# Patient Record
Sex: Male | Born: 1970 | Race: Black or African American | Hispanic: No | Marital: Single | Smoking: Current some day smoker
Health system: Southern US, Community
[De-identification: ages and names within clinical notes are randomized; demographics above are authoritative.]

## PROBLEM LIST (undated history)

## (undated) DIAGNOSIS — G4730 Sleep apnea, unspecified: Secondary | ICD-10-CM

## (undated) DIAGNOSIS — I219 Acute myocardial infarction, unspecified: Secondary | ICD-10-CM

## (undated) DIAGNOSIS — I10 Essential (primary) hypertension: Secondary | ICD-10-CM

## (undated) DIAGNOSIS — I2510 Atherosclerotic heart disease of native coronary artery without angina pectoris: Secondary | ICD-10-CM

## (undated) DIAGNOSIS — Z95810 Presence of automatic (implantable) cardiac defibrillator: Secondary | ICD-10-CM

## (undated) DIAGNOSIS — I639 Cerebral infarction, unspecified: Secondary | ICD-10-CM

## (undated) DIAGNOSIS — M1990 Unspecified osteoarthritis, unspecified site: Secondary | ICD-10-CM

## (undated) DIAGNOSIS — I509 Heart failure, unspecified: Secondary | ICD-10-CM

## (undated) HISTORY — PX: CORONARY ARTERY BYPASS GRAFT: SHX141

---

## 2011-07-11 ENCOUNTER — Emergency Department (HOSPITAL_COMMUNITY): Payer: Medicaid - Out of State

## 2011-07-11 ENCOUNTER — Encounter (HOSPITAL_COMMUNITY): Payer: Self-pay | Admitting: *Deleted

## 2011-07-11 ENCOUNTER — Emergency Department (HOSPITAL_COMMUNITY)
Admission: EM | Admit: 2011-07-11 | Discharge: 2011-07-12 | Disposition: A | Discharge status: Home / Self Care | Payer: Medicaid - Out of State | Attending: Emergency Medicine | Admitting: Emergency Medicine

## 2011-07-11 ENCOUNTER — Other Ambulatory Visit: Payer: Self-pay

## 2011-07-11 DIAGNOSIS — Z95810 Presence of automatic (implantable) cardiac defibrillator: Secondary | ICD-10-CM | POA: Insufficient documentation

## 2011-07-11 DIAGNOSIS — Z794 Long term (current) use of insulin: Secondary | ICD-10-CM | POA: Insufficient documentation

## 2011-07-11 DIAGNOSIS — E7800 Pure hypercholesterolemia, unspecified: Secondary | ICD-10-CM | POA: Insufficient documentation

## 2011-07-11 DIAGNOSIS — I10 Essential (primary) hypertension: Secondary | ICD-10-CM | POA: Insufficient documentation

## 2011-07-11 DIAGNOSIS — R739 Hyperglycemia, unspecified: Secondary | ICD-10-CM

## 2011-07-11 DIAGNOSIS — E1169 Type 2 diabetes mellitus with other specified complication: Secondary | ICD-10-CM | POA: Insufficient documentation

## 2011-07-11 DIAGNOSIS — F17200 Nicotine dependence, unspecified, uncomplicated: Secondary | ICD-10-CM | POA: Insufficient documentation

## 2011-07-11 DIAGNOSIS — R0789 Other chest pain: Secondary | ICD-10-CM | POA: Insufficient documentation

## 2011-07-11 DIAGNOSIS — I2510 Atherosclerotic heart disease of native coronary artery without angina pectoris: Secondary | ICD-10-CM | POA: Insufficient documentation

## 2011-07-11 HISTORY — DX: Atherosclerotic heart disease of native coronary artery without angina pectoris: I25.10

## 2011-07-11 LAB — CBC
Platelets: 173 10*3/uL (ref 150–400)
RDW: 13.3 % (ref 11.5–15.5)
WBC: 9.9 10*3/uL (ref 4.0–10.5)

## 2011-07-11 LAB — COMPREHENSIVE METABOLIC PANEL
ALT: 19 U/L (ref 0–53)
AST: 15 U/L (ref 0–37)
Albumin: 3.8 g/dL (ref 3.5–5.2)
CO2: 27 mEq/L (ref 19–32)
Calcium: 9.4 mg/dL (ref 8.4–10.5)
Chloride: 96 mEq/L (ref 96–112)
GFR calc non Af Amer: >90 mL/min (ref >90)
Sodium: 133 mEq/L — ABNORMAL LOW (ref 135–145)
Total Bilirubin: 0.4 mg/dL (ref 0.3–1.2)

## 2011-07-11 LAB — DIFFERENTIAL
Basophils Absolute: 0 10*3/uL (ref 0.0–0.1)
Basophils Relative: 0 % (ref 0–1)
Lymphocytes Relative: 33 % (ref 12–46)
Neutro Abs: 5.5 10*3/uL (ref 1.7–7.7)

## 2011-07-11 LAB — CK TOTAL AND CKMB (NOT AT ARMC): Relative Index: INVALID (ref 0.0–2.5)

## 2011-07-11 LAB — POCT I-STAT TROPONIN I

## 2011-07-11 MED ORDER — SODIUM CHLORIDE 0.9 % IV BOLUS (SEPSIS)
1000.0000 mL | Freq: Once | INTRAVENOUS | Status: AC
  Administered 2011-07-12: 1000 mL via INTRAVENOUS

## 2011-07-11 MED ORDER — KETOROLAC TROMETHAMINE 30 MG/ML IJ SOLN
30.0000 mg | Freq: Once | INTRAMUSCULAR | Status: AC
  Administered 2011-07-12: 30 mg via INTRAVENOUS
  Filled 2011-07-11: qty 1

## 2011-07-11 MED ORDER — INSULIN ASPART 100 UNIT/ML ~~LOC~~ SOLN
10.0000 [IU] | Freq: Once | SUBCUTANEOUS | Status: AC
  Administered 2011-07-12: 100 [IU] via SUBCUTANEOUS
  Filled 2011-07-11: qty 1

## 2011-07-11 NOTE — ED Provider Notes (Addendum)
History     CSN: [id number]  Arrival date & time 07/11/11  2215   First MD Initiated Contact with Patient 07/11/11 2327      Chief Complaint  Patient presents with  . Chest Pain    (Consider location/radiation/quality/duration/timing/severity/associated sxs/prior treatment) HPI Comments: Pt had hx of severe arrhythmia and was placed in hospital at [facility] in end of 11/12, had defib placed due to arrhythmia and has since done well, no shocks but developed burning senstation in upper L chest 3 weeks ago which he descxribes as constant, 8/10 in intensity, burning in nature, constnat and not associated with exertional sx, SOB, dizzyness, swelling or n/v/f/c/cough.  He has tried OTC meds without reliev.  He has recently moved here.  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  This pain is made worse with movement of the L arm.  Patient is a 41 y.o. male presenting with chest pain. The history is provided by the patient and a relative.  Chest Pain     Past Medical History  Diagnosis Date  . Coronary artery disease   . Defibrillator activation   . Diabetes mellitus     History reviewed. No pertinent past surgical history.  History reviewed. No pertinent family history.  History  Substance Use Topics  . Smoking status: Current Everyday Smoker  . Smokeless tobacco: Not on file  . Alcohol Use: No      Review of Systems  Cardiovascular: Positive for chest pain.  All other systems reviewed and are negative.    Allergies  Review of patient's allergies indicates no known allergies.  Home Medications   Current Outpatient Rx  Name Route Sig Dispense Refill  . ATORVASTATIN CALCIUM 10 MG PO TABS Oral Take 10 mg by mouth at bedtime.    . FUROSEMIDE 80 MG PO TABS Oral Take 80 mg by mouth daily.     . INSULIN ASPART 100 UNIT/ML [place] SOLN Subcutaneous Inject 2.5 Units into the skin 2 (two) times daily. Bases on sliding scale depending on how high glucose level is.    Marland Kitchen LOSARTAN POTASSIUM 50 MG PO TABS Oral Take 50 mg by mouth daily.    Marland Kitchen MAGNESIUM OXIDE 400 MG PO TABS Oral Take 400 mg by mouth 2 (two) times daily.    Marland Kitchen METOPROLOL TARTRATE 25 MG PO TABS Oral Take 25 mg by mouth 2 (two) times daily.    . SERTRALINE HCL 50 MG PO TABS Oral Take 50 mg by mouth at bedtime.      BP 122/79  Pulse 95  Temp(Src) 98.6 F (37 C) (Oral)  Resp 18  Ht 5\' 9"  (1.753 m)  Wt 230 lb (104.327 kg)  BMI 33.96 kg/m2  SpO2 100%  Physical Exam  Nursing note and vitals reviewed. Constitutional: He appears well-developed and well-nourished. No distress.  HENT:  Head: Normocephalic and atraumatic.  Mouth/Throat: Oropharynx is clear and moist. No oropharyngeal exudate.  Eyes: Conjunctivae and EOM are normal. Pupils are equal, round, and reactive to light. Right eye exhibits no discharge. Left eye exhibits no discharge. No scleral icterus.  Neck: Normal range of motion. Neck supple. No JVD present. No thyromegaly present.  Cardiovascular: Normal rate, regular rhythm, normal heart sounds and intact distal pulses.  Exam reveals no gallop and  no friction rub.   No murmur heard. Pulmonary/Chest: Effort normal and breath sounds normal. No respiratory distress. He has no wheezes. He has no rales. He exhibits tenderness ( ttp ove the upper L chest wall, simulates the pain he has had at rest.).  Abdominal: Soft. Bowel sounds are normal. He exhibits no distension and no mass. There is no tenderness.  Musculoskeletal: Normal range of motion. He exhibits no edema and no tenderness.  Lymphadenopathy:    He has no cervical adenopathy.  Neurological: He is alert. Coordination normal.  Skin: Skin is warm and dry. No rash noted. No erythema.  Psychiatric: He has a normal mood and affect. His behavior is normal.    ED  Course  Procedures (including critical care time)  Labs Reviewed  COMPREHENSIVE METABOLIC PANEL - Abnormal; Notable for the following:    Sodium 133 (*)    Glucose, Bld 416 (*)    Alkaline Phosphatase 134 (*)    All other components within normal limits  GLUCOSE, CAPILLARY - Abnormal; Notable for the following:    Glucose-Capillary 127 (*)    All other components within normal limits  CBC  DIFFERENTIAL  CK TOTAL AND CKMB  POCT I-STAT TROPONIN I  I-STAT TROPONIN I   Dg Chest Port 1 View  07/11/2011  *RADIOLOGY REPORT*  Clinical Data: Left-sided chest pain, about the AICD, with burning and tenderness.  PORTABLE CHEST - 1 VIEW  Comparison: None.  Findings: The lungs are well-aerated and clear.  There is no evidence of focal opacification, pleural effusion or pneumothorax.  The cardiomediastinal silhouette is within normal limits.  An AICD is noted overlying the left chest wall, with a single lead ending overlying the right ventricle.  No acute osseous abnormalities are seen.  IMPRESSION:  1.  No acute cardiopulmonary process seen. 2.  AICD grossly unremarkable in appearance on radiograph.  Original Report Authenticated By: Tonia Ghent, M.D.     1. Hyperglycemia   2. Chest wall pain       MDM  Pt reports severe CAD but his sx are more applicable to neuropathic pain at the incision site from the def placement.  There are no other findings on phyical exam, the ECG show some TWA in inferior and lateral precordial leads but no sig ST elevation or depression.  Trop is negative, CBC is normal, CMP with hyperglycemia at 416, no sig AG.  CXR without complications of the AICD.  Consult Cardiology to arrange f/u.    D/w Cardiology fellow who will pass on contact information to help Mr. Jiminez establish local EP care.  He agrees that pt's pain is unlikely to be cardiac in nature, especially with CP X 3 weeks that is burning and worse with palpation of chest wall and movement of the L arm  but no exertional.  Fluids and insulin, pain meds and home.  After intravenous fluids and intravenous insulin, patient has a blood sugar in the 100 range, feels good, discharge appropriate at this time.  Vida Roller, MD 07/12/11 0202  ED ECG REPORT   Date: 07/12/2011   Rate: 102  Rhythm: sinus tachycardia  QRS Axis: normal  Intervals: IVCD at 102  ST/T Wave abnormalities: nonspecific T wave changes  Conduction Disutrbances:nonspecific intraventricular conduction delay  Narrative Interpretation: no old ECG  Old EKG Reviewed: none available   Vida Roller, MD 07/12/11 [phone]

## 2011-07-11 NOTE — ED Notes (Signed)
The pt has lt upper chest pain where he had a defib  Placed nov 27th.  He has had pain around the incision for 2 weeks

## 2011-07-12 LAB — GLUCOSE, CAPILLARY: Glucose-Capillary: 127 mg/dL — ABNORMAL HIGH (ref 70–99)

## 2011-07-12 MED ORDER — ALPRAZOLAM 0.5 MG PO TABS: 0.5000 mg | ORAL_TABLET | Freq: Three times a day (TID) | ORAL | Status: AC | PRN

## 2011-07-12 MED ORDER — HYDROCODONE-ACETAMINOPHEN 5-500 MG PO TABS: 1.0000 | ORAL_TABLET | Freq: Four times a day (QID) | ORAL | Status: AC | PRN

## 2011-07-12 NOTE — ED Notes (Signed)
Pt ambulated to bathroom 

## 2021-09-16 ENCOUNTER — Emergency Department (HOSPITAL_COMMUNITY): Payer: Medicaid Other

## 2021-09-16 ENCOUNTER — Other Ambulatory Visit: Payer: Self-pay

## 2021-09-16 ENCOUNTER — Emergency Department (HOSPITAL_COMMUNITY)
Admission: EM | Admit: 2021-09-16 | Discharge: 2021-09-16 | Disposition: A | Discharge status: Home / Self Care | Payer: Medicaid Other | Attending: Emergency Medicine | Admitting: Emergency Medicine

## 2021-09-16 DIAGNOSIS — E119 Type 2 diabetes mellitus without complications: Secondary | ICD-10-CM | POA: Insufficient documentation

## 2021-09-16 DIAGNOSIS — Z7984 Long term (current) use of oral hypoglycemic drugs: Secondary | ICD-10-CM | POA: Insufficient documentation

## 2021-09-16 DIAGNOSIS — M169 Osteoarthritis of hip, unspecified: Secondary | ICD-10-CM | POA: Insufficient documentation

## 2021-09-16 DIAGNOSIS — Z794 Long term (current) use of insulin: Secondary | ICD-10-CM | POA: Diagnosis not present

## 2021-09-16 DIAGNOSIS — M25551 Pain in right hip: Secondary | ICD-10-CM | POA: Diagnosis present

## 2021-09-16 DIAGNOSIS — Z7982 Long term (current) use of aspirin: Secondary | ICD-10-CM | POA: Diagnosis not present

## 2021-09-16 DIAGNOSIS — Z95810 Presence of automatic (implantable) cardiac defibrillator: Secondary | ICD-10-CM | POA: Diagnosis not present

## 2021-09-16 DIAGNOSIS — M1611 Unilateral primary osteoarthritis, right hip: Secondary | ICD-10-CM

## 2021-09-16 MED ORDER — MORPHINE SULFATE (PF) 4 MG/ML IV SOLN
6.0000 mg | Freq: Once | INTRAVENOUS | Status: AC
  Administered 2021-09-16: 6 mg via INTRAMUSCULAR
  Filled 2021-09-16: qty 2

## 2021-09-16 MED ORDER — ACETAMINOPHEN 500 MG PO TABS
1000.0000 mg | ORAL_TABLET | Freq: Once | ORAL | Status: AC
Start: 2021-09-16 — End: 2021-09-16
  Administered 2021-09-16: 1000 mg via ORAL
  Filled 2021-09-16: qty 2

## 2021-09-16 NOTE — ED Notes (Signed)
Pt walked well from waiting room to exam room. 

## 2021-09-16 NOTE — ED Triage Notes (Signed)
Pt. Stated, when I walk my chest hurts too. ?

## 2021-09-16 NOTE — Discharge Instructions (Signed)
Follow-up with your pain management physician and orthopedics for worsening hip arthritis. ?Return for fevers, leg weakness, unable to ambulate or new concerns. ?Your x-ray showed significant arthritis, no broken bones or dislocation seen. ? ?

## 2021-09-16 NOTE — ED Provider Notes (Signed)
?[facility] ?Provider Note ? ? ?CSN: [id number] ?Arrival date & time: 09/16/21  [id number] ? ?  ? ?History ? ?Chief Complaint  ?Patient presents with  ? Hip Pain  ? ? ?Michael Barnett is a 51 y.o. male. ? ?Patient presents with gradually worsening right hip pain over the past 2 weeks.  Patient is followed closely by cardiology and has defibrillator and is hoping for heart transplant in the near future.  Patient recalls walking longer distance and upstairs when watching a sporting event recently and his pain is gotten significantly worse since then.  No direct trauma or falls.  Patient felt some slipping of his hip however no dislocation.  No fevers or chills.  Mild radiation of pain with movement.  Similar to previous but more significant.  Patient was told he needs a hip replacement.  ? ? ?  ? ?Home Medications ?Prior to Admission medications   ?Medication Sig Start Date End Date Taking? Authorizing Provider  ?aspirin EC 81 MG tablet Take 81 mg by mouth daily. Swallow whole.   Yes [provider]  ?atorvastatin (LIPITOR) 80 MG tablet Take 80 mg by mouth daily.   Yes [provider]  ?DULoxetine (CYMBALTA) 20 MG capsule Take 20 mg by mouth daily.   Yes [provider]  ?empagliflozin (JARDIANCE) 10 MG TABS tablet Take 10 mg by mouth daily.   Yes [provider]  ?ezetimibe (ZETIA) 10 MG tablet Take 10 mg by mouth daily.   Yes [provider]  ?insulin aspart (NOVOLOG) 100 UNIT/ML injection Inject 7 Units into the skin 2 (two) times daily.   Yes [provider]  ?insulin glargine (LANTUS) 100 unit/mL SOPN Inject 30 Units into the skin 2 (two) times daily.   Yes [provider]  ?isosorbide mononitrate (IMDUR) 30 MG 24 hr tablet Take 60 mg by mouth daily.   Yes [provider]  ?methocarbamol (ROBAXIN) 500 MG tablet Take 500 mg by mouth daily as needed for muscle spasms.   Yes [provider]   ?nitroGLYCERIN (NITROSTAT) 0.4 MG SL tablet Place 0.4 mg under the tongue every 5 (five) minutes as needed for chest pain.   Yes [provider]  ?oxyCODONE-acetaminophen (PERCOCET) 10-325 MG tablet Take 1 tablet by mouth daily as needed for pain.   Yes [provider]  ?pantoprazole (PROTONIX) 40 MG tablet Take 40 mg by mouth daily.   Yes [provider]  ?sacubitril-valsartan (ENTRESTO) 24-26 MG Take 1 tablet by mouth 2 (two) times daily.   Yes [provider]  ?sotalol (BETAPACE) 120 MG tablet Take 120 mg by mouth 2 (two) times daily.   Yes [provider]  ?spironolactone (ALDACTONE) 25 MG tablet Take 12.5 mg by mouth daily.   Yes [provider]  ?tamsulosin (FLOMAX) 0.4 MG CAPS capsule Take 0.4 mg by mouth daily.   Yes [provider]  ?torsemide (DEMADEX) 20 MG tablet Take 20 mg by mouth daily as needed (swelling).   Yes [provider]  ?   ? ?Allergies    ?Victoza [liraglutide]   ? ?Review of Systems   ?Review of Systems  ?Constitutional:  Negative for chills and fever.  ?HENT:  Negative for congestion.   ?Eyes:  Negative for visual disturbance.  ?Respiratory:  Negative for shortness of breath.   ?Cardiovascular:  Negative for chest pain.  ?Gastrointestinal:  Negative for abdominal pain and vomiting.  ?Genitourinary:  Negative for dysuria and flank pain.  ?  Musculoskeletal:  Positive for gait problem. Negative for back pain, joint swelling, neck pain and neck stiffness.  ?Skin:  Negative for rash.  ?Neurological:  Negative for light-headedness and headaches.  ? ?Physical Exam ?Updated Vital Signs ?BP 131/77   Pulse 63   Temp 98.1 ?F (36.7 ?C) (Oral)   Resp 16   SpO2 99%  ?Physical Exam ?Vitals and nursing note reviewed.  ?Constitutional:   ?   General: He is not in acute distress. ?   Appearance: He is well-developed.  ?HENT:  ?   Head: Normocephalic.  ?   Mouth/Throat:  ?   Mouth: Mucous membranes are moist.  ?Eyes:  ?   General:      ?   Right eye: No discharge.     ?   Left eye: No discharge.  ?   Conjunctiva/sclera: Conjunctivae normal.  ?Neck:  ?   Trachea: No tracheal deviation.  ?Cardiovascular:  ?   Rate and Rhythm: Normal rate.  ?Pulmonary:  ?   Effort: Pulmonary effort is normal.  ?Abdominal:  ?   General: There is no distension.  ?   Palpations: Abdomen is soft.  ?   Tenderness: There is no abdominal tenderness. There is no guarding.  ?Musculoskeletal:     ?   General: Tenderness present. No swelling.  ?   Cervical back: Normal range of motion and neck supple. No rigidity.  ?   Comments: Patient has tenderness with external rotation and hip flexion on the right.  No other bony tenderness to lower extremity.  Normal strength of flexion extension of hips knees and ankle on the right.  2+ pulses distal right leg.  ?Skin: ?   General: Skin is warm.  ?   Capillary Refill: Capillary refill takes less than 2 seconds.  ?Neurological:  ?   General: No focal deficit present.  ?   Mental Status: He is alert and oriented to person, place, and time.  ?Psychiatric:     ?   Mood and Affect: Mood normal.  ? ? ?ED Results / Procedures / Treatments   ?Labs ?(all labs ordered are listed, but only abnormal results are displayed) ?Labs Reviewed - No data to display ? ?EKG ?None ? ?Radiology ?DG HIP UNILAT WITH PELVIS 2-3 VIEWS RIGHT ? ?Result Date: 09/16/2021 ?CLINICAL DATA:  Right hip pain for 2 weeks. EXAM: DG HIP (WITH OR WITHOUT PELVIS) 2-3V RIGHT COMPARISON:  None. FINDINGS: Moderate right femoroacetabular joint space narrowing. Mild-to-moderate right femoral head-neck junction and peripheral acetabular degenerative osteophytes. On frontal view, similar moderate left femoroacetabular osteoarthritis. The bilateral sacroiliac and pubic symphysis joint spaces are maintained. There are vascular calcifications. Seminal vesicle calcifications are also seen, as can be seen with chronic diabetes. IMPRESSION: Moderate bilateral femoroacetabular osteoarthritis.  Electronically Signed   By: Yvonne Kendall M.D.   On: 09/16/2021 14:05   ? ?Procedures ?Procedures  ? ? ?Medications Ordered in ED ?Medications  ?morphine (PF) 4 MG/ML injection 6 mg (6 mg Intramuscular Given 09/16/21 1411)  ?acetaminophen (TYLENOL) tablet 1,000 mg (1,000 mg Oral Given 09/16/21 1410)  ? ? ?ED Course/ Medical Decision Making/ A&P ?  ?                        ?Medical Decision Making ?Amount and/or Complexity of Data Reviewed ?Radiology: ordered. ? ?Risk ?OTC drugs. ?Prescription drug management. ? ? ?Patient with known significant arthritis and follow-up for future hip replacement presents with worsening acute on  chronic right hip pain.  Patient has a pain medicine physician however sent in for evaluation.  Patient has no clinical signs of infection at this time not concern for osteomyelitis or septic hip.  Patient's story is consistent with acute on chronic worsening/inflammation.  X-ray performed to look for any occult fracture, reviewed independently and no acute dislocation or fracture but does show arthritis.  Pain meds given intramuscular morphine.  Patient has outpatient pain meds.  Patient stable for discharge. ? ? ? ? ? ? ? ?Final Clinical Impression(s) / ED Diagnoses ?Final diagnoses:  ?Osteoarthritis of right hip, unspecified osteoarthritis type  ? ? ?Rx / DC Orders ?ED Discharge Orders   ? ? None  ? ?  ? ? ?  ?Elnora Morrison, MD ?09/16/21 1436 ? ?

## 2021-09-16 NOTE — ED Triage Notes (Signed)
Pt. Stated, Having hip pain 2 weeks ago it flared up. I went to Dr. And they said I needed a hip replacement and the pain is so bad. Last night my hip went completely out.  ?

## 2021-10-16 ENCOUNTER — Emergency Department (HOSPITAL_COMMUNITY)
Admission: EM | Admit: 2021-10-16 | Discharge: 2021-10-16 | Disposition: A | Discharge status: Home / Self Care | Payer: Medicaid Other | Attending: Emergency Medicine | Admitting: Emergency Medicine

## 2021-10-16 ENCOUNTER — Emergency Department (HOSPITAL_COMMUNITY): Payer: Medicaid Other

## 2021-10-16 ENCOUNTER — Encounter (HOSPITAL_COMMUNITY): Payer: Self-pay | Admitting: Emergency Medicine

## 2021-10-16 DIAGNOSIS — Z794 Long term (current) use of insulin: Secondary | ICD-10-CM | POA: Insufficient documentation

## 2021-10-16 DIAGNOSIS — Z951 Presence of aortocoronary bypass graft: Secondary | ICD-10-CM | POA: Insufficient documentation

## 2021-10-16 DIAGNOSIS — Z7982 Long term (current) use of aspirin: Secondary | ICD-10-CM | POA: Diagnosis not present

## 2021-10-16 DIAGNOSIS — I509 Heart failure, unspecified: Secondary | ICD-10-CM | POA: Insufficient documentation

## 2021-10-16 DIAGNOSIS — R079 Chest pain, unspecified: Secondary | ICD-10-CM | POA: Diagnosis present

## 2021-10-16 DIAGNOSIS — R0602 Shortness of breath: Secondary | ICD-10-CM | POA: Insufficient documentation

## 2021-10-16 LAB — URINALYSIS, ROUTINE W REFLEX MICROSCOPIC
Bacteria, UA: NONE SEEN
Bilirubin Urine: NEGATIVE
Glucose, UA: >=500 mg/dL — AB
Ketones, ur: NEGATIVE mg/dL
Leukocytes,Ua: NEGATIVE
Nitrite: NEGATIVE
Protein, ur: NEGATIVE mg/dL
Specific Gravity, Urine: 1.025 (ref 1.005–1.030)
pH: 5 (ref 5.0–8.0)

## 2021-10-16 LAB — BASIC METABOLIC PANEL
Anion gap: 7 (ref 5–15)
BUN: 23 mg/dL — ABNORMAL HIGH (ref 6–20)
CO2: 21 mmol/L — ABNORMAL LOW (ref 22–32)
Calcium: 8.5 mg/dL — ABNORMAL LOW (ref 8.9–10.3)
Chloride: 107 mmol/L (ref 98–111)
Creatinine, Ser: 1.27 mg/dL — ABNORMAL HIGH (ref 0.61–1.24)
GFR, Estimated: >60 mL/min (ref >60)
Glucose, Bld: 257 mg/dL — ABNORMAL HIGH (ref 70–99)
Potassium: 4.2 mmol/L (ref 3.5–5.1)
Sodium: 135 mmol/L (ref 135–145)

## 2021-10-16 LAB — CBC
HCT: 52.1 % — ABNORMAL HIGH (ref 39.0–52.0)
Hemoglobin: 17.2 g/dL — ABNORMAL HIGH (ref 13.0–17.0)
MCH: 30.8 pg (ref 26.0–34.0)
MCHC: 33 g/dL (ref 30.0–36.0)
MCV: 93.2 fL (ref 80.0–100.0)
Platelets: 149 10*3/uL — ABNORMAL LOW (ref 150–400)
RBC: 5.59 MIL/uL (ref 4.22–5.81)
RDW: 13.8 % (ref 11.5–15.5)
WBC: 8.7 10*3/uL (ref 4.0–10.5)
nRBC: 0 % (ref 0.0–0.2)

## 2021-10-16 LAB — TROPONIN I (HIGH SENSITIVITY)
Troponin I (High Sensitivity): 15 ng/L (ref <18)
Troponin I (High Sensitivity): 15 ng/L (ref <18)

## 2021-10-16 LAB — BRAIN NATRIURETIC PEPTIDE: B Natriuretic Peptide: 227.4 pg/mL — ABNORMAL HIGH (ref 0.0–100.0)

## 2021-10-16 NOTE — ED Provider Notes (Signed)
?[facility] ?Provider Note ? ? ?CSN: [id number] ?Arrival date & time: 10/16/21  1849 ? ?  ? ?History ? ?Chief Complaint  ?Patient presents with  ? Chest Pain  ? ? ?Michael Barnett is a 51 y.o. male. ? ? ?Chest Pain ? ?Patient is a 51 year old male with multiple medical history including CABG  2013 and 2016 and currently being evaluated [facility] heart [facility] who presents to the emergency department for progressively worsening shortness of breath for the past week associated with bilateral lower extremity swelling.  Patient report for the past week he has been having palpitation, exertional dyspnea and pillow orthopnea.  He reports he [facility] who informed him to come to the emergency department for evaluation.  He report has been taking his torsemide however he is not having good urine output.  Patient denies associated cough, congestion or fever.  Denies emesis or diarrhea.  He is currently in the work-up of getting a heart transplant and has been trying to lose weight.  His baseline weight is around 240.  He has noticed mild increase in his weight by 5 pounds.  His abdomen is mildly distended and his lower extremities are getting swollen.  Denies any urinary symptoms.  Otherwise no other complaints. ? ? ?Home Medications ?Prior to Admission medications   ?Medication Sig Start Date End Date Taking? Authorizing Provider  ?aspirin EC 81 MG tablet Take 81 mg by mouth daily. Swallow whole.    [provider]  ?atorvastatin (LIPITOR) 80 MG tablet Take 80 mg by mouth daily.    [provider]  ?DULoxetine (CYMBALTA) 20 MG capsule Take 20 mg by mouth daily.    [provider]  ?empagliflozin (JARDIANCE) 10 MG TABS tablet Take 10 mg by mouth daily.    [provider]  ?ezetimibe (ZETIA) 10 MG tablet Take 10 mg by mouth daily.    [provider]  ?insulin aspart (NOVOLOG) 100 UNIT/ML injection  Inject 7 Units into the skin 2 (two) times daily.    [provider]  ?insulin glargine (LANTUS) 100 unit/mL SOPN Inject 30 Units into the skin 2 (two) times daily.    [provider]  ?isosorbide mononitrate (IMDUR) 30 MG 24 hr tablet Take 60 mg by mouth daily.    [provider]  ?methocarbamol (ROBAXIN) 500 MG tablet Take 500 mg by mouth daily as needed for muscle spasms.    [provider]  ?nitroGLYCERIN (NITROSTAT) 0.4 MG SL tablet Place 0.4 mg under the tongue every 5 (five) minutes as needed for chest pain.    [provider]  ?oxyCODONE-acetaminophen (PERCOCET) 10-325 MG tablet Take 1 tablet by mouth daily as needed for pain.    [provider]  ?pantoprazole (PROTONIX) 40 MG tablet Take 40 mg by mouth daily.    [provider]  ?sacubitril-valsartan (ENTRESTO) 24-26 MG Take 1 tablet by mouth 2 (two) times daily.    [provider]  ?sotalol (BETAPACE) 120 MG tablet Take 120 mg by mouth 2 (two) times daily.    [provider]  ?spironolactone (ALDACTONE) 25 MG tablet Take 12.5 mg by mouth daily.    [provider]  ?tamsulosin (FLOMAX) 0.4 MG CAPS capsule Take 0.4 mg by mouth daily.    [provider]  ?torsemide (DEMADEX) 20 MG tablet Take 20 mg by mouth daily as needed (swelling).    [provider]  ?   ? ?  Allergies    ?Victoza [liraglutide]   ? ?Review of Systems   ?Review of Systems  ?Cardiovascular:  Positive for chest pain.  ? ?Physical Exam ?Updated Vital Signs ?BP 127/83   Pulse 68   Temp 98.2 ?F (36.8 ?C) (Oral)   Resp 15   SpO2 100%  ?Physical Exam ?Constitutional:   ?   General: He is not in acute distress. ?   Appearance: He is not ill-appearing or toxic-appearing.  ?HENT:  ?   Head: Normocephalic.  ?Eyes:  ?   Pupils: Pupils are equal, round, and reactive to light.  ?Cardiovascular:  ?   Rate and Rhythm: Normal rate.  ?   Heart sounds:  ?  No friction rub. No gallop.  ?Pulmonary:  ?    Effort: Pulmonary effort is normal. No tachypnea or respiratory distress.  ?   Breath sounds: No wheezing.  ?Chest:  ?   Chest wall: No mass or tenderness.  ?Musculoskeletal:  ?   Cervical back: Normal range of motion.  ?   Right lower leg: Edema present.  ?   Left lower leg: Edema present.  ?Skin: ?   General: Skin is warm.  ?   Capillary Refill: Capillary refill takes less than 2 seconds.  ?Neurological:  ?   General: No focal deficit present.  ?   Mental Status: He is alert.  ?   Cranial Nerves: No cranial nerve deficit.  ?Psychiatric:     ?   Mood and Affect: Mood normal.  ? ? ?ED Results / Procedures / Treatments   ?Labs ?(all labs ordered are listed, but only abnormal results are displayed) ?Labs Reviewed  ?BASIC METABOLIC PANEL - Abnormal; Notable for the following components:  ?    Result Value  ? CO2 21 (*)   ? Glucose, Bld 257 (*)   ? BUN 23 (*)   ? Creatinine, Ser 1.27 (*)   ? Calcium 8.5 (*)   ? All other components within normal limits  ?CBC - Abnormal; Notable for the following components:  ? Hemoglobin 17.2 (*)   ? HCT 52.1 (*)   ? Platelets 149 (*)   ? All other components within normal limits  ?BRAIN NATRIURETIC PEPTIDE - Abnormal; Notable for the following components:  ? B Natriuretic Peptide 227.4 (*)   ? All other components within normal limits  ?URINALYSIS, ROUTINE W REFLEX MICROSCOPIC - Abnormal; Notable for the following components:  ? Glucose, UA >=500 (*)   ? Hgb urine dipstick SMALL (*)   ? All other components within normal limits  ?TROPONIN I (HIGH SENSITIVITY)  ?TROPONIN I (HIGH SENSITIVITY)  ? ? ?EKG ?EKG Interpretation ? ?Date/Time:  Thursday October 16 2021 20:06:05 EDT ?Ventricular Rate:  74 ?PR Interval:  154 ?QRS Duration: 115 ?QT Interval:  411 ?QTC Calculation: 456 ?R Axis:   89 ?Text Interpretation: Sinus rhythm Probable left atrial enlargement Nonspecific intraventricular conduction delay Anterior infarct, old Borderline T abnormalities, inferior leads Confirmed by Coralee Pesa [phone]) on 10/16/2021 8:59:00 PM ? ?Radiology ?DG Chest 1 View ? ?Result Date: 10/16/2021 ?CLINICAL DATA:  Intermittent dizziness, chest pain, left arm numbness for several weeks EXAM: CHEST  1 VIEW COMPARISON:  07/11/2011 FINDINGS: Single frontal view of the chest demonstrates stable single lead AICD. Cardiac silhouette is unremarkable. Postsurgical changes from median sternotomy. No airspace disease, effusion, or pneumothorax. No acute bony abnormality. IMPRESSION: 1. No acute intrathoracic process. Electronically Signed   By: Sharlet Salina M.D.   On: 10/16/2021  20:17   ? ?Procedures ?Procedures  ? ?Medications Ordered in ED ?Medications - No data to display ? ?ED Course/ Medical Decision Making/ A&P ?  ?                        ?Medical Decision Making ? ? ?Patient is a 51 year old male with multiple medical history including CABG  2013 and 2016 and currently being evaluated by [facility] for heart transplant who presents to the emergency department for progressively worsening shortness of breath for the past week associated with bilateral lower extremity swelling.  Vital signs are within the reference range.  No sign of respiratory distress.  Patient oxygenating well on room air.  Physical exam as above. ? ?Patient presentation is concerning for progressively worsening CHF.  Other differential include worsening renal failure versus volume overload from liver damage.  However these are less likely based on his history.  Less concern for acute ACS.  Less concern for viral or other serious bacterial infection including pneumonia at this time. ? ?Patient EKG does show normal sinus rhythm with T wave abnormalities.  He does have right axis deviation.  No ischemic etiology at this time.  His CBC without leukocytosis.  Hemoglobin hematocrit are stable.  His BMP with mild elevation of creatinine at 1.27.  Patient baseline at 0.8.  BNP elevated at 227.  His last BMP on 2/8 per chart review was 376.  Patient  delta troponin is negative.  Patient chest x-ray without acute cardiopulmonary finding.  Reviewed his chest x-ray and did not see a large amount of pleural effusion. ? ?I have discussed patient case with Dr. Zollie Scale

## 2021-10-16 NOTE — ED Triage Notes (Signed)
Patient here with complaint of intermittent dizziness, chest pain with left arm numbness that started a few weeks ago. History of heart failure, states he is on transplant list. Patient is alert, oriented, ambulatory, and in no apparent distress at this time. ?

## 2021-10-16 NOTE — Discharge Instructions (Signed)
You have been evaluated for chest pain.  Your work-up was grossly unremarkable aside from mild exacerbation of your heart failure. ? ?I have spoke with Dr. Debroah Baller at [facility] who recommended increasing your torsemide to 60 mg for 2 days.  You will get a call within 2 days for follow-up and repeat lab work-up. ? ?Please follow-up with your [facility] doctor for further recommendation. ? ?If you start having worsening shortness of breath or chest pain please come back to the emergency department. ?

## 2021-11-01 ENCOUNTER — Encounter (HOSPITAL_COMMUNITY): Payer: Self-pay

## 2021-11-01 ENCOUNTER — Other Ambulatory Visit: Payer: Self-pay

## 2021-11-01 ENCOUNTER — Emergency Department (HOSPITAL_COMMUNITY): Payer: Medicaid Other

## 2021-11-01 ENCOUNTER — Emergency Department (HOSPITAL_COMMUNITY)
Admission: EM | Admit: 2021-11-01 | Discharge: 2021-11-01 | Disposition: A | Discharge status: Home / Self Care | Payer: Medicaid Other | Attending: Emergency Medicine | Admitting: Emergency Medicine

## 2021-11-01 DIAGNOSIS — Z79899 Other long term (current) drug therapy: Secondary | ICD-10-CM | POA: Insufficient documentation

## 2021-11-01 DIAGNOSIS — E1140 Type 2 diabetes mellitus with diabetic neuropathy, unspecified: Secondary | ICD-10-CM

## 2021-11-01 DIAGNOSIS — R197 Diarrhea, unspecified: Secondary | ICD-10-CM

## 2021-11-01 DIAGNOSIS — I10 Essential (primary) hypertension: Secondary | ICD-10-CM | POA: Diagnosis not present

## 2021-11-01 DIAGNOSIS — Z951 Presence of aortocoronary bypass graft: Secondary | ICD-10-CM

## 2021-11-01 DIAGNOSIS — I2510 Atherosclerotic heart disease of native coronary artery without angina pectoris: Secondary | ICD-10-CM | POA: Insufficient documentation

## 2021-11-01 DIAGNOSIS — M5116 Intervertebral disc disorders with radiculopathy, lumbar region: Secondary | ICD-10-CM

## 2021-11-01 DIAGNOSIS — Z794 Long term (current) use of insulin: Secondary | ICD-10-CM | POA: Insufficient documentation

## 2021-11-01 DIAGNOSIS — R109 Unspecified abdominal pain: Secondary | ICD-10-CM | POA: Diagnosis present

## 2021-11-01 DIAGNOSIS — K36 Other appendicitis: Secondary | ICD-10-CM | POA: Diagnosis not present

## 2021-11-01 DIAGNOSIS — F17200 Nicotine dependence, unspecified, uncomplicated: Secondary | ICD-10-CM | POA: Insufficient documentation

## 2021-11-01 DIAGNOSIS — Z95810 Presence of automatic (implantable) cardiac defibrillator: Secondary | ICD-10-CM | POA: Insufficient documentation

## 2021-11-01 DIAGNOSIS — R1084 Generalized abdominal pain: Secondary | ICD-10-CM

## 2021-11-01 DIAGNOSIS — Z7984 Long term (current) use of oral hypoglycemic drugs: Secondary | ICD-10-CM | POA: Diagnosis not present

## 2021-11-01 DIAGNOSIS — Z7982 Long term (current) use of aspirin: Secondary | ICD-10-CM | POA: Diagnosis not present

## 2021-11-01 LAB — URINALYSIS, ROUTINE W REFLEX MICROSCOPIC
Bacteria, UA: NONE SEEN
Bilirubin Urine: NEGATIVE
Glucose, UA: >=500 mg/dL — AB
Ketones, ur: NEGATIVE mg/dL
Leukocytes,Ua: NEGATIVE
Nitrite: NEGATIVE
Protein, ur: NEGATIVE mg/dL
Specific Gravity, Urine: 1.025 (ref 1.005–1.030)
pH: 5 (ref 5.0–8.0)

## 2021-11-01 LAB — COMPREHENSIVE METABOLIC PANEL
ALT: 51 U/L — ABNORMAL HIGH (ref 0–44)
AST: 29 U/L (ref 15–41)
Albumin: 3.5 g/dL (ref 3.5–5.0)
Alkaline Phosphatase: 120 U/L (ref 38–126)
Anion gap: 7 (ref 5–15)
BUN: 25 mg/dL — ABNORMAL HIGH (ref 6–20)
CO2: 20 mmol/L — ABNORMAL LOW (ref 22–32)
Calcium: 8.5 mg/dL — ABNORMAL LOW (ref 8.9–10.3)
Chloride: 106 mmol/L (ref 98–111)
Creatinine, Ser: 1.49 mg/dL — ABNORMAL HIGH (ref 0.61–1.24)
GFR, Estimated: 56 mL/min — ABNORMAL LOW (ref >60)
Glucose, Bld: 313 mg/dL — ABNORMAL HIGH (ref 70–99)
Potassium: 4.4 mmol/L (ref 3.5–5.1)
Sodium: 133 mmol/L — ABNORMAL LOW (ref 135–145)
Total Bilirubin: 0.7 mg/dL (ref 0.3–1.2)
Total Protein: 6 g/dL — ABNORMAL LOW (ref 6.5–8.1)

## 2021-11-01 LAB — CBC WITH DIFFERENTIAL/PLATELET
Abs Immature Granulocytes: 0.03 10*3/uL (ref 0.00–0.07)
Basophils Absolute: 0.1 10*3/uL (ref 0.0–0.1)
Basophils Relative: 1 %
Eosinophils Absolute: 0.1 10*3/uL (ref 0.0–0.5)
Eosinophils Relative: 1 %
HCT: 52.1 % — ABNORMAL HIGH (ref 39.0–52.0)
Hemoglobin: 17.3 g/dL — ABNORMAL HIGH (ref 13.0–17.0)
Immature Granulocytes: 0 %
Lymphocytes Relative: 28 %
Lymphs Abs: 2.2 10*3/uL (ref 0.7–4.0)
MCH: 30.9 pg (ref 26.0–34.0)
MCHC: 33.2 g/dL (ref 30.0–36.0)
MCV: 93 fL (ref 80.0–100.0)
Monocytes Absolute: 0.7 10*3/uL (ref 0.1–1.0)
Monocytes Relative: 9 %
Neutro Abs: 4.5 10*3/uL (ref 1.7–7.7)
Neutrophils Relative %: 61 %
Platelets: 128 10*3/uL — ABNORMAL LOW (ref 150–400)
RBC: 5.6 MIL/uL (ref 4.22–5.81)
RDW: 13.3 % (ref 11.5–15.5)
WBC: 7.6 10*3/uL (ref 4.0–10.5)
nRBC: 0 % (ref 0.0–0.2)

## 2021-11-01 LAB — LIPASE, BLOOD
Lipase: 30 U/L (ref 11–51)
Lipase: 27 U/L (ref 11–51)

## 2021-11-01 MED ORDER — DIPHENHYDRAMINE HCL 50 MG/ML IJ SOLN
25.0000 mg | Freq: Once | INTRAMUSCULAR | Status: AC
  Administered 2021-11-01: 25 mg via INTRAVENOUS
  Filled 2021-11-01: qty 1

## 2021-11-01 MED ORDER — LIDOCAINE HCL (PF) 1 % IJ SOLN
5.0000 mL | Freq: Once | INTRAMUSCULAR | Status: AC
  Administered 2021-11-01: 5 mL via INTRADERMAL
  Filled 2021-11-01: qty 5

## 2021-11-01 MED ORDER — ONDANSETRON 4 MG PO TBDP: 4.0000 mg | ORAL_TABLET | Freq: Three times a day (TID) | ORAL | 0 refills | Status: DC | PRN

## 2021-11-01 MED ORDER — IOHEXOL 300 MG/ML  SOLN
100.0000 mL | Freq: Once | INTRAMUSCULAR | Status: AC | PRN
  Administered 2021-11-01: 100 mL via INTRAVENOUS

## 2021-11-01 MED ORDER — SODIUM CHLORIDE 0.9 % IV BOLUS
1000.0000 mL | Freq: Once | INTRAVENOUS | Status: AC
  Administered 2021-11-01: 1000 mL via INTRAVENOUS

## 2021-11-01 MED ORDER — ONDANSETRON HCL 4 MG/2ML IJ SOLN
4.0000 mg | Freq: Once | INTRAMUSCULAR | Status: AC
  Administered 2021-11-01: 4 mg via INTRAVENOUS
  Filled 2021-11-01: qty 2

## 2021-11-01 MED ORDER — DROPERIDOL 2.5 MG/ML IJ SOLN
0.6250 mg | Freq: Once | INTRAMUSCULAR | Status: AC
  Administered 2021-11-01: 0.625 mg via INTRAVENOUS
  Filled 2021-11-01: qty 2

## 2021-11-01 MED ORDER — DROPERIDOL 2.5 MG/ML IJ SOLN: 1.2500 mg | Freq: Once | INTRAMUSCULAR | Status: DC

## 2021-11-01 MED ORDER — FENTANYL CITRATE PF 50 MCG/ML IJ SOSY
50.0000 ug | PREFILLED_SYRINGE | Freq: Once | INTRAMUSCULAR | Status: AC
  Administered 2021-11-01: 50 ug via INTRAVENOUS
  Filled 2021-11-01: qty 1

## 2021-11-01 NOTE — Discharge Instructions (Addendum)
You have been provided the contact information for the for [facility] gastroenterology.  Please call and schedule an appointment as soon as possible, preferably within the next 2 to 3 days for follow-up and continued medical management. ? ?Schedule a follow-up appointment with your [facility] within the next 2 to 3 days for follow-up and continued medical management.  ? ?Continue with your pain management follow-ups and your PCP follow-up as discussed. ? ?Return to the ED for new or worsening symptoms as discussed. ?

## 2021-11-01 NOTE — ED Triage Notes (Signed)
Pt has history of pancreatitis and is having another flare up. Abdominal pain is in the center of his stomach. Pt having nausea vomiting and diarrhea. Started 4 days ago ?

## 2021-11-01 NOTE — ED Provider Notes (Signed)
?[facility] ?Provider Note ? ? ?CSN: [id number] ?Arrival date & time: 11/01/21  0119 ? ?  ? ?History ? ?Chief Complaint  ?Patient presents with  ? Abdominal Pain  ? ? ?Michael Barnett is a 51 y.o. male with chief complaint of abdominal pain, nausea, but no vomiting.  Also complaining of lower back pain with radiation to the right leg and right leg weakness.  Per patient history, is currently being evaluated by [facility] for possible back surgery.  Is convinced his pain is from pancreatitis, as he has had multiple episodes of acute/chronic pancreatitis.  Endorses increased diarrhea over the last week or so, stating he is able to feel the sensation that he has to go, but sometimes is unable to make it to the bathroom in time.  Denies any urinary symptoms or difficulty with urination.  Pain described as constant, across his stomach, feels like "someone is cutting into the stomach with razor blades".  Currently being evaluated for a heart transplant.  Has had his gallbladder assessed in the past, had issues with it, and was told that he could not get the surgery because he is trying to get a heart transplant.  Hx pancreatitis, CAD, diabetes mellitus, chronic tobacco use, defibrillator placement, OSA, HTN, hyperlipidemia, ischemic cardiomyopathy, anxiety, depression, GERD. ? ?The history is provided by the patient and medical records.  ?Abdominal Pain ?Associated symptoms: diarrhea and nausea   ? ?  ? ?Home Medications ?Prior to Admission medications   ?Medication Sig Start Date End Date Taking? Authorizing Provider  ?aspirin EC 81 MG tablet Take 81 mg by mouth daily. Swallow whole.    [provider]  ?atorvastatin (LIPITOR) 80 MG tablet Take 80 mg by mouth daily.    [provider]  ?DULoxetine (CYMBALTA) 20 MG capsule Take 20 mg by mouth daily.    [provider]  ?empagliflozin (JARDIANCE) 10 MG TABS tablet Take 10 mg by mouth daily.    [provider]  ?ezetimibe (ZETIA) 10 MG tablet Take 10 mg by mouth daily.    [provider]  ?insulin aspart (NOVOLOG) 100 UNIT/ML injection Inject 7 Units into the skin 2 (two) times daily.    [provider]  ?insulin glargine (LANTUS) 100 unit/mL SOPN Inject 30 Units into the skin 2 (two) times daily.    [provider]  ?isosorbide mononitrate (IMDUR) 30 MG 24 hr tablet Take 60 mg by mouth daily.    [provider]  ?methocarbamol (ROBAXIN) 500 MG tablet Take 500 mg by mouth daily as needed for muscle spasms.    [provider]  ?nitroGLYCERIN (NITROSTAT) 0.4 MG SL tablet Place 0.4 mg under the tongue every 5 (five) minutes as needed for chest pain.    [provider]  ?oxyCODONE-acetaminophen (PERCOCET) 10-325 MG tablet Take 1 tablet by mouth daily as needed for pain.    [provider]  ?pantoprazole (PROTONIX) 40 MG tablet Take 40 mg by mouth daily.    [provider]  ?sacubitril-valsartan (ENTRESTO) 24-26 MG Take 1 tablet by mouth 2 (two) times daily.    [provider]  ?sotalol (BETAPACE) 120 MG tablet Take 120 mg by mouth 2 (two) times daily.    [provider]  ?spironolactone (ALDACTONE) 25 MG tablet Take 12.5 mg by mouth daily.    [provider]  ?tamsulosin (FLOMAX) 0.4 MG CAPS capsule Take 0.4 mg by mouth daily.    [provider]  ?torsemide [facility])  20 MG tablet Take 20 mg by mouth daily as needed (swelling).    [provider]  ?   ? ?Allergies    ?Victoza [liraglutide]   ? ?Review of Systems   ?Review of Systems  ?Gastrointestinal:  Positive for abdominal pain, diarrhea and nausea.  ?Musculoskeletal:  Positive for back pain.  ?     Right leg pain/weakness, numbness of bilateral feet  ? ?Physical Exam ?Updated Vital Signs ?BP 135/67   Pulse 72   Resp 14   Ht 5\' 9"  (1.753 m)   Wt 111.1 kg   SpO2 100%   BMI 36.18 kg/m?  ?Physical Exam ?Vitals and nursing note reviewed.   ?Constitutional:   ?   General: He is not in acute distress. ?   Appearance: He is well-developed. He is not ill-appearing or diaphoretic.  ?HENT:  ?   Head: Normocephalic and atraumatic.  ?Eyes:  ?   Conjunctiva/sclera: Conjunctivae normal.  ?Cardiovascular:  ?   Rate and Rhythm: Normal rate and regular rhythm.  ?   Heart sounds: Normal heart sounds. No murmur heard. ?Pulmonary:  ?   Effort: Pulmonary effort is normal. No respiratory distress.  ?   Breath sounds: Normal breath sounds.  ?Abdominal:  ?   General: Bowel sounds are normal.  ?   Palpations: Abdomen is soft.  ?   Tenderness: There is generalized abdominal tenderness and tenderness in the right lower quadrant. There is no guarding. Negative signs include McBurney's sign.  ?Musculoskeletal:     ?   General: No swelling.  ?   Cervical back: Neck supple.  ?   Comments: Bilateral SI tenderness, tenderness of the right hip joint specifically the femoral head/neck, mild lumbar tenderness.  ?Skin: ?   General: Skin is warm and dry.  ?   Capillary Refill: Capillary refill takes less than 2 seconds.  ?   Coloration: Skin is not cyanotic or pale.  ?Neurological:  ?   Mental Status: He is alert and oriented to person, place, and time. Mental status is at baseline.  ?   GCS: GCS eye subscore is 4. GCS verbal subscore is 5. GCS motor subscore is 6.  ?   Cranial Nerves: No dysarthria or facial asymmetry.  ?   Motor: No pronator drift.  ?   Coordination: Heel to Shin Test abnormal. Finger-Nose-Finger Test normal.  ?   Comments: Subjective decreased sensation of bilateral feet.  Subjective increased tenderness and weakness of right thigh.  No saddle anesthesia  ?Psychiatric:     ?   Mood and Affect: Mood normal.  ? ? ?ED Results / Procedures / Treatments   ?Labs ?(all labs ordered are listed, but only abnormal results are displayed) ?Labs Reviewed  ?COMPREHENSIVE METABOLIC PANEL - Abnormal; Notable for the following components:  ?    Result Value  ? Sodium 133 (*)   ?  CO2 20 (*)   ? Glucose, Bld 313 (*)   ? BUN 25 (*)   ? Creatinine, Ser 1.49 (*)   ? Calcium 8.5 (*)   ? Total Protein 6.0 (*)   ? ALT 51 (*)   ? GFR, Estimated 56 (*)   ? All other components within normal limits  ?CBC WITH DIFFERENTIAL/PLATELET - Abnormal; Notable for the following components:  ? Hemoglobin 17.3 (*)   ? HCT 52.1 (*)   ? Platelets 128 (*)   ? All other components within normal limits  ?URINALYSIS, ROUTINE W REFLEX MICROSCOPIC - Abnormal;  Notable for the following components:  ? Color, Urine STRAW (*)   ? Glucose, UA >=500 (*)   ? Hgb urine dipstick SMALL (*)   ? All other components within normal limits  ?LIPASE, BLOOD  ?LIPASE, BLOOD  ? ? ?EKG ?None ? ?Radiology ?CT Abdomen Pelvis W Contrast ? ?Result Date: 11/01/2021 ?CLINICAL DATA:  Abdominal pain EXAM: CT ABDOMEN AND PELVIS WITH CONTRAST TECHNIQUE: Multidetector CT imaging of the abdomen and pelvis was performed using the standard protocol following bolus administration of intravenous contrast. RADIATION DOSE REDUCTION: This exam was performed according to the departmental dose-optimization program which includes automated exposure control, adjustment of the mA and/or kV according to patient size and/or use of iterative reconstruction technique. CONTRAST:  19mL OMNIPAQUE IOHEXOL 300 MG/ML  SOLN COMPARISON:  None Available. FINDINGS: Lower chest: No acute abnormality. Hepatobiliary: No focal liver abnormality is seen. No gallstones, gallbladder wall thickening, or biliary dilatation. Pancreas: Unremarkable. No pancreatic ductal dilatation or surrounding inflammatory changes. Spleen: Normal in size without focal abnormality. Adrenals/Urinary Tract: Kidneys enhance symmetrically. No hydronephrosis. Renal vascular calcifications with no definite nephrolithiasis. Nonspecific symmetric perirenal fat stranding. Bladder is unremarkable. Stomach/Bowel: Stomach is within normal limits. Appendix appears normal. No evidence of bowel wall thickening,  distention, or inflammatory changes. Vascular/Lymphatic: Aortic atherosclerosis. No enlarged abdominal or pelvic lymph nodes. Reproductive: Prostatomegaly. Other: Small fat containing right inguinal hernia. No free intraperito

## 2021-12-21 ENCOUNTER — Other Ambulatory Visit: Payer: Self-pay

## 2021-12-21 ENCOUNTER — Emergency Department (HOSPITAL_COMMUNITY): Payer: Medicaid Other

## 2021-12-21 ENCOUNTER — Encounter (HOSPITAL_COMMUNITY): Payer: Self-pay | Admitting: Emergency Medicine

## 2021-12-21 ENCOUNTER — Emergency Department (HOSPITAL_COMMUNITY)
Admission: EM | Admit: 2021-12-21 | Discharge: 2021-12-21 | Disposition: A | Discharge status: Home / Self Care | Payer: Medicaid Other | Attending: Emergency Medicine | Admitting: Emergency Medicine

## 2021-12-21 DIAGNOSIS — I110 Hypertensive heart disease with heart failure: Secondary | ICD-10-CM | POA: Diagnosis not present

## 2021-12-21 DIAGNOSIS — Z7982 Long term (current) use of aspirin: Secondary | ICD-10-CM | POA: Insufficient documentation

## 2021-12-21 DIAGNOSIS — I2510 Atherosclerotic heart disease of native coronary artery without angina pectoris: Secondary | ICD-10-CM | POA: Insufficient documentation

## 2021-12-21 DIAGNOSIS — M7989 Other specified soft tissue disorders: Secondary | ICD-10-CM | POA: Insufficient documentation

## 2021-12-21 DIAGNOSIS — E119 Type 2 diabetes mellitus without complications: Secondary | ICD-10-CM | POA: Insufficient documentation

## 2021-12-21 DIAGNOSIS — R0602 Shortness of breath: Secondary | ICD-10-CM | POA: Diagnosis not present

## 2021-12-21 DIAGNOSIS — I509 Heart failure, unspecified: Secondary | ICD-10-CM

## 2021-12-21 DIAGNOSIS — Z794 Long term (current) use of insulin: Secondary | ICD-10-CM | POA: Diagnosis not present

## 2021-12-21 DIAGNOSIS — Z79899 Other long term (current) drug therapy: Secondary | ICD-10-CM | POA: Insufficient documentation

## 2021-12-21 DIAGNOSIS — R55 Syncope and collapse: Secondary | ICD-10-CM | POA: Insufficient documentation

## 2021-12-21 HISTORY — DX: Heart failure, unspecified: I50.9

## 2021-12-21 LAB — CBC
HCT: 49.2 % (ref 39.0–52.0)
Hemoglobin: 16 g/dL (ref 13.0–17.0)
MCH: 30.8 pg (ref 26.0–34.0)
MCHC: 32.5 g/dL (ref 30.0–36.0)
MCV: 94.6 fL (ref 80.0–100.0)
Platelets: 133 10*3/uL — ABNORMAL LOW (ref 150–400)
RBC: 5.2 MIL/uL (ref 4.22–5.81)
RDW: 14 % (ref 11.5–15.5)
WBC: 6.8 10*3/uL (ref 4.0–10.5)
nRBC: 0 % (ref 0.0–0.2)

## 2021-12-21 LAB — BASIC METABOLIC PANEL
Anion gap: 7 (ref 5–15)
BUN: 20 mg/dL (ref 6–20)
CO2: 25 mmol/L (ref 22–32)
Calcium: 8.4 mg/dL — ABNORMAL LOW (ref 8.9–10.3)
Chloride: 105 mmol/L (ref 98–111)
Creatinine, Ser: 1.26 mg/dL — ABNORMAL HIGH (ref 0.61–1.24)
GFR, Estimated: >60 mL/min (ref >60)
Glucose, Bld: 227 mg/dL — ABNORMAL HIGH (ref 70–99)
Potassium: 4.2 mmol/L (ref 3.5–5.1)
Sodium: 137 mmol/L (ref 135–145)

## 2021-12-21 LAB — TROPONIN I (HIGH SENSITIVITY)
Troponin I (High Sensitivity): 16 ng/L (ref <18)
Troponin I (High Sensitivity): 15 ng/L (ref <18)

## 2021-12-21 LAB — HEPATIC FUNCTION PANEL
ALT: 25 U/L (ref 0–44)
AST: 21 U/L (ref 15–41)
Albumin: 3.1 g/dL — ABNORMAL LOW (ref 3.5–5.0)
Alkaline Phosphatase: 114 U/L (ref 38–126)
Bilirubin, Direct: 0.2 mg/dL (ref 0.0–0.2)
Indirect Bilirubin: 0.5 mg/dL (ref 0.3–0.9)
Total Bilirubin: 0.7 mg/dL (ref 0.3–1.2)
Total Protein: 5.7 g/dL — ABNORMAL LOW (ref 6.5–8.1)

## 2021-12-21 LAB — BRAIN NATRIURETIC PEPTIDE: B Natriuretic Peptide: 525.7 pg/mL — ABNORMAL HIGH (ref 0.0–100.0)

## 2021-12-21 MED ORDER — FUROSEMIDE 10 MG/ML IJ SOLN
40.0000 mg | Freq: Once | INTRAMUSCULAR | Status: AC
Start: 2021-12-21 — End: 2021-12-21
  Administered 2021-12-21: 40 mg via INTRAVENOUS
  Filled 2021-12-21: qty 4

## 2021-12-21 MED ORDER — FUROSEMIDE 10 MG/ML IJ SOLN: 20.0000 mg | Freq: Once | INTRAMUSCULAR | Status: DC

## 2021-12-21 MED ORDER — FUROSEMIDE 8 MG/ML PO SOLN: 40.0000 mg | Freq: Once | ORAL | Status: DC

## 2021-12-21 NOTE — ED Triage Notes (Signed)
Patient reports feeling lightheaded /feet swelling and edema with mild SOB this week , history of CHF , takes oral diuretic.

## 2022-06-01 ENCOUNTER — Emergency Department (HOSPITAL_COMMUNITY)
Admission: EM | Admit: 2022-06-01 | Discharge: 2022-06-02 | Discharge status: Against Medical Advice | Payer: Medicaid Other | Attending: Emergency Medicine | Admitting: Emergency Medicine

## 2022-06-01 ENCOUNTER — Encounter (HOSPITAL_COMMUNITY): Payer: Self-pay

## 2022-06-01 ENCOUNTER — Emergency Department (HOSPITAL_COMMUNITY): Payer: Medicaid Other

## 2022-06-01 ENCOUNTER — Other Ambulatory Visit: Payer: Self-pay

## 2022-06-01 DIAGNOSIS — R209 Unspecified disturbances of skin sensation: Secondary | ICD-10-CM | POA: Insufficient documentation

## 2022-06-01 DIAGNOSIS — Z5321 Procedure and treatment not carried out due to patient leaving prior to being seen by health care provider: Secondary | ICD-10-CM | POA: Diagnosis not present

## 2022-06-01 DIAGNOSIS — R002 Palpitations: Secondary | ICD-10-CM | POA: Insufficient documentation

## 2022-06-01 DIAGNOSIS — R0789 Other chest pain: Secondary | ICD-10-CM | POA: Diagnosis present

## 2022-06-01 LAB — CBC WITH DIFFERENTIAL/PLATELET
Abs Immature Granulocytes: 0.01 10*3/uL (ref 0.00–0.07)
Basophils Absolute: 0.1 10*3/uL (ref 0.0–0.1)
Basophils Relative: 1 %
Eosinophils Absolute: 0.2 10*3/uL (ref 0.0–0.5)
Eosinophils Relative: 3 %
HCT: 50.8 % (ref 39.0–52.0)
Hemoglobin: 17.2 g/dL — ABNORMAL HIGH (ref 13.0–17.0)
Immature Granulocytes: 0 %
Lymphocytes Relative: 37 %
Lymphs Abs: 2.1 10*3/uL (ref 0.7–4.0)
MCH: 31.7 pg (ref 26.0–34.0)
MCHC: 33.9 g/dL (ref 30.0–36.0)
MCV: 93.6 fL (ref 80.0–100.0)
Monocytes Absolute: 0.6 10*3/uL (ref 0.1–1.0)
Monocytes Relative: 11 %
Neutro Abs: 2.8 10*3/uL (ref 1.7–7.7)
Neutrophils Relative %: 48 %
Platelets: 108 10*3/uL — ABNORMAL LOW (ref 150–400)
RBC: 5.43 MIL/uL (ref 4.22–5.81)
RDW: 13.8 % (ref 11.5–15.5)
WBC: 5.7 10*3/uL (ref 4.0–10.5)
nRBC: 0 % (ref 0.0–0.2)

## 2022-06-01 LAB — BASIC METABOLIC PANEL
Anion gap: 7 (ref 5–15)
BUN: 16 mg/dL (ref 6–20)
CO2: 23 mmol/L (ref 22–32)
Calcium: 8.4 mg/dL — ABNORMAL LOW (ref 8.9–10.3)
Chloride: 106 mmol/L (ref 98–111)
Creatinine, Ser: 1.01 mg/dL (ref 0.61–1.24)
GFR, Estimated: >60 mL/min (ref >60)
Glucose, Bld: 121 mg/dL — ABNORMAL HIGH (ref 70–99)
Potassium: 4 mmol/L (ref 3.5–5.1)
Sodium: 136 mmol/L (ref 135–145)

## 2022-06-01 LAB — TROPONIN I (HIGH SENSITIVITY): Troponin I (High Sensitivity): 12 ng/L (ref <18)

## 2022-06-01 MED ORDER — ASPIRIN 81 MG PO CHEW
324.0000 mg | CHEWABLE_TABLET | Freq: Once | ORAL | Status: AC
  Administered 2022-06-01: 324 mg via ORAL
  Filled 2022-06-01: qty 4

## 2022-06-01 NOTE — ED Provider Triage Note (Signed)
Emergency Medicine Provider Triage Evaluation Note  Michael Barnett , a 51 y.o. male  was evaluated in triage.  Pt complains of chest pain.  Patient states that he was lying on his couch earlier tonight and began having left-sided chest heaviness and was diaphoretic.  He states that he has taken 3 nitroglycerin without any improvement in his symptoms.  He states that he feels mildly short of breath.  Symptoms are worse with exertion.  States that he has a history of CHF and has a defibrillator.  Has not felt his defibrillator fire.  He is being considered for heart transplant..  Review of Systems  Positive: See above Negative:   Physical Exam  BP 128/77 (BP Location: Right Arm)   Pulse (!) 58   Temp 98.4 F (36.9 C) (Oral)   Resp 17   Ht 5\' 9"  (1.753 m)   Wt 117.9 kg   SpO2 97%   BMI 38.40 kg/m  Gen:   Awake, no distress   Resp:  Normal effort  MSK:   Moves extremities without difficulty  Other:  S1/S2 without murmur  Medical Decision Making  Medically screening exam initiated at 9:05 PM.  Appropriate orders placed.  Michael Barnett was informed that the remainder of the evaluation will be completed by another provider, this initial triage assessment does not replace that evaluation, and the importance of remaining in the ED until their evaluation is complete.     Charlesetta Shanks, PA-C 06/01/22 2106

## 2022-06-01 NOTE — ED Triage Notes (Signed)
Pt reports left sided sharp chest pain that started yesterday, heart palpitations and bilateral hand numbness. He reports he took 4 sublingual nitro tabs at home prior to arrival. He reports hx of heart surgery and is awaiting a heart transplant.

## 2022-06-01 NOTE — ED Notes (Signed)
Patient transported to X-ray 

## 2022-06-02 NOTE — ED Notes (Signed)
Attempted to call pt x2 for repeat blood work with no answer. Will attempt again shortly.

## 2022-06-02 NOTE — ED Notes (Signed)
Pt called x3 for room, still no response. Moving pt OTF. 

## 2022-06-02 NOTE — ED Notes (Addendum)
Pt called x3 for vitals recheck, no response.  

## 2023-05-12 ENCOUNTER — Emergency Department (HOSPITAL_COMMUNITY): Payer: Medicaid Other

## 2023-05-12 ENCOUNTER — Other Ambulatory Visit: Payer: Self-pay

## 2023-05-12 ENCOUNTER — Emergency Department (HOSPITAL_COMMUNITY)
Admission: EM | Admit: 2023-05-12 | Discharge: 2023-05-13 | Disposition: A | Discharge status: Home / Self Care | Payer: Medicaid Other | Attending: Emergency Medicine | Admitting: Emergency Medicine

## 2023-05-12 ENCOUNTER — Encounter (HOSPITAL_COMMUNITY): Payer: Self-pay

## 2023-05-12 DIAGNOSIS — I2510 Atherosclerotic heart disease of native coronary artery without angina pectoris: Secondary | ICD-10-CM | POA: Insufficient documentation

## 2023-05-12 DIAGNOSIS — Z794 Long term (current) use of insulin: Secondary | ICD-10-CM | POA: Insufficient documentation

## 2023-05-12 DIAGNOSIS — E119 Type 2 diabetes mellitus without complications: Secondary | ICD-10-CM | POA: Insufficient documentation

## 2023-05-12 DIAGNOSIS — M79672 Pain in left foot: Secondary | ICD-10-CM | POA: Diagnosis present

## 2023-05-12 DIAGNOSIS — Z7982 Long term (current) use of aspirin: Secondary | ICD-10-CM | POA: Insufficient documentation

## 2023-05-12 DIAGNOSIS — I509 Heart failure, unspecified: Secondary | ICD-10-CM | POA: Diagnosis not present

## 2023-05-12 MED ORDER — DOXYCYCLINE HYCLATE 100 MG PO CAPS: 100.0000 mg | ORAL_CAPSULE | Freq: Two times a day (BID) | ORAL | 0 refills | Status: DC

## 2023-05-12 MED ORDER — KETOROLAC TROMETHAMINE 15 MG/ML IJ SOLN
15.0000 mg | Freq: Once | INTRAMUSCULAR | Status: AC
  Administered 2023-05-12: 15 mg via INTRAMUSCULAR
  Filled 2023-05-12: qty 1

## 2023-05-12 NOTE — Discharge Instructions (Addendum)
Please use the provided shoe for stability.  Take the prescribed antibiotics for soft tissue infection.  Follow-up with your primary care team and orthopedics for further evaluation as needed.

## 2023-05-12 NOTE — ED Notes (Signed)
Pt c/o L leg pain. Pt states he broke L  great toe recently and not been able to walk since, Pt reports pain/numbness/discoloration of the toe that radiates up the leg.

## 2023-05-12 NOTE — ED Provider Notes (Signed)
[facility] EMERGENCY DEPARTMENT AT [facility] Provider Note   CSN: [id number] Arrival date & time: 05/12/23  2055     History  Chief Complaint  Patient presents with   Foot Pain    Michael Barnett is a 52 y.o. male.  Patient with past medical history significant for coronary artery disease, type II DM, neuropathy, CHF, chronic pain presents to the emergency department complaining of left great toe pain with radiation to the left lower leg.  This problem has been ongoing since August of this year since the patient stubbed his toe.  He was evaluated at an outside emergency department who stated they thought he had a fracture of the great toe of the left foot and placed him in some sort of [place] shoe.  The patient states that she did not fit properly and that his toes hung off the end of the shoe.  He states he follow-up with orthopedics out of this area who had no further recommendations.  He is here at the emergency department seeking a second opinion due to ongoing pain.  He denies any new injuries or falls.   Foot Pain       Home Medications Prior to Admission medications   Medication Sig Start Date End Date Taking? Authorizing Provider  aspirin EC 81 MG tablet Take 81 mg by mouth daily. Swallow whole.    [provider]  atorvastatin (LIPITOR) 80 MG tablet Take 80 mg by mouth daily.    [provider]  doxycycline (VIBRAMYCIN) 100 MG capsule Take 1 capsule (100 mg total) by mouth 2 (two) times daily. 05/12/23   Darrick Grinder, PA-C  DULoxetine (CYMBALTA) 20 MG capsule Take 20 mg by mouth daily.    [provider]  empagliflozin (JARDIANCE) 10 MG TABS tablet Take 10 mg by mouth daily.    [provider]  insulin aspart (NOVOLOG) 100 UNIT/ML injection Inject 12 Units into the skin 2 (two) times daily.    [provider]  insulin glargine (LANTUS) 100 unit/mL SOPN Inject 30 Units into the skin 2 (two) times daily.     [provider]  isosorbide mononitrate (IMDUR) 30 MG 24 hr tablet Take 60 mg by mouth daily.    [provider]  methocarbamol (ROBAXIN) 500 MG tablet Take 500 mg by mouth daily as needed for muscle spasms.    [provider]  metoprolol succinate (TOPROL-XL) 100 MG 24 hr tablet Take 100 mg by mouth daily. 10/27/21   [provider]  nitroGLYCERIN (NITROSTAT) 0.4 MG SL tablet Place 0.4 mg under the tongue every 5 (five) minutes as needed for chest pain.    [provider]  ondansetron (ZOFRAN-ODT) 4 MG disintegrating tablet Take 1 tablet (4 mg total) by mouth every 8 (eight) hours as needed for nausea or vomiting. 11/01/21   Kommor, [place], MD  oxyCODONE-acetaminophen (PERCOCET) 10-325 MG tablet Take 1 tablet by mouth daily as needed for pain.    [provider]  pantoprazole (PROTONIX) 40 MG tablet Take 40 mg by mouth daily.    [provider]  sacubitril-valsartan (ENTRESTO) 24-26 MG Take 1 tablet by mouth 2 (two) times daily.    [provider]  sotalol (BETAPACE) 120 MG tablet Take 120 mg by mouth 2 (two) times daily.    [provider]  spironolactone (ALDACTONE) 25 MG tablet Take 12.5 mg by mouth daily.    [provider]  tamsulosin (FLOMAX) 0.4 MG CAPS capsule Take  0.4 mg by mouth daily.    [provider]  torsemide (DEMADEX) 20 MG tablet Take 20 mg by mouth daily as needed (swelling).    [provider]      Allergies    Victoza [liraglutide]    Review of Systems   Review of Systems  Physical Exam Updated Vital Signs BP (!) 147/78 (BP Location: Right Arm)   Pulse 74   Temp 97.7 F (36.5 C) (Oral)   Resp 17   Ht 5\' 9"  (1.753 m)   Wt 104.3 kg   SpO2 100%   BMI 33.97 kg/m  Physical Exam Vitals and nursing note reviewed.  HENT:     Head: Normocephalic and atraumatic.  Eyes:     Pupils: Pupils are equal, round, and reactive to light.  Pulmonary:     Effort: Pulmonary  effort is normal. No respiratory distress.  Musculoskeletal:        General: Swelling and tenderness present. No deformity or signs of injury. Normal range of motion.     Cervical back: Normal range of motion.     Comments: Grossly normal range of motion of the left great toe.  Patient complains of pain with active range of motion.  Complaints of tenderness to palpation.  Mild swelling noted to the medial edge of the great toe.  No ulceration noted.  Brisk cap refill.  Sensation intact.  Skin:    General: Skin is dry.  Neurological:     Mental Status: He is alert.  Psychiatric:        Speech: Speech normal.        Behavior: Behavior normal.     ED Results / Procedures / Treatments   Labs (all labs ordered are listed, but only abnormal results are displayed) Labs Reviewed - No data to display  EKG None  Radiology DG Foot Complete Left  Result Date: 05/12/2023 CLINICAL DATA:  Left great toe pain and swelling EXAM: LEFT FOOT - COMPLETE 3+ VIEW COMPARISON:  None Available. FINDINGS: No acute fracture or dislocation. Mild soft tissue swelling about the big toe. IMPRESSION: No acute fracture or dislocation. Electronically Signed   By: Minerva Fester M.D.   On: 05/12/2023 21:44    Procedures Procedures    Medications Ordered in ED Medications  ketorolac (TORADOL) 15 MG/ML injection 15 mg (15 mg Intramuscular Given 05/12/23 2334)    ED Course/ Medical Decision Making/ A&P                                 Medical Decision Making Amount and/or Complexity of Data Reviewed Radiology: ordered.  Risk Prescription drug management.   This patient presents to the ED for concern of toe pain, this involves an extensive number of treatment options, and is a complaint that carries with it a high risk of complications and morbidity.  The differential diagnosis includes fracture, dislocation, soft tissue injury, others   Co morbidities that complicate the patient evaluation  Type II  DM   Additional history obtained:   External records from outside source obtained and reviewed including emergency department notes from August of this year from [facility].  Radiology read did not show definite fracture at that time, thoughts of abnormality may be due to artifact.  Imaging Studies ordered:  I ordered imaging studies including Plain films of the left foot  I independently visualized and interpreted imaging which showed No acute fracture or  dislocation, mild soft tissue swelling I agree with the radiologist interpretation   Problem List / ED Course / Critical interventions / Medication management   I ordered medication including Toradol for inflammation Reevaluation of the patient after these medicines showed that the patient improved I have reviewed the patients home medicines and have made adjustments as needed   Social Determinants of Health:  Social Determinants of Health with Concerns   Tobacco Use: High Risk (05/12/2023)   Patient History    Smoking Tobacco Use: Every Day    Smokeless Tobacco Use: Unknown    Passive Exposure: Not on file  Physical Activity: Not on file  Stress: Not on file  Social Connections: Not on file  Depression (ZOX0-9): Not on file  Alcohol Screen: Not on file  Housing: Not on file  Health Literacy: Not on file      Test / Admission - Considered:  Patient with no fracture or dislocation on imaging.  Patient with tenderness and some mild soft tissue swelling.  Concerns for possible early infection/cellulitis.  Will discharge on course of doxycycline for antibiotic coverage.  Patient placed a new postop shoe which is appropriately sized for patient.  Orthopedic follow-up information provided.  Discharged home.         Final Clinical Impression(s) / ED Diagnoses Final diagnoses:  Foot pain, left    Rx / DC Orders ED Discharge Orders          Ordered    doxycycline (VIBRAMYCIN) 100 MG capsule  2 times daily,    Status:  Discontinued        05/12/23 2314    doxycycline (VIBRAMYCIN) 100 MG capsule  2 times daily        05/12/23 2341              Pamala Duffel 05/12/23 2353    Pricilla Loveless, MD 05/21/23 1355

## 2023-05-24 ENCOUNTER — Ambulatory Visit (INDEPENDENT_AMBULATORY_CARE_PROVIDER_SITE_OTHER): Payer: Medicaid Other | Admitting: Podiatry

## 2023-05-24 ENCOUNTER — Encounter: Payer: Self-pay | Admitting: Podiatry

## 2023-05-24 DIAGNOSIS — B351 Tinea unguium: Secondary | ICD-10-CM

## 2023-05-24 DIAGNOSIS — M79674 Pain in right toe(s): Secondary | ICD-10-CM

## 2023-05-24 DIAGNOSIS — E1142 Type 2 diabetes mellitus with diabetic polyneuropathy: Secondary | ICD-10-CM | POA: Diagnosis not present

## 2023-05-24 DIAGNOSIS — L97521 Non-pressure chronic ulcer of other part of left foot limited to breakdown of skin: Secondary | ICD-10-CM | POA: Diagnosis not present

## 2023-05-24 DIAGNOSIS — M79675 Pain in left toe(s): Secondary | ICD-10-CM

## 2023-05-24 DIAGNOSIS — L84 Corns and callosities: Secondary | ICD-10-CM | POA: Diagnosis not present

## 2023-05-24 NOTE — Progress Notes (Signed)
  Subjective:  Patient ID: Michael Barnett, male    DOB: [birth year],   MRN: [id number]  No chief complaint on file.   52 y.o. male presents for concern of thickened elongated and painful nails that are difficult to trim. Requesting to have them trimmed today. Relates burning and tingling in their feet. Patient is diabetic and last A1c was at one point 12 but relates it has improved. His big concern today in pain in his left great toe. Relates three months ago he stubbed it and was told it was broken. Relates he has continued to have pain and was seen in urgent care recently and X-rays were taken and negative for fracture and was advised to follow-up.   PCP:  [facility]    . Denies any other pedal complaints. Denies n/v/f/c.   Past Medical History:  Diagnosis Date   CHF (congestive heart failure) (HCC)    Coronary artery disease    Defibrillator activation    Diabetes mellitus     Objective:  Physical Exam: Vascular: DP/PT pulses non palpable bilateral. CFT <3 seconds. Absent hair growth on digits. Edema noted to bilateral lower extremities. Xerosis noted bilaterally.  Skin. No lacerations or abrasions bilateral feet. Nails 1-5 bilateral  are thickened discolored and elongated with subungual debris. Hyperkeratotic lesions noted sub fourth metatarsal on left. Dorsal medial left hallux with hyperkeratosis upon debridement noted small ulceration about 0.1 cm x 0.1 cm x 0.1 cm with some mild darkening around the skin. No erythema or edema not purulence of probe to bone.  Musculoskeletal: MMT 5/5 bilateral lower extremities in DF, PF, Inversion and Eversion. Deceased ROM in DF of ankle joint.  Neurological: Sensation intact to light touch. Protective sensation diminished bilateral.    Assessment:   1. Pain due to onychomycosis of toenails of both feet   2. Type 2 diabetes mellitus with peripheral neuropathy (HCC)      Plan:  Patient was evaluated and treated and all  questions answered. -Discussed and educated patient on diabetic foot care, especially with  regards to the vascular, neurological and musculoskeletal systems.  -Stressed the importance of good glycemic control and the detriment of not  controlling glucose levels in relation to the foot. -Discussed supportive shoes at all times and checking feet regularly.  -Mechanically debrided all nails 1-5 bilateral using sterile nail nipper and filed with dremel without incident  -Hyperkeratotic tissue debrided on plantar right foot with chisel without incident.  Ulcer dorsal left great toe limited to breakdown of skin  -Debridement as below. Limited.  -Concern for blood flow in the area and possible arterial wound.  -ABIs ordered.  -Dressed with neosporin, DSD. -No abx indicated.  -Discussed glucose control and proper protein-rich diet.  -Discussed if any worsening redness, pain, fever or chills to call or may need to report to the emergency room. Patient expressed understanding.   Procedure: Excisional Debridement of Wound Rationale: Removal of non-viable soft tissue from the wound to promote healing.  Anesthesia: none Pre-Debridement Wound Measurements: Overlying callus  Post-Debridement Wound Measurements: 0.1 cm x 0.1 cm x 0.1 cm  Type of Debridement: Sharp Excisional Tissue Removed: Non-viable soft tissue Depth of Debridement: subcutaneous tissue. Technique: Sharp excisional debridement to bleeding, viable wound base.  Dressing: Dry, sterile, compression dressing. Disposition: Patient tolerated procedure well. Patient to return in 2 week for follow-up.  Return in about 2 weeks (around 06/07/2023) for wound check.   Louann Sjogren, DPM

## 2023-05-26 ENCOUNTER — Ambulatory Visit (HOSPITAL_COMMUNITY)
Admission: RE | Admit: 2023-05-26 | Discharge: 2023-05-26 | Disposition: A | Discharge status: Home / Self Care | Payer: Medicaid Other | Source: Ambulatory Visit | Attending: Podiatry | Admitting: Podiatry

## 2023-05-26 ENCOUNTER — Other Ambulatory Visit: Payer: Self-pay | Admitting: Podiatry

## 2023-05-26 DIAGNOSIS — E1142 Type 2 diabetes mellitus with diabetic polyneuropathy: Secondary | ICD-10-CM

## 2023-05-26 DIAGNOSIS — L97521 Non-pressure chronic ulcer of other part of left foot limited to breakdown of skin: Secondary | ICD-10-CM | POA: Diagnosis present

## 2023-05-26 LAB — VAS US ABI WITH/WO TBI
Left ABI: 0.76
Right ABI: 0.75

## 2023-06-01 ENCOUNTER — Ambulatory Visit (INDEPENDENT_AMBULATORY_CARE_PROVIDER_SITE_OTHER): Payer: Medicaid Other | Admitting: Vascular Surgery

## 2023-06-01 ENCOUNTER — Other Ambulatory Visit: Payer: Self-pay

## 2023-06-01 ENCOUNTER — Encounter: Payer: Self-pay | Admitting: Vascular Surgery

## 2023-06-01 VITALS — BP 122/72 | HR 62 | Temp 98.2°F | Resp 20 | Ht 69.0 in | Wt 232.0 lb

## 2023-06-01 DIAGNOSIS — I70245 Atherosclerosis of native arteries of left leg with ulceration of other part of foot: Secondary | ICD-10-CM | POA: Diagnosis not present

## 2023-06-01 NOTE — H&P (View-Only) (Signed)
 [facility] [place]  ASSESSMENT / PLAN: Michael Barnett is a 52 y.o. male with atherosclerosis of native arteries of left lower extremity causing ulceration.  Patient counseled patients with chronic limb threatening ischemia have an annual risk of cardiovascular mortality of 25% and a high risk of amputation.   WIfI score calculated based on clinical exam and non-invasive measurements. 2 / 1 / 0. Stage III.   Recommend:  Abstinence from all tobacco products. Blood glucose control with goal A1c < 7%. Blood pressure control with goal blood pressure < 140/90 mmHg. Lipid reduction therapy with goal LDL-C <70   Aspirin 81mg  PO QD.  Atorvastatin 40-80mg  PO QD (or other "high intensity" statin therapy).  Plan left lower extremity angiogram with possible intervention via right common femoral approach in cath lab as soon as possible.   CHIEF COMPLAINT: left great toe ulcer  HISTORY OF PRESENT ILLNESS: [name] is a 52 y.o. male referred to clinic for evaluation of left great toe ulcer by Dr. Ralene Cork.  The patient has a strong personal history of atherosclerotic disease.  He had coronary artery bypass grafting in [place] several years ago.  He is a diabetic with poorly controlled diabetes.  He reports a left great toe injury several months ago with persistent pain.  He was noted to have a small ulcer on his left great toe by Dr. Ralene Cork during her evaluation about a week ago, and referred for further management.   Past Medical History:  Diagnosis Date   CHF (congestive heart failure) (HCC)    Coronary artery disease    Defibrillator activation    Diabetes mellitus     Past Surgical History:  Procedure Laterality Date   CORONARY ARTERY BYPASS GRAFT      History reviewed. No pertinent family history.  Social History   Socioeconomic History   Marital status: Single    Spouse name: Not on file   Number of children: Not on file   Years of education: Not on  file   Highest education level: Not on file  Occupational History   Not on file  Tobacco Use   Smoking status: Every Day   Smokeless tobacco: Not on file  Substance and Sexual Activity   Alcohol use: No   Drug use: Not on file   Sexual activity: Not on file  Other Topics Concern   Not on file  Social History Narrative   Not on file   Social Determinants of Health   Financial Resource Strain: Low Risk  (03/25/2023)   Received from [facility] [employer] Strain (CARDIA)    Difficulty of Paying Living Expenses: Not hard at all  Food Insecurity: No Food Insecurity (03/25/2023)   Received from [facility] [employer] Vital Sign    Worried About Running Out of Food in the Last Year: Never true    Ran Out of Food in the Last Year: Never true  Transportation Needs: No Transportation Needs (03/25/2023)   Received from [facility] [employer] of Transportation (Medical): No    Lack of Transportation (Non-Medical): No  Physical Activity: Not on file  Stress: Not on file  Social Connections: Not on file  Intimate Partner Violence: Not At Risk (03/25/2023)   Received from [facility], Afraid, Rape, and Kick questionnaire    Fear of Current or Ex-Partner: No  Emotionally Abused: No    Physically Abused: No    Sexually Abused: No    Allergies  Allergen Reactions   Victoza [Liraglutide] Other (See Comments)    Gave pt pancreatitis    Current Outpatient Medications  Medication Sig Dispense Refill   aspirin EC 81 MG tablet Take 81 mg by mouth daily. Swallow whole.     atorvastatin (LIPITOR) 80 MG tablet Take 80 mg by mouth daily.     doxycycline (VIBRAMYCIN) 100 MG capsule Take 1 capsule (100 mg total) by mouth 2 (two) times daily. 20 capsule 0   DULoxetine (CYMBALTA) 20 MG capsule Take 20 mg by mouth daily.     empagliflozin (JARDIANCE) 10 MG TABS tablet Take 10  mg by mouth daily.     insulin aspart (NOVOLOG) 100 UNIT/ML injection Inject 12 Units into the skin 2 (two) times daily.     insulin glargine (LANTUS) 100 unit/mL SOPN Inject 30 Units into the skin 2 (two) times daily.     isosorbide mononitrate (IMDUR) 30 MG 24 hr tablet Take 60 mg by mouth daily.     methocarbamol (ROBAXIN) 500 MG tablet Take 500 mg by mouth daily as needed for muscle spasms.     metoprolol succinate (TOPROL-XL) 100 MG 24 hr tablet Take 100 mg by mouth daily.     nitroGLYCERIN (NITROSTAT) 0.4 MG SL tablet Place 0.4 mg under the tongue every 5 (five) minutes as needed for chest pain.     ondansetron (ZOFRAN-ODT) 4 MG disintegrating tablet Take 1 tablet (4 mg total) by mouth every 8 (eight) hours as needed for nausea or vomiting. 20 tablet 0   oxyCODONE-acetaminophen (PERCOCET) 10-325 MG tablet Take 1 tablet by mouth daily as needed for pain.     pantoprazole (PROTONIX) 40 MG tablet Take 40 mg by mouth daily.     sacubitril-valsartan (ENTRESTO) 24-26 MG Take 1 tablet by mouth 2 (two) times daily.     sotalol (BETAPACE) 120 MG tablet Take 120 mg by mouth 2 (two) times daily.     spironolactone (ALDACTONE) 25 MG tablet Take 12.5 mg by mouth daily.     tamsulosin (FLOMAX) 0.4 MG CAPS capsule Take 0.4 mg by mouth daily.     torsemide (DEMADEX) 20 MG tablet Take 20 mg by mouth daily as needed (swelling).     No current facility-administered medications for this visit.    PHYSICAL EXAM Vitals:   06/01/23 0938  BP: 122/72  Pulse: 62  Resp: 20  Temp: 98.2 F (36.8 C)  SpO2: 95%  Weight: 232 lb (105.2 kg)  Height: 5\' 9"  (1.753 m)   Middle-age man in no distress Regular rate and rhythm Unlabored breathing No palpable pedal pulses Small left medial great toe ulcer with clean base  PERTINENT LABORATORY AND RADIOLOGIC DATA  Most recent CBC    Latest Ref Rng & Units 06/01/2022    9:13 PM 12/21/2021    8:47 PM 11/01/2021    1:41 AM  CBC  WBC 4.0 - 10.5 K/uL 5.7  6.8  7.6    Hemoglobin 13.0 - 17.0 g/dL 40.9  81.1  91.4   Hematocrit 39.0 - 52.0 % 50.8  49.2  52.1   Platelets 150 - 400 K/uL 108  133  128      Most recent CMP    Latest Ref Rng & Units 06/01/2022    9:13 PM 12/21/2021    8:47 PM 11/01/2021    1:41 AM  CMP  Glucose 70 -  99 mg/dL 045  409  811   BUN 6 - 20 mg/dL 16  20  25    Creatinine 0.61 - 1.24 mg/dL 9.14  7.82  9.56   Sodium 135 - 145 mmol/L 136  137  133   Potassium 3.5 - 5.1 mmol/L 4.0  4.2  4.4   Chloride 98 - 111 mmol/L 106  105  106   CO2 22 - 32 mmol/L 23  25  20    Calcium 8.9 - 10.3 mg/dL 8.4  8.4  8.5   Total Protein 6.5 - 8.1 g/dL  5.7  6.0   Total Bilirubin 0.3 - 1.2 mg/dL  0.7  0.7   Alkaline Phos 38 - 126 U/L  114  120   AST 15 - 41 U/L  21  29   ALT 0 - 44 U/L  25  51     +-------+-----------+-----------+------------+------------+  ABI/TBIToday's ABIToday's TBIPrevious ABIPrevious TBI  +-------+-----------+-----------+------------+------------+  Right 0.75       0.41                                 +-------+-----------+-----------+------------+------------+  Left  0.76       0.42                                 +-------+-----------+-----------+------------+------------+   Rande Brunt. Lenell Antu, MD FACS [facility] Phone Number: [phone] 06/01/2023 10:09 AM   Total time spent on preparing this encounter including chart review, data review, collecting history, examining the patient, coordinating care for this new patient, 60 minutes.  Portions of this report may have been transcribed using voice recognition software.  Every effort has been made to ensure accuracy; however, inadvertent computerized transcription errors may still be present.

## 2023-06-01 NOTE — H&P (View-Only) (Signed)
 [facility] [place]  ASSESSMENT / PLAN: Michael Barnett is a 52 y.o. male with atherosclerosis of native arteries of left lower extremity causing ulceration.  Patient counseled patients with chronic limb threatening ischemia have an annual risk of cardiovascular mortality of 25% and a high risk of amputation.   WIfI score calculated based on clinical exam and non-invasive measurements. 2 / 1 / 0. Stage III.   Recommend:  Abstinence from all tobacco products. Blood glucose control with goal A1c < 7%. Blood pressure control with goal blood pressure < 140/90 mmHg. Lipid reduction therapy with goal LDL-C <70   Aspirin 81mg  PO QD.  Atorvastatin 40-80mg  PO QD (or other "high intensity" statin therapy).  Plan left lower extremity angiogram with possible intervention via right common femoral approach in cath lab as soon as possible.   CHIEF COMPLAINT: left great toe ulcer  HISTORY OF PRESENT ILLNESS: Michael Barnett is a 52 y.o. male referred to clinic for evaluation of left great toe ulcer by Dr. Ralene Cork.  The patient has a strong personal history of atherosclerotic disease.  He had coronary artery bypass grafting in [place] several years ago.  He is a diabetic with poorly controlled diabetes.  He reports a left great toe injury several months ago with persistent pain.  He was noted to have a small ulcer on his left great toe by Dr. Ralene Cork during her evaluation about a week ago, and referred for further management.   Past Medical History:  Diagnosis Date   CHF (congestive heart failure) (HCC)    Coronary artery disease    Defibrillator activation    Diabetes mellitus     Past Surgical History:  Procedure Laterality Date   CORONARY ARTERY BYPASS GRAFT      History reviewed. No pertinent family history.  Social History   Socioeconomic History   Marital status: Single    Spouse name: Not on file   Number of children: Not on file   Years of education: Not on  file   Highest education level: Not on file  Occupational History   Not on file  Tobacco Use   Smoking status: Every Day   Smokeless tobacco: Not on file  Substance and Sexual Activity   Alcohol use: No   Drug use: Not on file   Sexual activity: Not on file  Other Topics Concern   Not on file  Social History Narrative   Not on file   Social Determinants of Health   Financial Resource Strain: Low Risk  (03/25/2023)   Received from [facility] [employer] Strain (CARDIA)    Difficulty of Paying Living Expenses: Not hard at all  Food Insecurity: No Food Insecurity (03/25/2023)   Received from [facility] [employer] Vital Sign    Worried About Running Out of Food in the Last Year: Never true    Ran Out of Food in the Last Year: Never true  Transportation Needs: No Transportation Needs (03/25/2023)   Received from [facility] [place] [facility] [employer] of Transportation (Medical): No    Lack of Transportation (Non-Medical): No  Physical Activity: Not on file  Stress: Not on file  Social Connections: Not on file  Intimate Partner Violence: Not At Risk (03/25/2023)   Received from [facility], Afraid, Rape, and Kick questionnaire    Fear of Current or Ex-Partner: No  Emotionally Abused: No    Physically Abused: No    Sexually Abused: No    Allergies  Allergen Reactions   Victoza [Liraglutide] Other (See Comments)    Gave pt pancreatitis    Current Outpatient Medications  Medication Sig Dispense Refill   aspirin EC 81 MG tablet Take 81 mg by mouth daily. Swallow whole.     atorvastatin (LIPITOR) 80 MG tablet Take 80 mg by mouth daily.     doxycycline (VIBRAMYCIN) 100 MG capsule Take 1 capsule (100 mg total) by mouth 2 (two) times daily. 20 capsule 0   DULoxetine (CYMBALTA) 20 MG capsule Take 20 mg by mouth daily.     empagliflozin (JARDIANCE) 10 MG TABS tablet Take 10  mg by mouth daily.     insulin aspart (NOVOLOG) 100 UNIT/ML injection Inject 12 Units into the skin 2 (two) times daily.     insulin glargine (LANTUS) 100 unit/mL SOPN Inject 30 Units into the skin 2 (two) times daily.     isosorbide mononitrate (IMDUR) 30 MG 24 hr tablet Take 60 mg by mouth daily.     methocarbamol (ROBAXIN) 500 MG tablet Take 500 mg by mouth daily as needed for muscle spasms.     metoprolol succinate (TOPROL-XL) 100 MG 24 hr tablet Take 100 mg by mouth daily.     nitroGLYCERIN (NITROSTAT) 0.4 MG SL tablet Place 0.4 mg under the tongue every 5 (five) minutes as needed for chest pain.     ondansetron (ZOFRAN-ODT) 4 MG disintegrating tablet Take 1 tablet (4 mg total) by mouth every 8 (eight) hours as needed for nausea or vomiting. 20 tablet 0   oxyCODONE-acetaminophen (PERCOCET) 10-325 MG tablet Take 1 tablet by mouth daily as needed for pain.     pantoprazole (PROTONIX) 40 MG tablet Take 40 mg by mouth daily.     sacubitril-valsartan (ENTRESTO) 24-26 MG Take 1 tablet by mouth 2 (two) times daily.     sotalol (BETAPACE) 120 MG tablet Take 120 mg by mouth 2 (two) times daily.     spironolactone (ALDACTONE) 25 MG tablet Take 12.5 mg by mouth daily.     tamsulosin (FLOMAX) 0.4 MG CAPS capsule Take 0.4 mg by mouth daily.     torsemide (DEMADEX) 20 MG tablet Take 20 mg by mouth daily as needed (swelling).     No current facility-administered medications for this visit.    PHYSICAL EXAM Vitals:   06/01/23 0938  BP: 122/72  Pulse: 62  Resp: 20  Temp: 98.2 F (36.8 C)  SpO2: 95%  Weight: 232 lb (105.2 kg)  Height: 5\' 9"  (1.753 m)   Middle-age man in no distress Regular rate and rhythm Unlabored breathing No palpable pedal pulses Small left medial great toe ulcer with clean base  PERTINENT LABORATORY AND RADIOLOGIC DATA  Most recent CBC    Latest Ref Rng & Units 06/01/2022    9:13 PM 12/21/2021    8:47 PM 11/01/2021    1:41 AM  CBC  WBC 4.0 - 10.5 K/uL 5.7  6.8  7.6    Hemoglobin 13.0 - 17.0 g/dL 40.9  81.1  91.4   Hematocrit 39.0 - 52.0 % 50.8  49.2  52.1   Platelets 150 - 400 K/uL 108  133  128      Most recent CMP    Latest Ref Rng & Units 06/01/2022    9:13 PM 12/21/2021    8:47 PM 11/01/2021    1:41 AM  CMP  Glucose 70 -  99 mg/dL 045  409  811   BUN 6 - 20 mg/dL 16  20  25    Creatinine 0.61 - 1.24 mg/dL 9.14  7.82  9.56   Sodium 135 - 145 mmol/L 136  137  133   Potassium 3.5 - 5.1 mmol/L 4.0  4.2  4.4   Chloride 98 - 111 mmol/L 106  105  106   CO2 22 - 32 mmol/L 23  25  20    Calcium 8.9 - 10.3 mg/dL 8.4  8.4  8.5   Total Protein 6.5 - 8.1 g/dL  5.7  6.0   Total Bilirubin 0.3 - 1.2 mg/dL  0.7  0.7   Alkaline Phos 38 - 126 U/L  114  120   AST 15 - 41 U/L  21  29   ALT 0 - 44 U/L  25  51     +-------+-----------+-----------+------------+------------+  ABI/TBIToday's ABIToday's TBIPrevious ABIPrevious TBI  +-------+-----------+-----------+------------+------------+  Right 0.75       0.41                                 +-------+-----------+-----------+------------+------------+  Left  0.76       0.42                                 +-------+-----------+-----------+------------+------------+   Rande Brunt. Lenell Antu, MD FACS [facility] Phone Number: [phone] 06/01/2023 10:09 AM   Total time spent on preparing this encounter including chart review, data review, collecting history, examining the patient, coordinating care for this new patient, 60 minutes.  Portions of this report may have been transcribed using voice recognition software.  Every effort has been made to ensure accuracy; however, inadvertent computerized transcription errors may still be present.

## 2023-06-01 NOTE — Progress Notes (Signed)
[facility] [place]  ASSESSMENT / PLAN: Michael Barnett is a 52 y.o. male with atherosclerosis of native arteries of left lower extremity causing ulceration.  Patient counseled patients with chronic limb threatening ischemia have an annual risk of cardiovascular mortality of 25% and a high risk of amputation.   WIfI score calculated based on clinical exam and non-invasive measurements. 2 / 1 / 0. Stage III.   Recommend:  Abstinence from all tobacco products. Blood glucose control with goal A1c < 7%. Blood pressure control with goal blood pressure < 140/90 mmHg. Lipid reduction therapy with goal LDL-C <70   Aspirin 81mg  PO QD.  Atorvastatin 40-80mg  PO QD (or other "high intensity" statin therapy).  Plan left lower extremity angiogram with possible intervention via right common femoral approach in cath lab as soon as possible.   CHIEF COMPLAINT: left great toe ulcer  HISTORY OF PRESENT ILLNESS: Michael Barnett is a 52 y.o. male referred to clinic for evaluation of left great toe ulcer by Dr. Ralene Cork.  The patient has a strong personal history of atherosclerotic disease.  He had coronary artery bypass grafting in [place] several years ago.  He is a diabetic with poorly controlled diabetes.  He reports a left great toe injury several months ago with persistent pain.  He was noted to have a small ulcer on his left great toe by Dr. Ralene Cork during her evaluation about a week ago, and referred for further management.   Past Medical History:  Diagnosis Date   CHF (congestive heart failure) (HCC)    Coronary artery disease    Defibrillator activation    Diabetes mellitus     Past Surgical History:  Procedure Laterality Date   CORONARY ARTERY BYPASS GRAFT      History reviewed. No pertinent family history.  Social History   Socioeconomic History   Marital status: Single    Spouse name: Not on file   Number of children: Not on file   Years of education: Not on  file   Highest education level: Not on file  Occupational History   Not on file  Tobacco Use   Smoking status: Every Day   Smokeless tobacco: Not on file  Substance and Sexual Activity   Alcohol use: No   Drug use: Not on file   Sexual activity: Not on file  Other Topics Concern   Not on file  Social History Narrative   Not on file   Social Determinants of Health   Financial Resource Strain: Low Risk  (03/25/2023)   Received from [facility] [employer] Strain (CARDIA)    Difficulty of Paying Living Expenses: Not hard at all  Food Insecurity: No Food Insecurity (03/25/2023)   Received from [facility] [employer] Vital Sign    Worried About Running Out of Food in the Last Year: Never true    Ran Out of Food in the Last Year: Never true  Transportation Needs: No Transportation Needs (03/25/2023)   Received from [facility] [employer] of Transportation (Medical): No    Lack of Transportation (Non-Medical): No  Physical Activity: Not on file  Stress: Not on file  Social Connections: Not on file  Intimate Partner Violence: Not At Risk (03/25/2023)   Received from [facility] [employer], Afraid, Rape, and Kick questionnaire    Fear of Current or Ex-Partner: No  Emotionally Abused: No    Physically Abused: No    Sexually Abused: No    Allergies  Allergen Reactions   Victoza [Liraglutide] Other (See Comments)    Gave pt pancreatitis    Current Outpatient Medications  Medication Sig Dispense Refill   aspirin EC 81 MG tablet Take 81 mg by mouth daily. Swallow whole.     atorvastatin (LIPITOR) 80 MG tablet Take 80 mg by mouth daily.     doxycycline (VIBRAMYCIN) 100 MG capsule Take 1 capsule (100 mg total) by mouth 2 (two) times daily. 20 capsule 0   DULoxetine (CYMBALTA) 20 MG capsule Take 20 mg by mouth daily.     empagliflozin (JARDIANCE) 10 MG TABS tablet Take 10  mg by mouth daily.     insulin aspart (NOVOLOG) 100 UNIT/ML injection Inject 12 Units into the skin 2 (two) times daily.     insulin glargine (LANTUS) 100 unit/mL SOPN Inject 30 Units into the skin 2 (two) times daily.     isosorbide mononitrate (IMDUR) 30 MG 24 hr tablet Take 60 mg by mouth daily.     methocarbamol (ROBAXIN) 500 MG tablet Take 500 mg by mouth daily as needed for muscle spasms.     metoprolol succinate (TOPROL-XL) 100 MG 24 hr tablet Take 100 mg by mouth daily.     nitroGLYCERIN (NITROSTAT) 0.4 MG SL tablet Place 0.4 mg under the tongue every 5 (five) minutes as needed for chest pain.     ondansetron (ZOFRAN-ODT) 4 MG disintegrating tablet Take 1 tablet (4 mg total) by mouth every 8 (eight) hours as needed for nausea or vomiting. 20 tablet 0   oxyCODONE-acetaminophen (PERCOCET) 10-325 MG tablet Take 1 tablet by mouth daily as needed for pain.     pantoprazole (PROTONIX) 40 MG tablet Take 40 mg by mouth daily.     sacubitril-valsartan (ENTRESTO) 24-26 MG Take 1 tablet by mouth 2 (two) times daily.     sotalol (BETAPACE) 120 MG tablet Take 120 mg by mouth 2 (two) times daily.     spironolactone (ALDACTONE) 25 MG tablet Take 12.5 mg by mouth daily.     tamsulosin (FLOMAX) 0.4 MG CAPS capsule Take 0.4 mg by mouth daily.     torsemide (DEMADEX) 20 MG tablet Take 20 mg by mouth daily as needed (swelling).     No current facility-administered medications for this visit.    PHYSICAL EXAM Vitals:   06/01/23 0938  BP: 122/72  Pulse: 62  Resp: 20  Temp: 98.2 F (36.8 C)  SpO2: 95%  Weight: 232 lb (105.2 kg)  Height: 5\' 9"  (1.753 m)   Middle-age man in no distress Regular rate and rhythm Unlabored breathing No palpable pedal pulses Small left medial great toe ulcer with clean base  PERTINENT LABORATORY AND RADIOLOGIC DATA  Most recent CBC    Latest Ref Rng & Units 06/01/2022    9:13 PM 12/21/2021    8:47 PM 11/01/2021    1:41 AM  CBC  WBC 4.0 - 10.5 K/uL 5.7  6.8  7.6    Hemoglobin 13.0 - 17.0 g/dL 40.9  81.1  91.4   Hematocrit 39.0 - 52.0 % 50.8  49.2  52.1   Platelets 150 - 400 K/uL 108  133  128      Most recent CMP    Latest Ref Rng & Units 06/01/2022    9:13 PM 12/21/2021    8:47 PM 11/01/2021    1:41 AM  CMP  Glucose 70 -  99 mg/dL 045  409  811   BUN 6 - 20 mg/dL 16  20  25    Creatinine 0.61 - 1.24 mg/dL 9.14  7.82  9.56   Sodium 135 - 145 mmol/L 136  137  133   Potassium 3.5 - 5.1 mmol/L 4.0  4.2  4.4   Chloride 98 - 111 mmol/L 106  105  106   CO2 22 - 32 mmol/L 23  25  20    Calcium 8.9 - 10.3 mg/dL 8.4  8.4  8.5   Total Protein 6.5 - 8.1 g/dL  5.7  6.0   Total Bilirubin 0.3 - 1.2 mg/dL  0.7  0.7   Alkaline Phos 38 - 126 U/L  114  120   AST 15 - 41 U/L  21  29   ALT 0 - 44 U/L  25  51     +-------+-----------+-----------+------------+------------+  ABI/TBIToday's ABIToday's TBIPrevious ABIPrevious TBI  +-------+-----------+-----------+------------+------------+  Right 0.75       0.41                                 +-------+-----------+-----------+------------+------------+  Left  0.76       0.42                                 +-------+-----------+-----------+------------+------------+   Rande Brunt. Lenell Antu, MD FACS [facility] Phone Number: [phone] 06/01/2023 10:09 AM   Total time spent on preparing this encounter including chart review, data review, collecting history, examining the patient, coordinating care for this new patient, 60 minutes.  Portions of this report may have been transcribed using voice recognition software.  Every effort has been made to ensure accuracy; however, inadvertent computerized transcription errors may still be present.

## 2023-06-04 ENCOUNTER — Encounter (HOSPITAL_COMMUNITY)
Admission: RE | Disposition: A | Discharge status: Home / Self Care | Payer: Self-pay | Source: Home / Self Care | Attending: Vascular Surgery

## 2023-06-04 ENCOUNTER — Ambulatory Visit (HOSPITAL_COMMUNITY)
Admission: RE | Admit: 2023-06-04 | Discharge: 2023-06-04 | Disposition: A | Discharge status: Home / Self Care | Payer: Medicaid Other | Attending: Vascular Surgery | Admitting: Vascular Surgery

## 2023-06-04 ENCOUNTER — Other Ambulatory Visit: Payer: Self-pay

## 2023-06-04 DIAGNOSIS — I2510 Atherosclerotic heart disease of native coronary artery without angina pectoris: Secondary | ICD-10-CM | POA: Diagnosis not present

## 2023-06-04 DIAGNOSIS — Z794 Long term (current) use of insulin: Secondary | ICD-10-CM | POA: Diagnosis not present

## 2023-06-04 DIAGNOSIS — Z951 Presence of aortocoronary bypass graft: Secondary | ICD-10-CM | POA: Diagnosis not present

## 2023-06-04 DIAGNOSIS — E11621 Type 2 diabetes mellitus with foot ulcer: Secondary | ICD-10-CM | POA: Diagnosis not present

## 2023-06-04 DIAGNOSIS — L97529 Non-pressure chronic ulcer of other part of left foot with unspecified severity: Secondary | ICD-10-CM | POA: Diagnosis not present

## 2023-06-04 DIAGNOSIS — E1151 Type 2 diabetes mellitus with diabetic peripheral angiopathy without gangrene: Secondary | ICD-10-CM | POA: Insufficient documentation

## 2023-06-04 DIAGNOSIS — I70245 Atherosclerosis of native arteries of left leg with ulceration of other part of foot: Secondary | ICD-10-CM | POA: Diagnosis not present

## 2023-06-04 DIAGNOSIS — Z7984 Long term (current) use of oral hypoglycemic drugs: Secondary | ICD-10-CM | POA: Insufficient documentation

## 2023-06-04 HISTORY — PX: ABDOMINAL AORTOGRAM W/LOWER EXTREMITY: CATH118223

## 2023-06-04 LAB — POCT I-STAT, CHEM 8
BUN: 20 mg/dL (ref 6–20)
Calcium, Ion: 1.12 mmol/L — ABNORMAL LOW (ref 1.15–1.40)
Chloride: 105 mmol/L (ref 98–111)
Creatinine, Ser: 1.1 mg/dL (ref 0.61–1.24)
Glucose, Bld: 125 mg/dL — ABNORMAL HIGH (ref 70–99)
HCT: 49 % (ref 39.0–52.0)
Hemoglobin: 16.7 g/dL (ref 13.0–17.0)
Potassium: 4 mmol/L (ref 3.5–5.1)
Sodium: 141 mmol/L (ref 135–145)
TCO2: 24 mmol/L (ref 22–32)

## 2023-06-04 LAB — GLUCOSE, CAPILLARY
Glucose-Capillary: 102 mg/dL — ABNORMAL HIGH (ref 70–99)
Glucose-Capillary: 137 mg/dL — ABNORMAL HIGH (ref 70–99)

## 2023-06-04 SURGERY — ABDOMINAL AORTOGRAM W/LOWER EXTREMITY
Anesthesia: LOCAL

## 2023-06-04 MED ORDER — SODIUM CHLORIDE 0.9 % IV SOLN: INTRAVENOUS | Status: DC

## 2023-06-04 MED ORDER — SODIUM CHLORIDE 0.9 % IV SOLN: 250.0000 mL | INTRAVENOUS | Status: DC | PRN

## 2023-06-04 MED ORDER — ONDANSETRON HCL 4 MG/2ML IJ SOLN
INTRAMUSCULAR | Status: AC
  Filled 2023-06-04: qty 2

## 2023-06-04 MED ORDER — SODIUM CHLORIDE 0.9% FLUSH: 3.0000 mL | Freq: Two times a day (BID) | INTRAVENOUS | Status: DC

## 2023-06-04 MED ORDER — SODIUM CHLORIDE 0.9% FLUSH: 3.0000 mL | INTRAVENOUS | Status: DC | PRN

## 2023-06-04 MED ORDER — LABETALOL HCL 5 MG/ML IV SOLN: 10.0000 mg | INTRAVENOUS | Status: DC | PRN

## 2023-06-04 MED ORDER — ACETAMINOPHEN 325 MG PO TABS
650.0000 mg | ORAL_TABLET | ORAL | Status: DC | PRN
Start: 2023-06-04 — End: 2023-06-04

## 2023-06-04 MED ORDER — MIDAZOLAM HCL 2 MG/2ML IJ SOLN
INTRAMUSCULAR | Status: AC
  Filled 2023-06-04: qty 2

## 2023-06-04 MED ORDER — HEPARIN (PORCINE) IN NACL 1000-0.9 UT/500ML-% IV SOLN
INTRAVENOUS | Status: DC | PRN
  Administered 2023-06-04 (×2): 500 mL

## 2023-06-04 MED ORDER — FENTANYL CITRATE (PF) 100 MCG/2ML IJ SOLN
INTRAMUSCULAR | Status: AC
  Filled 2023-06-04: qty 2

## 2023-06-04 MED ORDER — MIDAZOLAM HCL 2 MG/2ML IJ SOLN
INTRAMUSCULAR | Status: DC | PRN
  Administered 2023-06-04: 1 mg via INTRAVENOUS

## 2023-06-04 MED ORDER — FENTANYL CITRATE (PF) 100 MCG/2ML IJ SOLN
INTRAMUSCULAR | Status: DC | PRN
  Administered 2023-06-04: 50 ug via INTRAVENOUS

## 2023-06-04 MED ORDER — SODIUM CHLORIDE 0.9 % WEIGHT BASED INFUSION
1.0000 mL/kg/h | INTRAVENOUS | Status: DC
Start: 2023-06-04 — End: 2023-06-04

## 2023-06-04 MED ORDER — LIDOCAINE HCL (PF) 1 % IJ SOLN
INTRAMUSCULAR | Status: DC | PRN
  Administered 2023-06-04: 20 mL

## 2023-06-04 MED ORDER — IODIXANOL 320 MG/ML IV SOLN
INTRAVENOUS | Status: DC | PRN
  Administered 2023-06-04: 35 mL

## 2023-06-04 MED ORDER — ONDANSETRON HCL 4 MG/2ML IJ SOLN: 4.0000 mg | Freq: Four times a day (QID) | INTRAMUSCULAR | Status: DC | PRN

## 2023-06-04 MED ORDER — HYDRALAZINE HCL 20 MG/ML IJ SOLN: 5.0000 mg | INTRAMUSCULAR | Status: DC | PRN

## 2023-06-04 MED ORDER — ONDANSETRON HCL 4 MG/2ML IJ SOLN
INTRAMUSCULAR | Status: DC | PRN
  Administered 2023-06-04: 4 mg via INTRAVENOUS

## 2023-06-04 SURGICAL SUPPLY — 8 items
CATH NAVICROSS ANGLED 135CM (MICROCATHETER) IMPLANT
CATH OMNI FLUSH 5F 65CM (CATHETERS) IMPLANT
KIT MICROPUNCTURE NIT STIFF (SHEATH) IMPLANT
SET ATX-X65L (MISCELLANEOUS) IMPLANT
SHEATH PINNACLE 5F 10CM (SHEATH) IMPLANT
SHEATH PROBE COVER 6X72 (BAG) IMPLANT
TRAY PV CATH (CUSTOM PROCEDURE TRAY) ×1 IMPLANT
WIRE STARTER BENTSON 035X150 (WIRE) IMPLANT

## 2023-06-04 NOTE — Op Note (Signed)
DATE OF SERVICE: 06/04/2023  PATIENT:  Michael Barnett  52 y.o. male  PRE-OPERATIVE DIAGNOSIS:  Atherosclerosis of native arteries of left lower extremity causing ulceration  POST-OPERATIVE DIAGNOSIS:  Same  PROCEDURE:   1) Ultrasound guided right common femoral artery access 2) Aortogram 3) left lower extremity angiogram with second order cannulation  4) Conscious sedation (18 minutes)   SURGEON:  Rande Brunt. Lenell Antu, MD  ASSISTANT: none  ANESTHESIA:   local and IV sedation  ESTIMATED BLOOD LOSS: minimal  LOCAL MEDICATIONS USED:  LIDOCAINE   COUNTS: confirmed correct.  PATIENT DISPOSITION:  PACU - hemodynamically stable.   Delay start of Pharmacological VTE agent (>24hrs) due to surgical blood loss or risk of bleeding: no  INDICATION FOR PROCEDURE: Michael Barnett is a 52 y.o. male with left great toe ischemic ulceration. After careful discussion of risks, benefits, and alternatives the patient was offered angiography. The patient understood and wished to proceed.  OPERATIVE FINDINGS:   Left renal artery: patent Right renal artery: patent  Infrarenal aorta: patent; steep bifurcation  Left common iliac artery: patent Right common iliac artery: patent  Left internal iliac artery: patent Right internal iliac artery: patent  Left external iliac artery: patent Right external iliac artery: patent  Left common femoral artery: patent Right common femoral artery: not studied  Left profunda femoris artery: patent Right profunda femoris artery: not studied  Left superficial femoral artery: "flush" occlusion of artery at its origin Right superficial femoral artery: not studied  Left popliteal artery: reconstitution of above knee popliteal artery; behind and below knee artery patent Right popliteal artery: not studied  Left anterior tibial artery: patent Right anterior tibial artery: not studied  Left tibioperoneal trunk: patent Right tibioperoneal trunk: not studied  Left peroneal  artery: patent Right peroneal artery: not studied  Left posterior tibial artery: patent Right posterior tibial artery: not studied  Left pedal circulation: not well studied Right pedal circulation: not studied   GLASS score. FP 3. IP 0. Stage II.  WIfI score. 2 / 1 / 0. Stage III.   DESCRIPTION OF PROCEDURE: After identification of the patient in the pre-operative holding area, the patient was transferred to the operating room. The patient was positioned supine on the operating room table. Anesthesia was induced. The groins was prepped and draped in standard fashion. A surgical pause was performed confirming correct patient, procedure, and operative location.  The right groin was anesthetized with subcutaneous injection of 1% lidocaine. Using ultrasound guidance, the right common femoral artery was accessed with micropuncture technique. Fluoroscopy was used to confirm cannulation over the femoral head. A sheath injection was performed. The 89F micropuncture sheath was upsized to 14F.   A Benson wire was advanced into the distal aorta. Over the wire an omni flush catheter was advanced to the level of L2. Aortogram was performed - see above for details.   The left common iliac artery was selected with an omniflush catheter and benson guidewire. The wire was advanced into the common femoral artery. Over the wire the omni flush catheter was advanced into the external iliac artery. Selective angiography was performed - see above for details.   The sheath was left in place to be removed in the recovery area.  Conscious sedation was administered with the use of IV fentanyl and midazolam under continuous physician and nurse monitoring.  Heart rate, blood pressure, and oxygen saturation were continuously monitored.  Total sedation time was 18 minutes  Upon completion of the case instrument and sharps counts  were confirmed correct. The patient was transferred to the PACU in good condition. I was present for  all portions of the procedure.  PLAN: Plan L fem-pop as schedule allows. Needs cardiac evaluation prior to OR.   Rande Brunt. Lenell Antu, MD [facility] Phone Number: [phone] 06/04/2023 10:03 AM

## 2023-06-04 NOTE — Progress Notes (Signed)
Site area: rt groin Site Prior to Removal:  Level 0 Pressure Applied For: 25 minutes Manual:   yes Patient Status During Pull:  stable Post Pull Site:  Level 0 Post Pull Instructions Given:  yes Post Pull Pulses Present:  rt dp 1+ palpable Dressing Applied:  gauze and tegaderm Bedrest begins @ 1010 Comments:

## 2023-06-04 NOTE — Interval H&P Note (Signed)
History and Physical Interval Note:  06/04/2023 8:57 AM  Michael Barnett  has presented today for surgery, with the diagnosis of Atherosclerosis of native arteries of left leg with ulceration of other part of foot.  The various methods of treatment have been discussed with the patient and family. After consideration of risks, benefits and other options for treatment, the patient has consented to  Procedure(s): ABDOMINAL AORTOGRAM W/LOWER EXTREMITY (N/A) as a surgical intervention.  The patient's history has been reviewed, patient examined, no change in status, stable for surgery.  I have reviewed the patient's chart and labs.  Questions were answered to the patient's satisfaction.     Leonie Douglas

## 2023-06-07 ENCOUNTER — Encounter (HOSPITAL_COMMUNITY): Payer: Self-pay | Admitting: Vascular Surgery

## 2023-06-07 ENCOUNTER — Ambulatory Visit: Payer: Medicaid Other | Admitting: Podiatry

## 2023-06-08 NOTE — Progress Notes (Unsigned)
Cardiology Office Note:   Date:  06/09/2023  ID:  Michael Barnett, DOB 1971-03-16, MRN [id number] PCP:  Michael Barnett  [facility] Providers Cardiologist:  Michael Skeans, MD Referring MD: Michael Douglas, MD  Chief Complaint/Reason for Referral: Preoperative cardiovascular evaluation prior to vascular surgery ASSESSMENT:    1. Preoperative cardiovascular examination   2. Type 2 diabetes mellitus with complication, with long-term current use of insulin (HCC)   3. Hypertension associated with diabetes (HCC)   4. Hyperlipidemia associated with type 2 diabetes mellitus (HCC)   5. Aortic atherosclerosis (HCC)   6. Peripheral arterial disease (HCC)   7. S/P CABG x 2   8. Ischemic cardiomyopathy   9. ICD (implantable cardioverter-defibrillator) in place     PLAN:   In order of problems listed above: Preoperative cardiac evaluation: I will refer the patient for a right heart catheterization, coronary angiography study, and TTE.  Based on his previous angiogram I do not think there will be much by way of revascularization options.  I will make sure that he is optimized from a volume standpoint.  He is at least at moderate risk for perioperative cardiovascular complication around his surgical peripheral revascularization surgery.  I will reach out to Michael Barnett and I very much recommend that this be done with cardiac anesthesia team.   Type 2 diabetes mellitus: Continue aspirin, atorvastatin, valsartan, and Jardiance. Hypertension: Blood pressure is well-controlled today. Hyperlipidemia: Continue atorvastatin.  The patient follows with a cardiologist at [facility]. Aortic atherosclerosis: Continue aspirin and statin. Peripheral arterial disease: Per vascular surgery service with planned left femoral-popliteal bypass in the near future for CLI. Coronary artery status post CABG: Refer for coronary angiography and I do not think he will have much by way of options for revascularization. Ischemic  cardiomyopathy: Will refer for right heart catheterization to evaluate filling pressures. Status post ICD: Defibrillator interrogation today demonstrated occasional NSVT with no ATP or shocks.  Continue sotalol.        Informed Consent   Shared Decision Making/Informed Consent The risks [stroke (1 in 1000), death (1 in 1000), kidney failure [usually temporary] (1 in 500), bleeding (1 in 200), allergic reaction [possibly serious] (1 in 200)], benefits (diagnostic support and management of coronary artery disease) and alternatives of a cardiac catheterization were discussed in detail with Michael Barnett and he is willing to proceed.      Dispo: Patient will follow-up with his regular cardiologist.     Medication Adjustments/Labs and Tests Ordered: Current medicines are reviewed at length with the patient today.  Concerns regarding medicines are outlined above.  The following changes have been made:  no change   Labs/tests ordered: Orders Placed This Encounter  Procedures   CBC   EKG 12-Lead    Medication Changes: No orders of the defined types were placed in this encounter.   Current medicines are reviewed at length with the patient today.  The patient does not have concerns regarding medicines.     History of Present Illness:      FOCUSED PROBLEM LIST:   Aortic atherosclerosis CT abdomen pelvis 2023 Hyperlipidemia Type 2 diabetes mellitus On insulin Hypertension Peripheral arterial disease ([place]) CLI with occluded left SFA requiring femoropopliteal bypass CKD stage II BMI 32 CAD S/p CABG x 2 LIMA to LAD and vein graft to obtuse marginal 2013 PCI RCA 2016 100% mid LAD, 90% pLAD, 100% Lcx, 100% PDA with patent LIMA to LAD and occluded VG to OM Ischemic cardiomyopathy status post BiV-ICD  EF 30 to 35% TTE September 2024 [facility] CVA  12/24: The patient is a 52 year old male with the above listed medical problems referred for preoperative cardiovascular evaluation prior  to vascular surgery.  The patient was seen by Michael Barnett recently due to critical limb ischemia with a left lower extremity ulceration.  He underwent peripheral angiography which demonstrated an occluded left SFA.  Due to this, he is planned to undergo left femoral popliteal peripheral bypass.  He is referred for preoperative cardiac evaluation.  The patient has a pretty extensive cardiovascular history and most recently presented to an outside hospital in September due to hiccups.  His troponins were mildly elevated thought to be due to demand ischemia.  On review of his records his last heart catheterization was done in May 2022 which demonstrated severe obliterative native vessel disease with a patent LIMA to LAD and occluded vein graft to obtuse marginal.  There were no revascularization options available.  Fortunately the patient denies any chest pain or significant shortness of breath.  He is fairly limited in terms of ambulation due to his arthritis and peripheral vascular disease.  We interrogated his ICD today which demonstrated no ATP or shocks with occasional NSVT.         Current Medications: Current Meds  Medication Sig   aspirin EC 81 MG tablet Take 81 mg by mouth daily. Swallow whole.   atorvastatin (LIPITOR) 80 MG tablet Take 80 mg by mouth daily.   doxycycline (VIBRAMYCIN) 100 MG capsule Take 1 capsule (100 mg total) by mouth 2 (two) times daily.   empagliflozin (JARDIANCE) 25 MG TABS tablet Take 25 mg by mouth daily.   insulin glargine (LANTUS) 100 unit/mL SOPN Inject 30 Units into the skin 2 (two) times daily.   nitroGLYCERIN (NITROSTAT) 0.4 MG SL tablet Place 0.4 mg under the tongue every 5 (five) minutes as needed for chest pain.   ondansetron (ZOFRAN-ODT) 4 MG disintegrating tablet Take 1 tablet (4 mg total) by mouth every 8 (eight) hours as needed for nausea or vomiting.   oxyCODONE-acetaminophen (PERCOCET) 10-325 MG tablet Take 1 tablet by mouth every 4 (four) hours as  needed for pain.   sotalol (BETAPACE) 160 MG tablet Take 160 mg by mouth 2 (two) times daily.   torsemide (DEMADEX) 20 MG tablet Take 20 mg by mouth daily as needed (swelling).   valsartan (DIOVAN) 80 MG tablet Take 1 tablet by mouth daily.     Review of Systems:   Please see the history of present illness.    All other systems reviewed and are negative.     EKGs/Labs/Other Test Reviewed:   EKG: Sinus rhythm with nonspecific T wave abnormality  EKG Interpretation Date/Time:  Wednesday June 09 2023 09:13:43 EST Ventricular Rate:  71 PR Interval:  146 QRS Duration:  102 QT Interval:  402 QTC Calculation: 436 R Axis:   18  Text Interpretation: Sinus rhythm with occasional Premature ventricular complexes Nonspecific T wave abnormality When compared with ECG of 04-Jun-2023 07:38, Premature ventricular complexes are now Present Confirmed by Michael Barnett ([id number]) on 06/09/2023 9:17:18 AM         Risk Assessment/Calculations:          Physical Exam:   VS:  BP 116/64   Pulse 74   Ht 5\' 9"  (1.753 m)   Wt 223 lb (101.2 kg)   SpO2 94%   BMI 32.93 kg/m        Wt Readings from Last 3 Encounters:  06/09/23  223 lb (101.2 kg)  06/04/23 223 lb (101.2 kg)  06/01/23 232 lb (105.2 kg)      GENERAL:  No apparent distress, AOx3 HEENT:  No carotid bruits, +2 carotid impulses, no scleral icterus CAR: RRR no murmurs, gallops, rubs, or thrills RES:  Clear to auscultation bilaterally ABD:  Soft, nontender, nondistended, positive bowel sounds x 4 VASC:  +2 radial pulses, +2 carotid pulses NEURO:  CN 2-12 grossly intact; motor and sensory grossly intact PSYCH:  No active depression or anxiety EXT:  Ulcer left first toe  Signed, Orbie Pyo, MD  06/09/2023 10:34 AM    [facility] [address] [place], [place], [place]  [address] Phone: [phone]; Fax: [phone]   Note:  This document was prepared using Dragon voice recognition software and may include  unintentional dictation errors.

## 2023-06-08 NOTE — H&P (View-Only) (Signed)
Cardiology Office Note:   Date:  06/09/2023  ID:  Michael Barnett, DOB [birth year], MRN [id number] PCP:  Aviva Kluver  [facility] Providers Cardiologist:  Alverda Skeans, MD Referring MD: Leonie Douglas, MD  Chief Complaint/Reason for Referral: Preoperative cardiovascular evaluation prior to vascular surgery ASSESSMENT:    1. Preoperative cardiovascular examination   2. Type 2 diabetes mellitus with complication, with long-term current use of insulin (HCC)   3. Hypertension associated with diabetes (HCC)   4. Hyperlipidemia associated with type 2 diabetes mellitus (HCC)   5. Aortic atherosclerosis (HCC)   6. Peripheral arterial disease (HCC)   7. S/P CABG x 2   8. Ischemic cardiomyopathy   9. ICD (implantable cardioverter-defibrillator) in place     PLAN:   In order of problems listed above: Preoperative cardiac evaluation: I will refer the patient for a right heart catheterization, coronary angiography study, and TTE.  Based on his previous angiogram I do not think there will be much by way of revascularization options.  I will make sure that he is optimized from a volume standpoint.  He is at least at moderate risk for perioperative cardiovascular complication around his surgical peripheral revascularization surgery.  I will reach out to Dr. Lenell Antu and I very much recommend that this be done with cardiac anesthesia team.   Type 2 diabetes mellitus: Continue aspirin, atorvastatin, valsartan, and Jardiance. Hypertension: Blood pressure is well-controlled today. Hyperlipidemia: Continue atorvastatin.  The patient follows with a cardiologist at [facility]. Aortic atherosclerosis: Continue aspirin and statin. Peripheral arterial disease: Per vascular surgery service with planned left femoral-popliteal bypass in the near future for CLI. Coronary artery status post CABG: Refer for coronary angiography and I do not think he will have much by way of options for revascularization. Ischemic  cardiomyopathy: Will refer for right heart catheterization to evaluate filling pressures. Status post ICD: Defibrillator interrogation today demonstrated occasional NSVT with no ATP or shocks.  Continue sotalol.        Informed Consent   Shared Decision Making/Informed Consent The risks [stroke (1 in 1000), death (1 in 1000), kidney failure [usually temporary] (1 in 500), bleeding (1 in 200), allergic reaction [possibly serious] (1 in 200)], benefits (diagnostic support and management of coronary artery disease) and alternatives of a cardiac catheterization were discussed in detail with Michael Barnett and he is willing to proceed.      Dispo: Patient will follow-up with his regular cardiologist.     Medication Adjustments/Labs and Tests Ordered: Current medicines are reviewed at length with the patient today.  Concerns regarding medicines are outlined above.  The following changes have been made:  no change   Labs/tests ordered: Orders Placed This Encounter  Procedures   CBC   EKG 12-Lead    Medication Changes: No orders of the defined types were placed in this encounter.   Current medicines are reviewed at length with the patient today.  The patient does not have concerns regarding medicines.     History of Present Illness:      FOCUSED PROBLEM LIST:   Aortic atherosclerosis CT abdomen pelvis 2023 Hyperlipidemia Type 2 diabetes mellitus On insulin Hypertension Peripheral arterial disease ([place]) CLI with occluded left SFA requiring femoropopliteal bypass CKD stage II BMI 32 CAD S/p CABG x 2 LIMA to LAD and vein graft to obtuse marginal 2013 PCI RCA 2016 100% mid LAD, 90% pLAD, 100% Lcx, 100% PDA with patent LIMA to LAD and occluded VG to OM Ischemic cardiomyopathy status post BiV-ICD  EF 30 to 35% TTE September 2024 [facility] CVA  12/24: The patient is a 52 year old male with the above listed medical problems referred for preoperative cardiovascular evaluation prior  to vascular surgery.  The patient was seen by Dr. Lenell Antu recently due to critical limb ischemia with a left lower extremity ulceration.  He underwent peripheral angiography which demonstrated an occluded left SFA.  Due to this, he is planned to undergo left femoral popliteal peripheral bypass.  He is referred for preoperative cardiac evaluation.  The patient has a pretty extensive cardiovascular history and most recently presented to an outside hospital in September due to hiccups.  His troponins were mildly elevated thought to be due to demand ischemia.  On review of his records his last heart catheterization was done in May 2022 which demonstrated severe obliterative native vessel disease with a patent LIMA to LAD and occluded vein graft to obtuse marginal.  There were no revascularization options available.  Fortunately the patient denies any chest pain or significant shortness of breath.  He is fairly limited in terms of ambulation due to his arthritis and peripheral vascular disease.  We interrogated his ICD today which demonstrated no ATP or shocks with occasional NSVT.         Current Medications: Current Meds  Medication Sig   aspirin EC 81 MG tablet Take 81 mg by mouth daily. Swallow whole.   atorvastatin (LIPITOR) 80 MG tablet Take 80 mg by mouth daily.   doxycycline (VIBRAMYCIN) 100 MG capsule Take 1 capsule (100 mg total) by mouth 2 (two) times daily.   empagliflozin (JARDIANCE) 25 MG TABS tablet Take 25 mg by mouth daily.   insulin glargine (LANTUS) 100 unit/mL SOPN Inject 30 Units into the skin 2 (two) times daily.   nitroGLYCERIN (NITROSTAT) 0.4 MG SL tablet Place 0.4 mg under the tongue every 5 (five) minutes as needed for chest pain.   ondansetron (ZOFRAN-ODT) 4 MG disintegrating tablet Take 1 tablet (4 mg total) by mouth every 8 (eight) hours as needed for nausea or vomiting.   oxyCODONE-acetaminophen (PERCOCET) 10-325 MG tablet Take 1 tablet by mouth every 4 (four) hours as  needed for pain.   sotalol (BETAPACE) 160 MG tablet Take 160 mg by mouth 2 (two) times daily.   torsemide (DEMADEX) 20 MG tablet Take 20 mg by mouth daily as needed (swelling).   valsartan (DIOVAN) 80 MG tablet Take 1 tablet by mouth daily.     Review of Systems:   Please see the history of present illness.    All other systems reviewed and are negative.     EKGs/Labs/Other Test Reviewed:   EKG: Sinus rhythm with nonspecific T wave abnormality  EKG Interpretation Date/Time:  Wednesday June 09 2023 09:13:43 EST Ventricular Rate:  71 PR Interval:  146 QRS Duration:  102 QT Interval:  402 QTC Calculation: 436 R Axis:   18  Text Interpretation: Sinus rhythm with occasional Premature ventricular complexes Nonspecific T wave abnormality When compared with ECG of 04-Jun-2023 07:38, Premature ventricular complexes are now Present Confirmed by Alverda Skeans ([id number]) on 06/09/2023 9:17:18 AM         Risk Assessment/Calculations:          Physical Exam:   VS:  BP 116/64   Pulse 74   Ht 5\' 9"  (1.753 m)   Wt 223 lb (101.2 kg)   SpO2 94%   BMI 32.93 kg/m        Wt Readings from Last 3 Encounters:  06/09/23  223 lb (101.2 kg)  06/04/23 223 lb (101.2 kg)  06/01/23 232 lb (105.2 kg)      GENERAL:  No apparent distress, AOx3 HEENT:  No carotid bruits, +2 carotid impulses, no scleral icterus CAR: RRR no murmurs, gallops, rubs, or thrills RES:  Clear to auscultation bilaterally ABD:  Soft, nontender, nondistended, positive bowel sounds x 4 VASC:  +2 radial pulses, +2 carotid pulses NEURO:  CN 2-12 grossly intact; motor and sensory grossly intact PSYCH:  No active depression or anxiety EXT:  Ulcer left first toe  Signed, Orbie Pyo, MD  06/09/2023 10:34 AM    [facility] [address] [place], [place], [place]  [address] Phone: [phone]; Fax: [phone]   Note:  This document was prepared using Dragon voice recognition software and may include  unintentional dictation errors.

## 2023-06-09 ENCOUNTER — Encounter: Payer: Self-pay | Admitting: Internal Medicine

## 2023-06-09 ENCOUNTER — Other Ambulatory Visit: Payer: Self-pay

## 2023-06-09 ENCOUNTER — Ambulatory Visit: Payer: Medicaid Other | Attending: Internal Medicine | Admitting: Internal Medicine

## 2023-06-09 VITALS — BP 116/64 | HR 74 | Ht 69.0 in | Wt 223.0 lb

## 2023-06-09 DIAGNOSIS — Z01810 Encounter for preprocedural cardiovascular examination: Secondary | ICD-10-CM

## 2023-06-09 DIAGNOSIS — E1159 Type 2 diabetes mellitus with other circulatory complications: Secondary | ICD-10-CM | POA: Diagnosis not present

## 2023-06-09 DIAGNOSIS — E1169 Type 2 diabetes mellitus with other specified complication: Secondary | ICD-10-CM

## 2023-06-09 DIAGNOSIS — Z95810 Presence of automatic (implantable) cardiac defibrillator: Secondary | ICD-10-CM

## 2023-06-09 DIAGNOSIS — E118 Type 2 diabetes mellitus with unspecified complications: Secondary | ICD-10-CM | POA: Diagnosis not present

## 2023-06-09 DIAGNOSIS — Z794 Long term (current) use of insulin: Secondary | ICD-10-CM

## 2023-06-09 DIAGNOSIS — Z951 Presence of aortocoronary bypass graft: Secondary | ICD-10-CM

## 2023-06-09 DIAGNOSIS — E785 Hyperlipidemia, unspecified: Secondary | ICD-10-CM

## 2023-06-09 DIAGNOSIS — I152 Hypertension secondary to endocrine disorders: Secondary | ICD-10-CM

## 2023-06-09 DIAGNOSIS — I700 Atherosclerosis of aorta: Secondary | ICD-10-CM

## 2023-06-09 DIAGNOSIS — I255 Ischemic cardiomyopathy: Secondary | ICD-10-CM

## 2023-06-09 DIAGNOSIS — I739 Peripheral vascular disease, unspecified: Secondary | ICD-10-CM

## 2023-06-09 NOTE — Patient Instructions (Addendum)
Medication Instructions:  Your physician recommends that you continue on your current medications as directed. Please refer to the Current Medication list given to you today.  *If you need a refill on your cardiac medications before your next appointment, please call your pharmacy*  Lab Work: TODAY: CBC If you have labs (blood work) drawn today and your tests are completely normal, you will receive your results only by: MyChart Message (if you have MyChart) OR A paper copy in the mail If you have any lab test that is abnormal or we need to change your treatment, we will call you to review the results.  Testing/Procedures: Your physician has requested that you have a cardiac catheterization. Cardiac catheterization is used to diagnose and/or treat various heart conditions. Doctors may recommend this procedure for a number of different reasons. The most common reason is to evaluate chest pain. Chest pain can be a symptom of coronary artery disease (CAD), and cardiac catheterization can show whether plaque is narrowing or blocking your heart's arteries. This procedure is also used to evaluate the valves, as well as measure the blood flow and oxygen levels in different parts of your heart. For further information please visit [email or web address] Please follow instruction sheet, as given.   Your physician has requested that you have an echocardiogram after heart cath. Echocardiography is a painless test that uses sound waves to create images of your heart. It provides your doctor with information about the size and shape of your heart and how well your heart's chambers and valves are working. This procedure takes approximately one hour. There are no restrictions for this procedure. Please do NOT wear cologne, perfume, aftershave, or lotions (deodorant is allowed). Please arrive 15 minutes prior to your appointment time.  Please note: We ask at that you not bring children with you during ultrasound  (echo/ vascular) testing. Due to room size and safety concerns, children are not allowed in the ultrasound rooms during exams. Our front office staff cannot provide observation of children in our lobby area while testing is being conducted. An adult accompanying a patient to their appointment will only be allowed in the ultrasound room at the discretion of the ultrasound technician under special circumstances. We apologize for any inconvenience.  Follow-Up: At [facility], you and your health needs are our priority.  As part of our continuing mission to provide you with exceptional heart care, we have created designated Provider Care Teams.  These Care Teams include your primary Cardiologist (physician) and Advanced Practice Providers (APPs -  Physician Assistants and Nurse Practitioners) who all work together to provide you with the care you need, when you need it.  Your next appointment:   Will be determined following heart cath and echocardiogram  The format for your next appointment:   In Person  Provider:   Alverda Skeans, MD or Robin Searing, NP or Jari Favre, PA-C or Tereso Newcomer, PA-C or Ronie Spies, PA-C  Other Instructions       Cardiac/Peripheral Catheterization   You are scheduled for a Cardiac Catheterization on Tuesday, December 17 with Dr. Alverda Skeans.  1. Please arrive at the [facility] (Main Entrance A) at [facility]: [address] [place], [place] [address] at 5:30 AM (This time is 2 hour(s) before your procedure to ensure your preparation).   Free valet parking service is available. You will check in at ADMITTING. The support person will be asked to wait in the waiting room.  It is OK to have  someone drop you off and come back when you are ready to be discharged.        Special note: Every effort is made to have your procedure done on time. Please understand that emergencies sometimes delay scheduled procedures.  2. Diet: Do not eat solid foods after  midnight.  You may have clear liquids until 5 AM the day of the procedure.  3. Labs: CBC today  4. Medication instructions in preparation for your procedure:   Contrast Allergy: No  HOLD Jardiance 3 days prior to procedure, you will take you last dose on Friday June 11, 2023.  On the morning of your procedure, take Aspirin 81 mg and any morning medicines NOT listed above.  You may use sips of water.  5. Plan to go home the same day, you will only stay overnight if medically necessary. 6. You MUST have a responsible adult to drive you home. 7. An adult MUST be with you the first 24 hours after you arrive home. 8. Bring a current list of your medications, and the last time and date medication taken. 9. Bring ID and current insurance cards. 10.Please wear clothes that are easy to get on and off and wear slip-on shoes.  Thank you for allowing [place] to care for you!   -- [facility]

## 2023-06-10 LAB — CBC
Hematocrit: 49 % (ref 37.5–51.0)
Hemoglobin: 15.7 g/dL (ref 13.0–17.7)
MCH: 31.1 pg (ref 26.6–33.0)
MCHC: 32 g/dL (ref 31.5–35.7)
MCV: 97 fL (ref 79–97)
Platelets: 115 10*3/uL — ABNORMAL LOW (ref 150–450)
RBC: 5.05 x10E6/uL (ref 4.14–5.80)
RDW: 12.2 % (ref 11.6–15.4)
WBC: 6.6 10*3/uL (ref 3.4–10.8)

## 2023-06-14 ENCOUNTER — Telehealth: Payer: Self-pay | Admitting: *Deleted

## 2023-06-14 NOTE — Telephone Encounter (Signed)
Cardiac Catheterization scheduled at [facility] for: Tuesday June 15, 2023 7:30 AM Arrival time [facility] Main Entrance A at: 5:30 AM  Nothing to eat after midnight prior to procedure, clear liquids until 5 AM day of procedure.  Medication instructions: -Hold:  Insulin-AM of procedure 1/2 usual Insulin dose HS prior to procedure  Jardiance-AM of procedure  Torsemide-AM of procedure  -Other usual morning medications can be taken with sips of water including aspirin 81 mg.  Plan to go home the same day, you will only stay overnight if medically necessary.  You must have responsible adult to drive you home.  Someone must be with you the first 24 hours after you arrive home.  Reviewed procedure instructions with patient.

## 2023-06-15 ENCOUNTER — Encounter (HOSPITAL_COMMUNITY)
Admission: RE | Disposition: A | Discharge status: Home / Self Care | Payer: Self-pay | Source: Home / Self Care | Attending: Cardiovascular Disease

## 2023-06-15 ENCOUNTER — Other Ambulatory Visit: Payer: Self-pay

## 2023-06-15 ENCOUNTER — Ambulatory Visit (HOSPITAL_COMMUNITY)
Admission: RE | Admit: 2023-06-15 | Discharge: 2023-06-15 | Disposition: A | Discharge status: Home / Self Care | Payer: Medicaid Other | Attending: Cardiovascular Disease | Admitting: Cardiovascular Disease

## 2023-06-15 ENCOUNTER — Ambulatory Visit (HOSPITAL_BASED_OUTPATIENT_CLINIC_OR_DEPARTMENT_OTHER): Payer: Medicaid Other

## 2023-06-15 DIAGNOSIS — I255 Ischemic cardiomyopathy: Secondary | ICD-10-CM | POA: Diagnosis not present

## 2023-06-15 DIAGNOSIS — Z8673 Personal history of transient ischemic attack (TIA), and cerebral infarction without residual deficits: Secondary | ICD-10-CM | POA: Diagnosis not present

## 2023-06-15 DIAGNOSIS — Z95810 Presence of automatic (implantable) cardiac defibrillator: Secondary | ICD-10-CM | POA: Diagnosis not present

## 2023-06-15 DIAGNOSIS — N182 Chronic kidney disease, stage 2 (mild): Secondary | ICD-10-CM | POA: Diagnosis not present

## 2023-06-15 DIAGNOSIS — Z7982 Long term (current) use of aspirin: Secondary | ICD-10-CM | POA: Diagnosis not present

## 2023-06-15 DIAGNOSIS — I130 Hypertensive heart and chronic kidney disease with heart failure and stage 1 through stage 4 chronic kidney disease, or unspecified chronic kidney disease: Secondary | ICD-10-CM | POA: Diagnosis not present

## 2023-06-15 DIAGNOSIS — I2582 Chronic total occlusion of coronary artery: Secondary | ICD-10-CM | POA: Diagnosis not present

## 2023-06-15 DIAGNOSIS — E1151 Type 2 diabetes mellitus with diabetic peripheral angiopathy without gangrene: Secondary | ICD-10-CM | POA: Diagnosis not present

## 2023-06-15 DIAGNOSIS — I252 Old myocardial infarction: Secondary | ICD-10-CM | POA: Diagnosis not present

## 2023-06-15 DIAGNOSIS — Z955 Presence of coronary angioplasty implant and graft: Secondary | ICD-10-CM | POA: Diagnosis not present

## 2023-06-15 DIAGNOSIS — Z951 Presence of aortocoronary bypass graft: Secondary | ICD-10-CM | POA: Insufficient documentation

## 2023-06-15 DIAGNOSIS — I700 Atherosclerosis of aorta: Secondary | ICD-10-CM | POA: Insufficient documentation

## 2023-06-15 DIAGNOSIS — I340 Nonrheumatic mitral (valve) insufficiency: Secondary | ICD-10-CM | POA: Insufficient documentation

## 2023-06-15 DIAGNOSIS — Z01818 Encounter for other preprocedural examination: Secondary | ICD-10-CM

## 2023-06-15 DIAGNOSIS — E785 Hyperlipidemia, unspecified: Secondary | ICD-10-CM | POA: Insufficient documentation

## 2023-06-15 DIAGNOSIS — I5022 Chronic systolic (congestive) heart failure: Secondary | ICD-10-CM | POA: Insufficient documentation

## 2023-06-15 DIAGNOSIS — Z01810 Encounter for preprocedural cardiovascular examination: Secondary | ICD-10-CM | POA: Diagnosis present

## 2023-06-15 DIAGNOSIS — E1122 Type 2 diabetes mellitus with diabetic chronic kidney disease: Secondary | ICD-10-CM | POA: Diagnosis not present

## 2023-06-15 DIAGNOSIS — I2510 Atherosclerotic heart disease of native coronary artery without angina pectoris: Secondary | ICD-10-CM | POA: Diagnosis not present

## 2023-06-15 DIAGNOSIS — Z794 Long term (current) use of insulin: Secondary | ICD-10-CM | POA: Diagnosis not present

## 2023-06-15 DIAGNOSIS — Z79899 Other long term (current) drug therapy: Secondary | ICD-10-CM | POA: Insufficient documentation

## 2023-06-15 HISTORY — PX: RIGHT/LEFT HEART CATH AND CORONARY/GRAFT ANGIOGRAPHY: CATH118267

## 2023-06-15 LAB — POCT I-STAT 7, (LYTES, BLD GAS, ICA,H+H)
Acid-base deficit: 3 mmol/L — ABNORMAL HIGH (ref 0.0–2.0)
Bicarbonate: 22.8 mmol/L (ref 20.0–28.0)
Calcium, Ion: 1.2 mmol/L (ref 1.15–1.40)
HCT: 47 % (ref 39.0–52.0)
Hemoglobin: 16 g/dL (ref 13.0–17.0)
O2 Saturation: 88 %
Potassium: 4 mmol/L (ref 3.5–5.1)
Sodium: 140 mmol/L (ref 135–145)
TCO2: 24 mmol/L (ref 22–32)
pCO2 arterial: 42.9 mm[Hg] (ref 32–48)
pH, Arterial: 7.334 — ABNORMAL LOW (ref 7.35–7.45)
pO2, Arterial: 59 mm[Hg] — ABNORMAL LOW (ref 83–108)

## 2023-06-15 LAB — BASIC METABOLIC PANEL
Anion gap: 8 (ref 5–15)
BUN: 22 mg/dL — ABNORMAL HIGH (ref 6–20)
CO2: 27 mmol/L (ref 22–32)
Calcium: 8.9 mg/dL (ref 8.9–10.3)
Chloride: 104 mmol/L (ref 98–111)
Creatinine, Ser: 1.09 mg/dL (ref 0.61–1.24)
GFR, Estimated: >60 mL/min (ref >60)
Glucose, Bld: 202 mg/dL — ABNORMAL HIGH (ref 70–99)
Potassium: 4.3 mmol/L (ref 3.5–5.1)
Sodium: 139 mmol/L (ref 135–145)

## 2023-06-15 LAB — GLUCOSE, CAPILLARY
Glucose-Capillary: 198 mg/dL — ABNORMAL HIGH (ref 70–99)
Glucose-Capillary: 137 mg/dL — ABNORMAL HIGH (ref 70–99)

## 2023-06-15 LAB — ECHOCARDIOGRAM COMPLETE
Area-P 1/2: 5.37 cm2
Calc EF: 58.3 %
Height: 69 in
MV M vel: 5.04 m/s
MV Peak grad: 101.6 mm[Hg]
S' Lateral: 4.6 cm
Single Plane A2C EF: 65.1 %
Single Plane A4C EF: 46.3 %
Weight: 3568 [oz_av]

## 2023-06-15 LAB — POCT I-STAT EG7
Acid-base deficit: 2 mmol/L (ref 0.0–2.0)
Bicarbonate: 23.8 mmol/L (ref 20.0–28.0)
Calcium, Ion: 1.2 mmol/L (ref 1.15–1.40)
HCT: 46 % (ref 39.0–52.0)
Hemoglobin: 15.6 g/dL (ref 13.0–17.0)
O2 Saturation: 65 %
Potassium: 4 mmol/L (ref 3.5–5.1)
Sodium: 140 mmol/L (ref 135–145)
TCO2: 25 mmol/L (ref 22–32)
pCO2, Ven: 45.1 mm[Hg] (ref 44–60)
pH, Ven: 7.331 (ref 7.25–7.43)
pO2, Ven: 36 mm[Hg] (ref 32–45)

## 2023-06-15 SURGERY — RIGHT/LEFT HEART CATH AND CORONARY/GRAFT ANGIOGRAPHY
Anesthesia: LOCAL

## 2023-06-15 MED ORDER — FENTANYL CITRATE (PF) 100 MCG/2ML IJ SOLN
INTRAMUSCULAR | Status: DC | PRN
  Administered 2023-06-15 (×2): 25 ug via INTRAVENOUS

## 2023-06-15 MED ORDER — SODIUM CHLORIDE 0.9 % IV SOLN: 250.0000 mL | INTRAVENOUS | Status: DC | PRN

## 2023-06-15 MED ORDER — IOHEXOL 350 MG/ML SOLN
INTRAVENOUS | Status: DC | PRN
  Administered 2023-06-15: 50 mL via INTRA_ARTERIAL

## 2023-06-15 MED ORDER — IOPAMIDOL (ISOVUE-370) INJECTION 76%: INTRAVENOUS | Status: DC | PRN

## 2023-06-15 MED ORDER — HEPARIN (PORCINE) IN NACL 1000-0.9 UT/500ML-% IV SOLN
INTRAVENOUS | Status: DC | PRN
  Administered 2023-06-15 (×2): 500 mL

## 2023-06-15 MED ORDER — HYDRALAZINE HCL 20 MG/ML IJ SOLN: 10.0000 mg | INTRAMUSCULAR | Status: AC | PRN

## 2023-06-15 MED ORDER — LIDOCAINE HCL (PF) 1 % IJ SOLN
INTRAMUSCULAR | Status: DC | PRN
  Administered 2023-06-15: 20 mL
  Administered 2023-06-15: 5 mL
  Administered 2023-06-15: 2 mL

## 2023-06-15 MED ORDER — SODIUM CHLORIDE 0.9 % WEIGHT BASED INFUSION
1.0000 mL/kg/h | INTRAVENOUS | Status: DC
Start: 2023-06-15 — End: 2023-06-16

## 2023-06-15 MED ORDER — ASPIRIN 81 MG PO CHEW: 81.0000 mg | CHEWABLE_TABLET | ORAL | Status: DC

## 2023-06-15 MED ORDER — ACETAMINOPHEN 325 MG PO TABS: 650.0000 mg | ORAL_TABLET | ORAL | Status: DC | PRN

## 2023-06-15 MED ORDER — PERFLUTREN LIPID MICROSPHERE
1.0000 mL | INTRAVENOUS | Status: AC | PRN
  Administered 2023-06-15: 3 mL via INTRAVENOUS

## 2023-06-15 MED ORDER — FENTANYL CITRATE (PF) 100 MCG/2ML IJ SOLN
INTRAMUSCULAR | Status: AC
  Filled 2023-06-15: qty 2

## 2023-06-15 MED ORDER — LIDOCAINE HCL (PF) 1 % IJ SOLN
INTRAMUSCULAR | Status: AC
  Filled 2023-06-15: qty 30

## 2023-06-15 MED ORDER — HEPARIN SODIUM (PORCINE) 1000 UNIT/ML IJ SOLN
INTRAMUSCULAR | Status: AC
  Filled 2023-06-15: qty 10

## 2023-06-15 MED ORDER — VERAPAMIL HCL 2.5 MG/ML IV SOLN
INTRAVENOUS | Status: AC
  Filled 2023-06-15: qty 2

## 2023-06-15 MED ORDER — MIDAZOLAM HCL 2 MG/2ML IJ SOLN
INTRAMUSCULAR | Status: DC | PRN
  Administered 2023-06-15 (×2): 1 mg via INTRAVENOUS

## 2023-06-15 MED ORDER — SODIUM CHLORIDE 0.9% FLUSH: 3.0000 mL | INTRAVENOUS | Status: DC | PRN

## 2023-06-15 MED ORDER — MIDAZOLAM HCL 2 MG/2ML IJ SOLN
INTRAMUSCULAR | Status: AC
  Filled 2023-06-15: qty 2

## 2023-06-15 MED ORDER — SODIUM CHLORIDE 0.9 % WEIGHT BASED INFUSION
3.0000 mL/kg/h | INTRAVENOUS | Status: AC
  Administered 2023-06-15: 3 mL/kg/h via INTRAVENOUS

## 2023-06-15 MED ORDER — ONDANSETRON HCL 4 MG/2ML IJ SOLN
4.0000 mg | Freq: Four times a day (QID) | INTRAMUSCULAR | Status: DC | PRN
Start: 2023-06-15 — End: 2023-06-17

## 2023-06-15 MED ORDER — SODIUM CHLORIDE 0.9% FLUSH: 3.0000 mL | Freq: Two times a day (BID) | INTRAVENOUS | Status: DC

## 2023-06-15 MED ORDER — LABETALOL HCL 5 MG/ML IV SOLN: 10.0000 mg | INTRAVENOUS | Status: AC | PRN

## 2023-06-15 SURGICAL SUPPLY — 17 items
CATH INFINITI 5 FR IM (CATHETERS) IMPLANT
CATH INFINITI 5FR MULTPACK ANG (CATHETERS) IMPLANT
CATH SWAN GANZ 7F STRAIGHT (CATHETERS) IMPLANT
CLOSURE MYNX CONTROL 5F (Vascular Products) IMPLANT
GLIDESHEATH SLEND SS 6F .021 (SHEATH) IMPLANT
GUIDEWIRE INQWIRE 1.5J.035X260 (WIRE) IMPLANT
INQWIRE 1.5J .035X260CM (WIRE) ×1
KIT MICROPUNCTURE NIT STIFF (SHEATH) IMPLANT
KIT SYRINGE INJ CVI SPIKEX1 (MISCELLANEOUS) IMPLANT
PACK CARDIAC CATHETERIZATION (CUSTOM PROCEDURE TRAY) ×1 IMPLANT
SET ATX-X65L (MISCELLANEOUS) IMPLANT
SHEATH GLIDE SLENDER 4/5FR (SHEATH) IMPLANT
SHEATH PINNACLE 5F 10CM (SHEATH) IMPLANT
SHEATH PINNACLE 7F 10CM (SHEATH) IMPLANT
SHEATH PROBE COVER 6X72 (BAG) IMPLANT
WIRE EMERALD 3MM-J .035X150CM (WIRE) IMPLANT
WIRE MICROINTRODUCER 60CM (WIRE) IMPLANT

## 2023-06-15 NOTE — Progress Notes (Signed)
Pt ambulated to and from bathroom to void with no signs of oozing from right groin site  

## 2023-06-15 NOTE — Interval H&P Note (Signed)
History and Physical Interval Note:  06/15/2023 8:54 AM  Michael Barnett  has presented today for surgery, with the diagnosis of pre op evaluation.  The various methods of treatment have been discussed with the patient and family. After consideration of risks, benefits and other options for treatment, the patient has consented to  Procedure(s): RIGHT/LEFT HEART CATH AND CORONARY/GRAFT ANGIOGRAPHY (N/A) as a surgical intervention.  The patient's history has been reviewed, patient examined, no change in status, stable for surgery.  I have reviewed the patient's chart and labs.  Questions were answered to the patient's satisfaction.     Tonny Bollman

## 2023-06-15 NOTE — Discharge Instructions (Signed)

## 2023-06-16 ENCOUNTER — Encounter (HOSPITAL_COMMUNITY): Payer: Self-pay | Admitting: Cardiovascular Disease

## 2023-06-16 ENCOUNTER — Other Ambulatory Visit: Payer: Self-pay

## 2023-06-16 DIAGNOSIS — I70245 Atherosclerosis of native arteries of left leg with ulceration of other part of foot: Secondary | ICD-10-CM

## 2023-06-16 MED FILL — Heparin Sodium (Porcine) Inj 1000 Unit/ML: INTRAMUSCULAR | Qty: 10 | Status: AC

## 2023-06-16 MED FILL — Verapamil HCl IV Soln 2.5 MG/ML: INTRAVENOUS | Qty: 2 | Status: AC

## 2023-06-21 ENCOUNTER — Encounter (HOSPITAL_COMMUNITY): Payer: Self-pay | Admitting: Vascular Surgery

## 2023-06-21 ENCOUNTER — Ambulatory Visit (INDEPENDENT_AMBULATORY_CARE_PROVIDER_SITE_OTHER): Payer: Medicaid Other | Admitting: Podiatry

## 2023-06-21 ENCOUNTER — Encounter: Payer: Self-pay | Admitting: Podiatry

## 2023-06-21 DIAGNOSIS — L97521 Non-pressure chronic ulcer of other part of left foot limited to breakdown of skin: Secondary | ICD-10-CM | POA: Diagnosis not present

## 2023-06-21 DIAGNOSIS — E1142 Type 2 diabetes mellitus with diabetic polyneuropathy: Secondary | ICD-10-CM

## 2023-06-21 NOTE — Progress Notes (Signed)
  Subjective:  Patient ID: Michael Barnett, male    DOB: 1971-04-08,   MRN: [id number]  Chief Complaint  Patient presents with   Foot Ulcer    Pt presents for  a follow up of a skin ulcer of left great toe states he is doing well.    52 y.o. male presents for  follow-up of left great toe ulcer. Has undergone angiogram and heart cath. He is scheduled for fem pop bypass graft on 12/26. Relates the toe is doing well.   PCP:  Pcp, No    . Denies any other pedal complaints. Denies n/v/f/c.   Past Medical History:  Diagnosis Date   AICD (automatic cardioverter/defibrillator) present    CHF (congestive heart failure) (HCC)    Coronary artery disease    Defibrillator activation    Diabetes mellitus     Objective:  Physical Exam: Vascular: DP/PT pulses non palpable bilateral. CFT <3 seconds. Absent hair growth on digits. Edema noted to bilateral lower extremities. Xerosis noted bilaterally.  Skin. No lacerations or abrasions bilateral feet. Nails 1-5 bilateral  are thickened discolored and elongated with subungual debris. Hyperkeratotic lesions noted sub fourth metatarsal on left. Dorsal medial left hallux with hyperkeratosis possible remaining opening underlying callus area.  Musculoskeletal: MMT 5/5 bilateral lower extremities in DF, PF, Inversion and Eversion. Deceased ROM in DF of ankle joint.  Neurological: Sensation intact to light touch. Protective sensation diminished bilateral.    Assessment:   1. Skin ulcer of left great toe, limited to breakdown of skin (HCC)   2. Type 2 diabetes mellitus with peripheral neuropathy (HCC)       Plan:  Patient was evaluated and treated and all questions answered. -Discussed and educated patient on diabetic foot care, especially with  regards to the vascular, neurological and musculoskeletal systems.  -Stressed the importance of good glycemic control and the detriment of not  controlling glucose levels in relation to the foot. -Discussed  supportive shoes at all times and checking feet regularly.  Ulcer dorsal left great toe limited to breakdown of skin  -No debridment today awaiting bypass.  -Dressed with betadine, DSD. -No abx indicated.  -Discussed glucose control and proper protein-rich diet.  -Discussed if any worsening redness, pain, fever or chills to call or may need to report to the emergency room. Patient expressed understanding.   Return in 2 weeks for wound/callus check.    Return in about 2 weeks (around 07/05/2023) for wound check.   Louann Sjogren, DPM

## 2023-06-21 NOTE — Progress Notes (Signed)
SDW call  Patient was given pre-op instructions over the phone. Patient verbalized understanding of instructions provided.     PCP - denies Cardiologist - Dr. Alverda Skeans  EP Cardiologist. Dr. Pollyann Kennedy, [facility] Pulmonary:    PPM/ICD - ICD, [facility] Scientific Device Orders - requested from [facility] EP clinic,  [place] [phone], phone: [phone] Rep Notified - Joey   Chest x-ray - 06/01/2022 EKG -  06/09/2023 Stress Test -03/13/2021 ECHO - 06/15/2023 Cardiac Cath - 06/15/2023  Sleep Study/sleep apnea/CPAP: Diagnosed with sleep apnea, wears CPAP occasionally  Type II diabetic.  Ran out of testing strips, last time checked over a week ago Fasting Blood sugar range: 130 How often check sugars:  Jardiance, last dose 06/21/2023 Lantus, instructed to use 15 units the night before and the morning of surgery which is 50% of the normal dose    Blood Thinner Instructions: denies Aspirin Instructions:continue   ERAS Protcol - NPO   COVID TEST- na    Anesthesia review: Yes.  DM, HTN, PAD, ICD, OSA with CPAP, MI, stroke,    Patient denies shortness of breath, fever, cough and chest pain over the phone call  Your procedure is scheduled on Thursday June 24, 2023  Report to [facility] Main Entrance "A" at  1000  A.M., then check in with the Admitting office.  Call this number if you have problems the morning of surgery:  [phone]   If you have any questions prior to your surgery date call [phone]: Open Monday-Friday 8am-4pm If you experience any cold or flu symptoms such as cough, fever, chills, shortness of breath, etc. between now and your scheduled surgery, please notify us at the above number    Remember:  Do not eat or drink after midnight the night before your surgery  Take these medicines the morning of surgery with A SIP OF WATER:  ASA, lipitor, doxycycline, sotalol  As needed:  zofran, percocet  As of today, STOP taking any Aleve, Naproxen,  Ibuprofen, Motrin, Advil, Goody's, BC's, all herbal medications, fish oil, and all vitamins.

## 2023-06-21 NOTE — Progress Notes (Signed)
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   Recent admission to [facility] 02/2023 for chest pain with hiccups, with a mild troponin elevation, which was felt to be demand mediated. He was started on ARB therapy, and diuretics were initially held, although restarted prior to discharge. Additionally, his insulin therapy was adjusted with a A1c noted to be 12.3% during this admission.   Pt seen by cardiologist Dr. Lynnette Caffey 06/09/23 for preop eval. Per note, "Preoperative cardiac evaluation: I will refer the patient for a right heart catheterization, coronary angiography study, and TTE.  Based on his previous angiogram I do not think there will be much by way of revascularization options.  I will make sure that he is optimized from a volume standpoint.  He is at least at moderate risk for perioperative cardiovascular complication around his surgical peripheral revascularization surgery.  I will reach out to Dr. Lenell Antu and I very much recommend that this be done with cardiac anesthesia team."  It was also noted that defibrillator interrogation at that time demonstrated occasional NSVT with no ATP or shocks.  He was continued on sotalol.  Echo 06/15/2023 showed EF 45 to 50% with hypokinesis of the posterior wall and akinesis of the inferior wall, grade 3 diastolic dysfunction, normal RV systolic function, mild mitral regurgitation.  Cath 06/15/2023 showed stable coronary anatomy, full results below.  CBC 06/09/2023 with mild thrombocytopenia platelets 115, otherwise WNL.  I-STAT 06/15/2023 WNL.  Patient will need to have surgery evaluation.  EKG 06/05/2023: Sinus rhythm with occasional Premature ventricular complexes.  Rate 71. Nonspecific T  wave abnormality  TTE 06/15/2023:  1. Left ventricular ejection fraction, by estimation, is 45 to 50%. The  left ventricle has mildly decreased function. The left ventricle  demonstrates regional wall motion abnormalities (see scoring  diagram/findings for description). Left ventricular  diastolic parameters are consistent with Grade III diastolic dysfunction  (restrictive).   2. Right ventricular systolic function is normal. The right ventricular  size is normal.   3. Left atrial size was moderately dilated.   4. Right atrial size was mildly dilated.   5. The mitral valve is normal in structure. Mild mitral valve  regurgitation. No evidence of mitral stenosis.   6. The aortic valve is tricuspid. There is mild calcification of the  aortic valve. Aortic valve regurgitation is not visualized. Aortic valve  sclerosis/calcification is present, without any evidence of aortic  stenosis.   7. The inferior vena cava is normal in size with greater than 50%  respiratory variability, suggesting right atrial pressure of 3 mmHg.   Cath 06/15/2023: 1.  Severe native vessel coronary artery disease with severe distal left main stenosis, total occlusion of the proximal circumflex, severe proximal LAD stenosis, and severe diffuse RCA stenosis 2.  Status post CABG with chronic occlusion of the SVG to OM and wide patency of the LIMA to LAD 3.  The left circumflex branches fill late from left to left collaterals 4.  The LIMA graft is large in caliber, widely patent, and fills a large area of myocardium as it backfills the proximal LAD and its branches, also supplies collaterals to the circumflex 5.  Preserved cardiac output with elevated diastolic filling pressures Mean PA pressure 37 mmHg Mean wedge pressure 19 mmHg  Transpulmonary gradient 18 mmHg with PVR 3 Wood units Cardiac output 5.9 L/min     Patient appears to have stable coronary anatomy based on comparison to previous cath report.       [place], [facility]/Anesthesiology Phone [phone] 06/21/2023 10:03 AM

## 2023-06-21 NOTE — Anesthesia Preprocedure Evaluation (Signed)
Anesthesia Evaluation  Patient identified by MRN, date of birth, ID band Patient awake    Reviewed: Allergy & Precautions, NPO status , Patient's Chart, lab work & pertinent test results, reviewed documented beta blocker date and time   History of Anesthesia Complications Negative for: history of anesthetic complications  Airway Mallampati: III  TM Distance: >3 FB     Dental no notable dental hx.    Pulmonary sleep apnea , Current Smoker   breath sounds clear to auscultation       Cardiovascular hypertension, + CAD, + Past MI, + CABG and +CHF  + Cardiac Defibrillator  Rhythm:Regular Rate:Normal  Severe CAD, low normal LVEF   Neuro/Psych neg Seizures CVA, No Residual Symptoms    GI/Hepatic ,,,(+) neg Cirrhosis        Endo/Other  diabetes, Type 2    Renal/GU Renal disease     Musculoskeletal  (+) Arthritis ,    Abdominal   Peds  Hematology   Anesthesia Other Findings   Reproductive/Obstetrics                              Anesthesia Physical Anesthesia Plan  ASA: 3  Anesthesia Plan: Spinal and MAC   Post-op Pain Management:    Induction: Intravenous  PONV Risk Score and Plan: 1 and Ondansetron and Propofol infusion  Airway Management Planned: Natural Airway and Nasal Cannula  Additional Equipment:   Intra-op Plan:   Post-operative Plan:   Informed Consent: I have reviewed the patients History and Physical, chart, labs and discussed the procedure including the risks, benefits and alternatives for the proposed anesthesia with the patient or authorized representative who has indicated his/her understanding and acceptance.     Dental advisory given  Plan Discussed with:   Anesthesia Plan Comments: (PAT note by Michael Poles, PA-C:  52 yo male with pertinent hx including CAD status post prior CABG 2013 and subsequent PCI-RCA 2016, ischemic cardiomyopathy status post ICD, VT  with prior shocks status post VT ablation 10/2022 on sotalol, prior stroke, uncontrolled IDDM 2, hypertension, hyperlipidemia, OSA on CPAP.   Recent admission to [facility] 02/2023 for chest pain with hiccups, with a mild troponin elevation, which was felt to be demand mediated. He was started on ARB therapy, and diuretics were initially held, although restarted prior to discharge. Additionally, his insulin therapy was adjusted with a A1c noted to be 12.3% during this admission.   Pt seen by cardiologist Dr. Lynnette Barnett 06/09/23 for preop eval. Per note, "Preoperative cardiac evaluation: I will refer the patient for a right heart catheterization, coronary angiography study, and TTE.  Based on his previous angiogram I do not think there will be much by way of revascularization options.  I will make sure that he is optimized from a volume standpoint.  He is at least at moderate risk for perioperative cardiovascular complication around his surgical peripheral revascularization surgery.  I will reach out to Dr. Lenell Barnett and I very much recommend that this be done with cardiac anesthesia team."  It was also noted that defibrillator interrogation at that time demonstrated occasional NSVT with no ATP or shocks.  He was continued on sotalol.  Echo 06/15/2023 showed EF 45 to 50% with hypokinesis of the posterior wall and akinesis of the inferior wall, grade 3 diastolic dysfunction, normal RV systolic function, mild mitral regurgitation.  Cath 06/15/2023 showed stable coronary anatomy, full results below.  CBC 06/09/2023 with mild thrombocytopenia platelets  115, otherwise WNL.  I-STAT 06/15/2023 WNL.  Patient will need to have surgery evaluation.  EKG 06/05/2023: Sinus rhythm with occasional Premature ventricular complexes.  Rate 71. Nonspecific T wave abnormality  TTE 06/15/2023:  1. Left ventricular ejection fraction, by estimation, is 45 to 50%. The  left ventricle has mildly decreased function. The left  ventricle  demonstrates regional wall motion abnormalities (see scoring  diagram/findings for description). Left ventricular  diastolic parameters are consistent with Grade III diastolic dysfunction  (restrictive).   2. Right ventricular systolic function is normal. The right ventricular  size is normal.   3. Left atrial size was moderately dilated.   4. Right atrial size was mildly dilated.   5. The mitral valve is normal in structure. Mild mitral valve  regurgitation. No evidence of mitral stenosis.   6. The aortic valve is tricuspid. There is mild calcification of the  aortic valve. Aortic valve regurgitation is not visualized. Aortic valve  sclerosis/calcification is present, without any evidence of aortic  stenosis.   7. The inferior vena cava is normal in size with greater than 50%  respiratory variability, suggesting right atrial pressure of 3 mmHg.   Cath 06/15/2023: 1.  Severe native vessel coronary artery disease with severe distal left main stenosis, total occlusion of the proximal circumflex, severe proximal LAD stenosis, and severe diffuse RCA stenosis 2.  Status post CABG with chronic occlusion of the SVG to OM and wide patency of the LIMA to LAD 3.  The left circumflex branches fill late from left to left collaterals 4.  The LIMA graft is large in caliber, widely patent, and fills a large area of myocardium as it backfills the proximal LAD and its branches, also supplies collaterals to the circumflex 5.  Preserved cardiac output with elevated diastolic filling pressures  Mean PA pressure 37 mmHg  Mean wedge pressure 19 mmHg  Transpulmonary gradient 18 mmHg with PVR 3 Wood units  Cardiac output 5.9 L/min     Patient appears to have stable coronary anatomy based on comparison to previous cath report.     )         Anesthesia Quick Evaluation

## 2023-06-24 ENCOUNTER — Inpatient Hospital Stay (HOSPITAL_COMMUNITY)
Admission: RE | Admit: 2023-06-24 | Discharge: 2023-06-28 | DRG: 253 | Disposition: A | Discharge status: Home / Self Care | Payer: Medicaid Other | Attending: Vascular Surgery | Admitting: Vascular Surgery

## 2023-06-24 ENCOUNTER — Inpatient Hospital Stay (HOSPITAL_COMMUNITY): Payer: Self-pay | Admitting: Physician Assistant

## 2023-06-24 ENCOUNTER — Other Ambulatory Visit: Payer: Self-pay

## 2023-06-24 ENCOUNTER — Inpatient Hospital Stay (HOSPITAL_COMMUNITY): Payer: Medicaid Other | Admitting: Physician Assistant

## 2023-06-24 ENCOUNTER — Encounter (HOSPITAL_COMMUNITY)
Admission: RE | Disposition: A | Discharge status: Home / Self Care | Payer: Self-pay | Source: Home / Self Care | Attending: Vascular Surgery

## 2023-06-24 DIAGNOSIS — Z7982 Long term (current) use of aspirin: Secondary | ICD-10-CM

## 2023-06-24 DIAGNOSIS — Z8673 Personal history of transient ischemic attack (TIA), and cerebral infarction without residual deficits: Secondary | ICD-10-CM

## 2023-06-24 DIAGNOSIS — L97529 Non-pressure chronic ulcer of other part of left foot with unspecified severity: Secondary | ICD-10-CM | POA: Diagnosis present

## 2023-06-24 DIAGNOSIS — Z7984 Long term (current) use of oral hypoglycemic drugs: Secondary | ICD-10-CM

## 2023-06-24 DIAGNOSIS — E785 Hyperlipidemia, unspecified: Secondary | ICD-10-CM | POA: Diagnosis present

## 2023-06-24 DIAGNOSIS — E11621 Type 2 diabetes mellitus with foot ulcer: Secondary | ICD-10-CM | POA: Diagnosis present

## 2023-06-24 DIAGNOSIS — Z951 Presence of aortocoronary bypass graft: Secondary | ICD-10-CM

## 2023-06-24 DIAGNOSIS — I5032 Chronic diastolic (congestive) heart failure: Secondary | ICD-10-CM | POA: Diagnosis present

## 2023-06-24 DIAGNOSIS — G8929 Other chronic pain: Secondary | ICD-10-CM | POA: Diagnosis present

## 2023-06-24 DIAGNOSIS — I70222 Atherosclerosis of native arteries of extremities with rest pain, left leg: Secondary | ICD-10-CM | POA: Diagnosis not present

## 2023-06-24 DIAGNOSIS — I472 Ventricular tachycardia, unspecified: Secondary | ICD-10-CM | POA: Diagnosis present

## 2023-06-24 DIAGNOSIS — F17210 Nicotine dependence, cigarettes, uncomplicated: Secondary | ICD-10-CM

## 2023-06-24 DIAGNOSIS — I70244 Atherosclerosis of native arteries of left leg with ulceration of heel and midfoot: Secondary | ICD-10-CM

## 2023-06-24 DIAGNOSIS — Z95810 Presence of automatic (implantable) cardiac defibrillator: Secondary | ICD-10-CM | POA: Diagnosis not present

## 2023-06-24 DIAGNOSIS — I252 Old myocardial infarction: Secondary | ICD-10-CM | POA: Diagnosis not present

## 2023-06-24 DIAGNOSIS — I70245 Atherosclerosis of native arteries of left leg with ulceration of other part of foot: Secondary | ICD-10-CM | POA: Diagnosis present

## 2023-06-24 DIAGNOSIS — I110 Hypertensive heart disease with heart failure: Secondary | ICD-10-CM | POA: Diagnosis not present

## 2023-06-24 DIAGNOSIS — D696 Thrombocytopenia, unspecified: Secondary | ICD-10-CM | POA: Diagnosis present

## 2023-06-24 DIAGNOSIS — G4733 Obstructive sleep apnea (adult) (pediatric): Secondary | ICD-10-CM | POA: Diagnosis present

## 2023-06-24 DIAGNOSIS — I2510 Atherosclerotic heart disease of native coronary artery without angina pectoris: Secondary | ICD-10-CM | POA: Diagnosis present

## 2023-06-24 DIAGNOSIS — Z794 Long term (current) use of insulin: Secondary | ICD-10-CM

## 2023-06-24 DIAGNOSIS — Z79899 Other long term (current) drug therapy: Secondary | ICD-10-CM | POA: Diagnosis not present

## 2023-06-24 DIAGNOSIS — I255 Ischemic cardiomyopathy: Secondary | ICD-10-CM | POA: Diagnosis present

## 2023-06-24 DIAGNOSIS — F17200 Nicotine dependence, unspecified, uncomplicated: Secondary | ICD-10-CM | POA: Diagnosis present

## 2023-06-24 DIAGNOSIS — Z9861 Coronary angioplasty status: Secondary | ICD-10-CM

## 2023-06-24 DIAGNOSIS — E1151 Type 2 diabetes mellitus with diabetic peripheral angiopathy without gangrene: Principal | ICD-10-CM | POA: Diagnosis present

## 2023-06-24 DIAGNOSIS — I509 Heart failure, unspecified: Secondary | ICD-10-CM | POA: Diagnosis not present

## 2023-06-24 DIAGNOSIS — Z888 Allergy status to other drugs, medicaments and biological substances status: Secondary | ICD-10-CM

## 2023-06-24 DIAGNOSIS — M1990 Unspecified osteoarthritis, unspecified site: Secondary | ICD-10-CM | POA: Diagnosis present

## 2023-06-24 DIAGNOSIS — E1165 Type 2 diabetes mellitus with hyperglycemia: Secondary | ICD-10-CM | POA: Diagnosis present

## 2023-06-24 DIAGNOSIS — I739 Peripheral vascular disease, unspecified: Principal | ICD-10-CM | POA: Diagnosis present

## 2023-06-24 HISTORY — PX: FEMORAL-POPLITEAL BYPASS GRAFT: SHX937

## 2023-06-24 HISTORY — DX: Presence of automatic (implantable) cardiac defibrillator: Z95.810

## 2023-06-24 HISTORY — DX: Sleep apnea, unspecified: G47.30

## 2023-06-24 HISTORY — DX: Essential (primary) hypertension: I10

## 2023-06-24 HISTORY — DX: Acute myocardial infarction, unspecified: I21.9

## 2023-06-24 HISTORY — DX: Unspecified osteoarthritis, unspecified site: M19.90

## 2023-06-24 HISTORY — DX: Cerebral infarction, unspecified: I63.9

## 2023-06-24 LAB — CBC
HCT: 53.7 % — ABNORMAL HIGH (ref 39.0–52.0)
HCT: 49.4 % (ref 39.0–52.0)
Hemoglobin: 17.5 g/dL — ABNORMAL HIGH (ref 13.0–17.0)
Hemoglobin: 16 g/dL (ref 13.0–17.0)
MCH: 31 pg (ref 26.0–34.0)
MCH: 30.6 pg (ref 26.0–34.0)
MCHC: 32.6 g/dL (ref 30.0–36.0)
MCHC: 32.4 g/dL (ref 30.0–36.0)
MCV: 95.2 fL (ref 80.0–100.0)
MCV: 94.5 fL (ref 80.0–100.0)
Platelets: 147 10*3/uL — ABNORMAL LOW (ref 150–400)
Platelets: 138 10*3/uL — ABNORMAL LOW (ref 150–400)
RBC: 5.64 MIL/uL (ref 4.22–5.81)
RBC: 5.23 MIL/uL (ref 4.22–5.81)
RDW: 13.2 % (ref 11.5–15.5)
RDW: 13.2 % (ref 11.5–15.5)
WBC: 7.2 10*3/uL (ref 4.0–10.5)
WBC: 10.1 10*3/uL (ref 4.0–10.5)
nRBC: 0 % (ref 0.0–0.2)
nRBC: 0 % (ref 0.0–0.2)

## 2023-06-24 LAB — PROTIME-INR
INR: 1.1 (ref 0.8–1.2)
Prothrombin Time: 14.3 s (ref 11.4–15.2)

## 2023-06-24 LAB — COMPREHENSIVE METABOLIC PANEL
ALT: 24 U/L (ref 0–44)
AST: 25 U/L (ref 15–41)
Albumin: 3.7 g/dL (ref 3.5–5.0)
Alkaline Phosphatase: 111 U/L (ref 38–126)
Anion gap: 9 (ref 5–15)
BUN: 23 mg/dL — ABNORMAL HIGH (ref 6–20)
CO2: 21 mmol/L — ABNORMAL LOW (ref 22–32)
Calcium: 8.9 mg/dL (ref 8.9–10.3)
Chloride: 105 mmol/L (ref 98–111)
Creatinine, Ser: 1.12 mg/dL (ref 0.61–1.24)
GFR, Estimated: >60 mL/min (ref >60)
Glucose, Bld: 249 mg/dL — ABNORMAL HIGH (ref 70–99)
Potassium: 4.7 mmol/L (ref 3.5–5.1)
Sodium: 135 mmol/L (ref 135–145)
Total Bilirubin: 1.1 mg/dL (ref <1.2)
Total Protein: 6.5 g/dL (ref 6.5–8.1)

## 2023-06-24 LAB — CREATININE, SERUM
Creatinine, Ser: 1.12 mg/dL (ref 0.61–1.24)
GFR, Estimated: >60 mL/min (ref >60)

## 2023-06-24 LAB — ABO/RH: ABO/RH(D): O POS

## 2023-06-24 LAB — GLUCOSE, CAPILLARY
Glucose-Capillary: 230 mg/dL — ABNORMAL HIGH (ref 70–99)
Glucose-Capillary: 160 mg/dL — ABNORMAL HIGH (ref 70–99)
Glucose-Capillary: 179 mg/dL — ABNORMAL HIGH (ref 70–99)
Glucose-Capillary: 363 mg/dL — ABNORMAL HIGH (ref 70–99)

## 2023-06-24 LAB — TYPE AND SCREEN
ABO/RH(D): O POS
Antibody Screen: NEGATIVE

## 2023-06-24 LAB — APTT: aPTT: 30 s (ref 24–36)

## 2023-06-24 LAB — SURGICAL PCR SCREEN
MRSA, PCR: NEGATIVE
Staphylococcus aureus: NEGATIVE

## 2023-06-24 SURGERY — BYPASS GRAFT FEMORAL-POPLITEAL ARTERY
Anesthesia: Monitor Anesthesia Care | Laterality: Left

## 2023-06-24 MED ORDER — DEXAMETHASONE SODIUM PHOSPHATE 10 MG/ML IJ SOLN
INTRAMUSCULAR | Status: DC | PRN
  Administered 2023-06-24: 10 mg via INTRAVENOUS

## 2023-06-24 MED ORDER — GLYCOPYRROLATE 0.2 MG/ML IJ SOLN
INTRAMUSCULAR | Status: DC | PRN
  Administered 2023-06-24: .1 mg via INTRAVENOUS

## 2023-06-24 MED ORDER — INSULIN ASPART 100 UNIT/ML IJ SOLN
0.0000 [IU] | Freq: Three times a day (TID) | INTRAMUSCULAR | Status: DC
  Administered 2023-06-25: 11 [IU] via SUBCUTANEOUS
  Administered 2023-06-25: 5 [IU] via SUBCUTANEOUS
  Administered 2023-06-25: 11 [IU] via SUBCUTANEOUS
  Administered 2023-06-26: 8 [IU] via SUBCUTANEOUS
  Administered 2023-06-26 (×2): 3 [IU] via SUBCUTANEOUS
  Administered 2023-06-27: 8 [IU] via SUBCUTANEOUS
  Administered 2023-06-27 – 2023-06-28 (×2): 3 [IU] via SUBCUTANEOUS

## 2023-06-24 MED ORDER — ACETAMINOPHEN 325 MG PO TABS
325.0000 mg | ORAL_TABLET | ORAL | Status: DC | PRN
Start: 2023-06-24 — End: 2023-06-28

## 2023-06-24 MED ORDER — PHENOL 1.4 % MT LIQD: 1.0000 | OROMUCOSAL | Status: DC | PRN

## 2023-06-24 MED ORDER — MORPHINE SULFATE (PF) 2 MG/ML IV SOLN
2.0000 mg | INTRAVENOUS | Status: DC | PRN
  Administered 2023-06-24 – 2023-06-26 (×8): 2 mg via INTRAVENOUS
  Filled 2023-06-24 (×8): qty 1

## 2023-06-24 MED ORDER — CEFAZOLIN SODIUM-DEXTROSE 2-4 GM/100ML-% IV SOLN
2.0000 g | Freq: Three times a day (TID) | INTRAVENOUS | Status: AC
  Administered 2023-06-24 – 2023-06-25 (×2): 2 g via INTRAVENOUS
  Filled 2023-06-24 (×2): qty 100

## 2023-06-24 MED ORDER — LIDOCAINE 2% (20 MG/ML) 5 ML SYRINGE
INTRAMUSCULAR | Status: AC
  Filled 2023-06-24: qty 5

## 2023-06-24 MED ORDER — OXYCODONE-ACETAMINOPHEN 5-325 MG PO TABS
1.0000 | ORAL_TABLET | ORAL | Status: DC | PRN
  Administered 2023-06-25 – 2023-06-27 (×5): 1 via ORAL
  Filled 2023-06-24 (×6): qty 1

## 2023-06-24 MED ORDER — 0.9 % SODIUM CHLORIDE (POUR BTL) OPTIME
TOPICAL | Status: DC | PRN
  Administered 2023-06-24: 2000 mL

## 2023-06-24 MED ORDER — PROPOFOL 1000 MG/100ML IV EMUL
INTRAVENOUS | Status: AC
  Filled 2023-06-24: qty 100

## 2023-06-24 MED ORDER — IRBESARTAN 150 MG PO TABS
75.0000 mg | ORAL_TABLET | Freq: Every day | ORAL | Status: DC
Start: 2023-06-24 — End: 2023-06-28
  Administered 2023-06-24 – 2023-06-28 (×5): 75 mg via ORAL
  Filled 2023-06-24 (×5): qty 1

## 2023-06-24 MED ORDER — DEXAMETHASONE SODIUM PHOSPHATE 10 MG/ML IJ SOLN
INTRAMUSCULAR | Status: AC
  Filled 2023-06-24: qty 1

## 2023-06-24 MED ORDER — SOTALOL HCL 80 MG PO TABS
160.0000 mg | ORAL_TABLET | Freq: Two times a day (BID) | ORAL | Status: DC
  Administered 2023-06-24 – 2023-06-28 (×7): 160 mg via ORAL
  Filled 2023-06-24 (×9): qty 2

## 2023-06-24 MED ORDER — CHLORHEXIDINE GLUCONATE CLOTH 2 % EX PADS: 6.0000 | MEDICATED_PAD | Freq: Once | CUTANEOUS | Status: DC

## 2023-06-24 MED ORDER — HYDRALAZINE HCL 20 MG/ML IJ SOLN: 5.0000 mg | INTRAMUSCULAR | Status: DC | PRN

## 2023-06-24 MED ORDER — ASPIRIN 81 MG PO TBEC
81.0000 mg | DELAYED_RELEASE_TABLET | Freq: Every day | ORAL | Status: DC
  Administered 2023-06-24 – 2023-06-28 (×5): 81 mg via ORAL
  Filled 2023-06-24 (×5): qty 1

## 2023-06-24 MED ORDER — OXYCODONE HCL 5 MG PO TABS
5.0000 mg | ORAL_TABLET | ORAL | Status: DC | PRN
  Administered 2023-06-25 – 2023-06-27 (×5): 5 mg via ORAL
  Filled 2023-06-24 (×6): qty 1

## 2023-06-24 MED ORDER — HEPARIN SODIUM (PORCINE) 5000 UNIT/ML IJ SOLN
5000.0000 [IU] | Freq: Three times a day (TID) | INTRAMUSCULAR | Status: DC
  Administered 2023-06-25 – 2023-06-27 (×3): 5000 [IU] via SUBCUTANEOUS
  Filled 2023-06-24 (×6): qty 1

## 2023-06-24 MED ORDER — OXYCODONE-ACETAMINOPHEN 10-325 MG PO TABS: 1.0000 | ORAL_TABLET | ORAL | Status: DC | PRN

## 2023-06-24 MED ORDER — INSULIN ASPART 100 UNIT/ML IJ SOLN
0.0000 [IU] | INTRAMUSCULAR | Status: DC | PRN
Start: 2023-06-24 — End: 2023-06-24
  Filled 2023-06-24: qty 1

## 2023-06-24 MED ORDER — PROTAMINE SULFATE 10 MG/ML IV SOLN
INTRAVENOUS | Status: DC | PRN
Start: 2023-06-24 — End: 2023-06-24
  Administered 2023-06-24 (×5): 10 mg via INTRAVENOUS

## 2023-06-24 MED ORDER — HEPARIN SODIUM (PORCINE) 1000 UNIT/ML IJ SOLN
INTRAMUSCULAR | Status: AC
  Filled 2023-06-24: qty 20

## 2023-06-24 MED ORDER — NITROGLYCERIN 0.4 MG SL SUBL: 0.4000 mg | SUBLINGUAL_TABLET | SUBLINGUAL | Status: DC | PRN

## 2023-06-24 MED ORDER — GUAIFENESIN-DM 100-10 MG/5ML PO SYRP: 15.0000 mL | ORAL_SOLUTION | ORAL | Status: DC | PRN

## 2023-06-24 MED ORDER — HEPARIN 6000 UNIT IRRIGATION SOLUTION
Status: DC | PRN
  Administered 2023-06-24: 1

## 2023-06-24 MED ORDER — MIDAZOLAM HCL 2 MG/2ML IJ SOLN
INTRAMUSCULAR | Status: AC
  Filled 2023-06-24: qty 2

## 2023-06-24 MED ORDER — PROPOFOL 500 MG/50ML IV EMUL
INTRAVENOUS | Status: DC | PRN
  Administered 2023-06-24: 100 ug/kg/min via INTRAVENOUS

## 2023-06-24 MED ORDER — HEMOSTATIC AGENTS (NO CHARGE) OPTIME
TOPICAL | Status: DC | PRN
  Administered 2023-06-24: 1 via TOPICAL

## 2023-06-24 MED ORDER — ONDANSETRON HCL 4 MG/2ML IJ SOLN: 4.0000 mg | Freq: Four times a day (QID) | INTRAMUSCULAR | Status: DC | PRN

## 2023-06-24 MED ORDER — PANTOPRAZOLE SODIUM 40 MG PO TBEC
40.0000 mg | DELAYED_RELEASE_TABLET | Freq: Every day | ORAL | Status: DC
  Administered 2023-06-24 – 2023-06-27 (×4): 40 mg via ORAL
  Filled 2023-06-24 (×4): qty 1

## 2023-06-24 MED ORDER — PHENYLEPHRINE HCL-NACL 20-0.9 MG/250ML-% IV SOLN
INTRAVENOUS | Status: AC
  Filled 2023-06-24: qty 250

## 2023-06-24 MED ORDER — METOPROLOL TARTRATE 5 MG/5ML IV SOLN: 2.0000 mg | INTRAVENOUS | Status: DC | PRN

## 2023-06-24 MED ORDER — MIDAZOLAM HCL 2 MG/2ML IJ SOLN
INTRAMUSCULAR | Status: DC | PRN
  Administered 2023-06-24: 2 mg via INTRAVENOUS

## 2023-06-24 MED ORDER — TORSEMIDE 20 MG PO TABS: 20.0000 mg | ORAL_TABLET | Freq: Every day | ORAL | Status: DC | PRN

## 2023-06-24 MED ORDER — SODIUM CHLORIDE 0.9 % IV SOLN: INTRAVENOUS | Status: DC | PRN

## 2023-06-24 MED ORDER — POLYETHYLENE GLYCOL 3350 17 G PO PACK: 17.0000 g | PACK | Freq: Every day | ORAL | Status: DC | PRN

## 2023-06-24 MED ORDER — ONDANSETRON HCL 4 MG/2ML IJ SOLN
INTRAMUSCULAR | Status: DC | PRN
  Administered 2023-06-24: 4 mg via INTRAVENOUS

## 2023-06-24 MED ORDER — CHLORHEXIDINE GLUCONATE 0.12 % MT SOLN
15.0000 mL | Freq: Once | OROMUCOSAL | Status: AC
  Administered 2023-06-24: 15 mL via OROMUCOSAL
  Filled 2023-06-24: qty 15

## 2023-06-24 MED ORDER — KETAMINE HCL 10 MG/ML IJ SOLN
INTRAMUSCULAR | Status: DC | PRN
  Administered 2023-06-24 (×5): 10 mg via INTRAVENOUS

## 2023-06-24 MED ORDER — ONDANSETRON HCL 4 MG/2ML IJ SOLN
INTRAMUSCULAR | Status: AC
  Filled 2023-06-24: qty 2

## 2023-06-24 MED ORDER — KETAMINE HCL 50 MG/5ML IJ SOSY
PREFILLED_SYRINGE | INTRAMUSCULAR | Status: AC
  Filled 2023-06-24: qty 5

## 2023-06-24 MED ORDER — DOCUSATE SODIUM 100 MG PO CAPS
100.0000 mg | ORAL_CAPSULE | Freq: Every day | ORAL | Status: DC
  Administered 2023-06-25 – 2023-06-28 (×4): 100 mg via ORAL
  Filled 2023-06-24 (×4): qty 1

## 2023-06-24 MED ORDER — ONDANSETRON 4 MG PO TBDP: 4.0000 mg | ORAL_TABLET | Freq: Three times a day (TID) | ORAL | Status: DC | PRN

## 2023-06-24 MED ORDER — BISACODYL 10 MG RE SUPP: 10.0000 mg | Freq: Every day | RECTAL | Status: DC | PRN

## 2023-06-24 MED ORDER — GLYCOPYRROLATE PF 0.2 MG/ML IJ SOSY
PREFILLED_SYRINGE | INTRAMUSCULAR | Status: AC
  Filled 2023-06-24: qty 1

## 2023-06-24 MED ORDER — HEPARIN 6000 UNIT IRRIGATION SOLUTION
Status: AC
  Filled 2023-06-24: qty 500

## 2023-06-24 MED ORDER — LIDOCAINE 2% (20 MG/ML) 5 ML SYRINGE
INTRAMUSCULAR | Status: DC | PRN
  Administered 2023-06-24: 100 mg via INTRAVENOUS

## 2023-06-24 MED ORDER — CEFAZOLIN SODIUM-DEXTROSE 2-4 GM/100ML-% IV SOLN
2.0000 g | INTRAVENOUS | Status: AC
Start: 2023-06-24 — End: 2023-06-24
  Administered 2023-06-24: 2 g via INTRAVENOUS
  Filled 2023-06-24: qty 100

## 2023-06-24 MED ORDER — ATORVASTATIN CALCIUM 80 MG PO TABS
80.0000 mg | ORAL_TABLET | Freq: Every day | ORAL | Status: DC
  Administered 2023-06-24 – 2023-06-27 (×4): 80 mg via ORAL
  Filled 2023-06-24 (×4): qty 1

## 2023-06-24 MED ORDER — SOTALOL HCL 80 MG PO TABS
160.0000 mg | ORAL_TABLET | Freq: Once | ORAL | Status: AC
  Administered 2023-06-24: 160 mg via ORAL
  Filled 2023-06-24: qty 2

## 2023-06-24 MED ORDER — SODIUM CHLORIDE 0.9 % IV SOLN: INTRAVENOUS | Status: DC

## 2023-06-24 MED ORDER — MAGNESIUM SULFATE 2 GM/50ML IV SOLN: 2.0000 g | Freq: Every day | INTRAVENOUS | Status: DC | PRN

## 2023-06-24 MED ORDER — SODIUM CHLORIDE 0.9 % IV SOLN: 500.0000 mL | Freq: Once | INTRAVENOUS | Status: DC | PRN

## 2023-06-24 MED ORDER — LABETALOL HCL 5 MG/ML IV SOLN: 10.0000 mg | INTRAVENOUS | Status: DC | PRN

## 2023-06-24 MED ORDER — SODIUM CHLORIDE 0.9 % IV SOLN: INTRAVENOUS | Status: AC

## 2023-06-24 MED ORDER — HEPARIN SODIUM (PORCINE) 1000 UNIT/ML IJ SOLN
INTRAMUSCULAR | Status: DC | PRN
  Administered 2023-06-24: 10000 [IU] via INTRAVENOUS

## 2023-06-24 MED ORDER — BUPIVACAINE HCL (PF) 0.5 % IJ SOLN
INTRAMUSCULAR | Status: DC | PRN
  Administered 2023-06-24: 2.5 mL

## 2023-06-24 MED ORDER — ACETAMINOPHEN 650 MG RE SUPP: 325.0000 mg | RECTAL | Status: DC | PRN

## 2023-06-24 MED ORDER — ORAL CARE MOUTH RINSE: 15.0000 mL | Freq: Once | OROMUCOSAL | Status: AC

## 2023-06-24 MED ORDER — ALUM & MAG HYDROXIDE-SIMETH 200-200-20 MG/5ML PO SUSP: 15.0000 mL | ORAL | Status: DC | PRN

## 2023-06-24 MED ORDER — POTASSIUM CHLORIDE CRYS ER 20 MEQ PO TBCR: 20.0000 meq | EXTENDED_RELEASE_TABLET | Freq: Every day | ORAL | Status: DC | PRN

## 2023-06-24 MED ORDER — PHENYLEPHRINE HCL-NACL 20-0.9 MG/250ML-% IV SOLN
INTRAVENOUS | Status: DC | PRN
  Administered 2023-06-24: 80 ug via INTRAVENOUS
  Administered 2023-06-24: 20 ug/min via INTRAVENOUS

## 2023-06-24 SURGICAL SUPPLY — 50 items
BAG COUNTER SPONGE SURGICOUNT (BAG) ×1 IMPLANT
BANDAGE ESMARK 6X9 LF (GAUZE/BANDAGES/DRESSINGS) IMPLANT
BENZOIN TINCTURE PRP APPL 2/3 (GAUZE/BANDAGES/DRESSINGS) IMPLANT
BNDG ESMARK 6X9 LF (GAUZE/BANDAGES/DRESSINGS)
CANISTER SUCT 3000ML PPV (MISCELLANEOUS) ×1 IMPLANT
CANNULA VESSEL 3MM 2 BLNT TIP (CANNULA) ×2 IMPLANT
CHLORAPREP W/TINT 26 (MISCELLANEOUS) ×2 IMPLANT
CUFF TOURN SGL QUICK 42 (TOURNIQUET CUFF) IMPLANT
CUFF TRNQT CYL 24X4X16.5-23 (TOURNIQUET CUFF) IMPLANT
CUFF TRNQT CYL 34X4.125X (TOURNIQUET CUFF) IMPLANT
DRAIN CHANNEL 15F RND FF W/TCR (WOUND CARE) IMPLANT
DRAIN PENROSE 12X.25 LTX STRL (MISCELLANEOUS) IMPLANT
DRAPE C-ARM 42X72 X-RAY (DRAPES) IMPLANT
DRAPE HALF SHEET 40X57 (DRAPES) IMPLANT
DRAPE X-RAY CASS 24X20 (DRAPES) IMPLANT
DRSG TEGADERM 4X4.75 (GAUZE/BANDAGES/DRESSINGS) IMPLANT
ELECT REM PT RETURN 9FT ADLT (ELECTROSURGICAL) ×1
ELECTRODE REM PT RTRN 9FT ADLT (ELECTROSURGICAL) ×1 IMPLANT
EVACUATOR SILICONE 100CC (DRAIN) IMPLANT
GAUZE SPONGE 4X4 12PLY STRL (GAUZE/BANDAGES/DRESSINGS) ×1 IMPLANT
GLOVE BIO SURGEON STRL SZ8 (GLOVE) ×1 IMPLANT
GOWN STRL REUS W/ TWL LRG LVL3 (GOWN DISPOSABLE) ×2 IMPLANT
GOWN STRL REUS W/ TWL XL LVL3 (GOWN DISPOSABLE) ×1 IMPLANT
GRAFT PROPATEN W/RING 6X80X60 (Vascular Products) IMPLANT
INSERT FOGARTY SM (MISCELLANEOUS) IMPLANT
KIT BASIN OR (CUSTOM PROCEDURE TRAY) ×1 IMPLANT
KIT TURNOVER KIT B (KITS) ×1 IMPLANT
MARKER GRAFT CORONARY BYPASS (MISCELLANEOUS) IMPLANT
NS IRRIG 1000ML POUR BTL (IV SOLUTION) ×2 IMPLANT
PACK PERIPHERAL VASCULAR (CUSTOM PROCEDURE TRAY) ×1 IMPLANT
PAD ARMBOARD 7.5X6 YLW CONV (MISCELLANEOUS) ×2 IMPLANT
SET COLLECT BLD 21X3/4 12 (NEEDLE) IMPLANT
SET WALTER ACTIVATION W/DRAPE (SET/KITS/TRAYS/PACK) IMPLANT
STOPCOCK 4 WAY LG BORE MALE ST (IV SETS) IMPLANT
STRIP CLOSURE SKIN 1/4X4 (GAUZE/BANDAGES/DRESSINGS) IMPLANT
SUT ETHILON 3 0 PS 1 (SUTURE) IMPLANT
SUT MNCRL AB 4-0 PS2 18 (SUTURE) ×2 IMPLANT
SUT PROLENE 5 0 C 1 24 (SUTURE) ×1 IMPLANT
SUT PROLENE 6 0 BV (SUTURE) ×1 IMPLANT
SUT SILK 2 0 SH (SUTURE) ×1 IMPLANT
SUT SILK 3-0 18XBRD TIE 12 (SUTURE) IMPLANT
SUT VIC AB 2-0 CT1 TAPERPNT 27 (SUTURE) ×2 IMPLANT
SUT VIC AB 3-0 SH 27X BRD (SUTURE) ×2 IMPLANT
TAPE UMBILICAL 1/8X30 (MISCELLANEOUS) IMPLANT
TOWEL GREEN STERILE (TOWEL DISPOSABLE) ×1 IMPLANT
TRAY FOLEY MTR SLVR 16FR STAT (SET/KITS/TRAYS/PACK) ×1 IMPLANT
UNDERPAD 30X36 HEAVY ABSORB (UNDERPADS AND DIAPERS) ×1 IMPLANT
VALVULOTOME HEAD CUTTING LEMTR (VASCULAR PRODUCTS) IMPLANT
VALVULOTOME LEMAITRE (VASCULAR PRODUCTS)
WATER STERILE IRR 1000ML POUR (IV SOLUTION) ×1 IMPLANT

## 2023-06-24 NOTE — Anesthesia Postprocedure Evaluation (Signed)
Anesthesia Post Note  Patient: Michael Barnett  Procedure(s) Performed: BYPASS GRAFT FEMORAL- ABOVE KNEE POPLITEAL ARTERY (Left)     Patient location during evaluation: PACU Anesthesia Type: MAC and Spinal Level of consciousness: oriented and awake and alert Pain management: pain level controlled Vital Signs Assessment: post-procedure vital signs reviewed and stable Respiratory status: spontaneous breathing, respiratory function stable and patient connected to nasal cannula oxygen Cardiovascular status: blood pressure returned to baseline and stable Postop Assessment: no headache, no backache and no apparent nausea or vomiting Anesthetic complications: no   No notable events documented.  Last Vitals:  Vitals:   06/24/23 1545 06/24/23 1600  BP: 111/64 110/66  Pulse: 71 71  Resp: 18 18  Temp:    SpO2: 94% 93%    Last Pain:  Vitals:   06/24/23 1545  TempSrc:   PainSc: 0-No pain                 Mariann Barter

## 2023-06-24 NOTE — Progress Notes (Addendum)
Contacted Environmental manager for representative to be paged.    [phone] Received a call back from [place] with [employer].  He stated it was fine to use a magnet.

## 2023-06-24 NOTE — Discharge Instructions (Signed)

## 2023-06-24 NOTE — Transfer of Care (Signed)
Immediate Anesthesia Transfer of Care Note  Patient: Charlesetta Shanks  Procedure(s) Performed: BYPASS GRAFT FEMORAL- ABOVE KNEE POPLITEAL ARTERY (Left)  Patient Location: PACU  Anesthesia Type:Spinal  Level of Consciousness: drowsy and responds to stimulation  Airway & Oxygen Therapy: Patient Spontanous Breathing and Patient connected to nasal cannula oxygen  Post-op Assessment: Report given to RN and Post -op Vital signs reviewed and stable  Post vital signs: Reviewed and stable  Last Vitals:  Vitals Value Taken Time  BP 118/71 06/24/23 1416  Temp 98.7   Pulse 79 06/24/23 1419  Resp 24 06/24/23 1419  SpO2 93 % 06/24/23 1419  Vitals shown include unfiled device data.  Last Pain:  Vitals:   06/24/23 1059  TempSrc:   PainSc: 0-No pain         Complications: No notable events documented.

## 2023-06-24 NOTE — Op Note (Signed)
DATE OF SERVICE: 06/24/2023   PATIENT:  Michael Barnett  52 y.o. 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 My assistant helped with exposure through counter tension, suctioning, ligation and retraction to better visualize the surgical field.  My assistant expedited sewing during the case by following my sutures. Wherever I use the term "we" in the report, my assistant actively helped me with that portion of the procedure.   ANESTHESIA:   spinal and MAC   EBL: 50mL   BLOOD ADMINISTERED:none   DRAINS: none    LOCAL MEDICATIONS USED:  NONE   SPECIMEN:  none   COUNTS: confirmed correct.   TOURNIQUET:  none   PATIENT DISPOSITION:  PACU - hemodynamically stable.   Delay start of Pharmacological VTE agent (>24hrs) due to surgical blood loss or risk of bleeding: no   INDICATION FOR PROCEDURE: Michael Barnett is a 52 y.o. male with left great toe ulceration and rest pain. After careful discussion of risks, benefits, and alternatives the patient was offered left femoral-popliteal bypass. The patient understood and wished to proceed.   OPERATIVE FINDINGS: common femoral artery diseased, but soft and patent without flow limiting stenosis. Able to pass hemostat several centimeters into lumen of SFA. Healthy above knee popliteal artery. Palpable DP pulse at completion. Doppler flow in foot which was graft dependent at completion.    DESCRIPTION OF PROCEDURE: After identification of the patient in the pre-operative holding area, the patient was transferred to the  operating room. The patient was positioned supine on the operating room table. Anesthesia was induced. The left leg was prepped and draped in standard fashion. A surgical pause was performed confirming correct patient, procedure, and operative location.   An oblique incision was made in the left groin to expose the left common femoral artery bifurcation.  Incision was carried down through the subcutaneous tissue with Bovie electrocautery.  The femoral sheath was identified and incised longitudinally.  The common femoral artery was identified and skeletonized.  The profunda femoris artery and superficial femoral artery were identified and encircled with Silastic Vesseloops.  The proximal common femoral artery was encircled with Silastic Vesseloops.  The wound was packed.  Attention was turned to the above-knee popliteal artery.  A longitudinal incision was made in the medial, distal thigh to expose the popliteal artery.  Incision was carried down through the subcutaneous tissue until the fascia was identified.  An incision was made in the fascia with Bovie electrocautery.  The sartorius muscle was swept anteriorly popliteal space was entered with digital exposure.  Was able to identify the popliteal vascular bundle several centimeters of the popliteal artery were skeletonized.  I encircled the artery proximally and distally with Silastic Vesseloops.  A Kelly-Wick tunneler was used to make a subfascial tunnel connecting the 2 exposures.  The patient was systemically heparinized.  A 6 mm externally supported PTFE vascular graft was brought onto the field and delivered through the tunneler.  Great care was taken to avoid twisting or kinking the graft during delivering through the tunnel.  Activated  clotting time measurements were used throughout the case to confirm adequate anticoagulation.  The Silastic vessel loops in the femoral exposure were secured to the drape to act as clamps.  An anterior arteriotomy was  made on the common femoral artery and lengthened and widened with Potts scissor.  I attempted an eversion endarterectomy of the superficial femoral artery, but easily passed a hemostat several centimeters down the vessel without meeting resistance.  This can likely be engaged endovascularly in the future should this bypass graft thrombosed.  The proximal end of the vascular graft was then beveled to allow end to side anastomosis.  The anastomosis was completed using continuous running suture of 6-0 Prolene.  After completion of the anastomosis, the clamps were released and the anastomosis flushed on the open end of the vascular graft.  The distal end of the vascular graft was beveled to allow end to side anastomosis to the popliteal artery.  Silastic Vesseloops were secured on the popliteal artery.  A medial arteriotomy was made with an 11 blade and extended widened with Potts scissors.  An end to side anastomosis was performed between the vascular graft and the arteriotomy.  This was done using continuous running suture of 6-0 Prolene.  Prior to completion the anastomosis was flushed and de-aired.  Clamps were released on the native popliteal artery and the vascular graft.  Palpable pulse was noted in the popliteal artery after releasing clamps.  Palpable pulse was noted in the dorsalis pedis artery 1 releasing clamps.  Doppler machine was brought in the field to evaluate the revascularization.  Nearly triphasic Doppler flow was heard in the dorsalis pedis artery which was graft dependent.  Satisfied we ended the case here.  Heparin reversed with protamine.  Hemostasis was confirmed in the anastomoses and surgical beds.  The wounds were closed in layers using 2-0 Vicryl, 3-0 Vicryl, 4-0 Monocryl.  Steri-Strips, 4 x 4's, and Tegaderm were applied to the incisions.   Upon completion of the case instrument and sharps counts were confirmed correct. The patient was transferred to the PACU in good condition. I was  present for all portions of the procedure.   FOLLOW UP PLAN: Assuming a normal postoperative course, I will see the patient in 4 weeks with ABI and left lower extremity arterial duplex.    Rande Brunt. Lenell Antu, MD [facility] Phone Number: [phone] 06/24/2023 2:02 PM

## 2023-06-24 NOTE — Anesthesia Procedure Notes (Signed)
Spinal  Patient location during procedure: OR Start time: 06/24/2023 11:42 AM End time: 06/24/2023 11:45 AM Reason for block: surgical anesthesia Staffing Performed: anesthesiologist  Anesthesiologist: Mariann Barter, MD Performed by: Mariann Barter, MD Authorized by: Mariann Barter, MD   Preanesthetic Checklist Completed: patient identified, IV checked, site marked, risks and benefits discussed, surgical consent, monitors and equipment checked, pre-op evaluation and timeout performed Spinal Block Patient position: sitting Prep: DuraPrep Patient monitoring: heart rate, cardiac monitor, continuous pulse ox and blood pressure Approach: midline Location: L3-4 Injection technique: single-shot Needle Needle type: Sprotte  Needle gauge: 24 G Needle length: 9 cm Assessment Sensory level: T4 Events: CSF return

## 2023-06-24 NOTE — Interval H&P Note (Signed)
History and Physical Interval Note:  06/24/2023 10:12 AM  Michael Barnett  has presented today for surgery, with the diagnosis of Atherosclerosis of native arteries of left leg with ulceration of other part of foot.  The various methods of treatment have been discussed with the patient and family. After consideration of risks, benefits and other options for treatment, the patient has consented to  Procedure(s): BYPASS GRAFT FEMORAL-POPLITEAL ARTERY (Left) as a surgical intervention.  The patient's history has been reviewed, patient examined, no change in status, stable for surgery.  I have reviewed the patient's chart and labs.  Questions were answered to the patient's satisfaction.     Leonie Douglas

## 2023-06-24 NOTE — Progress Notes (Signed)
  Day of Surgery Note    Subjective:  resting in recovery   Vitals:   06/24/23 0953  BP: (!) 146/79  Pulse: 73  Resp: 18  Temp: 98.1 F (36.7 C)  SpO2: 99%    Incisions:   left groin and left AK incisions with bandages and clean.  Extremities:  palpable left DP pulse; + doppler flow left PT/pero Cardiac:  regular Lungs:  non labored    Assessment/Plan:  This is a 52 y.o. male who is s/p  Left femoral to AK popliteal bypass with PTFE  -pt with palpable left DP pulse and incisions look fine -pre-op pain meds reordered (PDMP reviewed) and morphine 2mg  IV q2h -DM meds should be restarted tomorrow as tolerated -f/u appt sent to office with ABI and LLE arterial duplex and see Dr. Lenell Antu (MD ONLY).  Office will arrange appt.  May need additional intervention on popliteal stenosis pending duplex.  -to 4 east later this afternoon.   Doreatha Massed, PA-C 06/24/2023 2:27 PM [phone]

## 2023-06-25 ENCOUNTER — Telehealth (HOSPITAL_COMMUNITY): Payer: Self-pay | Admitting: Pharmacy Technician

## 2023-06-25 ENCOUNTER — Other Ambulatory Visit (HOSPITAL_COMMUNITY): Payer: Self-pay

## 2023-06-25 ENCOUNTER — Encounter (HOSPITAL_COMMUNITY): Payer: Self-pay | Admitting: Vascular Surgery

## 2023-06-25 LAB — CBC
HCT: 44.4 % (ref 39.0–52.0)
Hemoglobin: 14.5 g/dL (ref 13.0–17.0)
MCH: 30 pg (ref 26.0–34.0)
MCHC: 32.7 g/dL (ref 30.0–36.0)
MCV: 91.9 fL (ref 80.0–100.0)
Platelets: 131 10*3/uL — ABNORMAL LOW (ref 150–400)
RBC: 4.83 MIL/uL (ref 4.22–5.81)
RDW: 13.2 % (ref 11.5–15.5)
WBC: 11.5 10*3/uL — ABNORMAL HIGH (ref 4.0–10.5)
nRBC: 0 % (ref 0.0–0.2)

## 2023-06-25 LAB — GLUCOSE, CAPILLARY
Glucose-Capillary: 342 mg/dL — ABNORMAL HIGH (ref 70–99)
Glucose-Capillary: 308 mg/dL — ABNORMAL HIGH (ref 70–99)
Glucose-Capillary: 203 mg/dL — ABNORMAL HIGH (ref 70–99)
Glucose-Capillary: 242 mg/dL — ABNORMAL HIGH (ref 70–99)

## 2023-06-25 LAB — BASIC METABOLIC PANEL
Anion gap: 8 (ref 5–15)
BUN: 25 mg/dL — ABNORMAL HIGH (ref 6–20)
CO2: 20 mmol/L — ABNORMAL LOW (ref 22–32)
Calcium: 8.3 mg/dL — ABNORMAL LOW (ref 8.9–10.3)
Chloride: 103 mmol/L (ref 98–111)
Creatinine, Ser: 1.12 mg/dL (ref 0.61–1.24)
GFR, Estimated: >60 mL/min (ref >60)
Glucose, Bld: 388 mg/dL — ABNORMAL HIGH (ref 70–99)
Potassium: 4.5 mmol/L (ref 3.5–5.1)
Sodium: 131 mmol/L — ABNORMAL LOW (ref 135–145)

## 2023-06-25 LAB — LIPID PANEL
Cholesterol: 103 mg/dL (ref 0–200)
HDL: 29 mg/dL — ABNORMAL LOW (ref >40)
LDL Cholesterol: 59 mg/dL (ref 0–99)
Total CHOL/HDL Ratio: 3.6 {ratio}
Triglycerides: 75 mg/dL (ref <150)
VLDL: 15 mg/dL (ref 0–40)

## 2023-06-25 LAB — HEMOGLOBIN A1C
Hgb A1c MFr Bld: 9.1 % — ABNORMAL HIGH (ref 4.8–5.6)
Mean Plasma Glucose: 214 mg/dL

## 2023-06-25 LAB — POCT ACTIVATED CLOTTING TIME
Activated Clotting Time: 291 s
Activated Clotting Time: 279 s

## 2023-06-25 MED ORDER — EMPAGLIFLOZIN 25 MG PO TABS
25.0000 mg | ORAL_TABLET | Freq: Every day | ORAL | Status: DC
  Administered 2023-06-25 – 2023-06-28 (×4): 25 mg via ORAL
  Filled 2023-06-25 (×4): qty 1

## 2023-06-25 NOTE — Evaluation (Signed)
Physical Therapy Evaluation Patient Details Name: Michael Barnett MRN: [id number] DOB: 09-25-1970 Today's Date: 06/25/2023  History of Present Illness  52 y.o. male x/p Left common femoral to above knee popliteal bypass, with atherosclerosis of native arteries of left lower extremity causing ulceration, chronic limb threatening ischemia. PMH CAD, DM, AICD present, CHF  Clinical Impression  Pt is presenting slightly below baseline level of functioning. Pt reports that he was Mod I prior to hospitalization. Currently pt is Mod I to supervision/CGA due to instability and pain in the L groin. Pt improves with RW. Due to pt current functional status, home set up and available assistance at home no recommended skilled physical therapy services at this time on discharge from acute care hospital setting. Will continue to follow in acute setting in order to ensure that pt returns home with decreased risk for falls, injury, re-hospitalization and improved activity tolerance.          If plan is discharge home, recommend the following: Help with stairs or ramp for entrance;Assistance with cooking/housework;Assist for transportation     Equipment Recommendations Rolling walker (2 wheels)     Functional Status Assessment Patient has had a recent decline in their functional status and demonstrates the ability to make significant improvements in function in a reasonable and predictable amount of time.     Precautions / Restrictions Precautions Precautions: Fall;ICD/Pacemaker Restrictions Weight Bearing Restrictions Per Provider Order: No      Mobility  Bed Mobility Overal bed mobility: Modified Independent             General bed mobility comments: increased time due to pain    Transfers Overall transfer level: Needs assistance Equipment used: Rolling walker (2 wheels) Transfers: Sit to/from Stand Sit to Stand: Supervision           General transfer comment: supervision for safety,  pain in L groin from surgical site limiting activity tolerance and gait. Pt reports sometimes R hip and R knee gives out on him    Ambulation/Gait Ambulation/Gait assistance: Supervision Gait Distance (Feet): 400 Feet Assistive device: Rolling walker (2 wheels) Gait Pattern/deviations: Step-through pattern, Decreased stride length, Antalgic Gait velocity: decreased Gait velocity interpretation: 1.31 - 2.62 ft/sec, indicative of limited community ambulator   General Gait Details: Pt has antalgic gait pattern with exaggerated L LE movements due to pain. Initially short step on L that improved with mobility.  Stairs Stairs:  (pt states he can go across the grass and avoid steps if needed. Discussed up with the good down with the bad if pt does navigate stairs.)             Balance Overall balance assessment: Needs assistance Sitting-balance support: No upper extremity supported, Feet supported Sitting balance-Leahy Scale: Good     Standing balance support: No upper extremity supported Standing balance-Leahy Scale: Fair Standing balance comment: limited due to pain at surgical site, L groin. Pt reports       Pertinent Vitals/Pain Pain Assessment Pain Score: 10-Worst pain ever Pain Location: L groin/thigh Pain Descriptors / Indicators: Aching, Throbbing Pain Intervention(s): Monitored during session, Patient requesting pain meds-RN notified    Home Living Family/patient expects to be discharged to:: Private residence Living Arrangements: Alone Available Help at Discharge: Family;Friend(s);Available PRN/intermittently Type of Home: Apartment Home Access: Level entry       Home Layout: One level Home Equipment: None Additional Comments: Pt lives alone, family/friends can assist sometimes if needed    Prior Function Prior Level of Function :  Independent/Modified Independent             Mobility Comments: pt reports fall due to R knee and hip "giving out"        Extremity/Trunk Assessment   Upper Extremity Assessment Upper Extremity Assessment: Overall WFL for tasks assessed    Lower Extremity Assessment Lower Extremity Assessment: Overall WFL for tasks assessed;LLE deficits/detail LLE Deficits / Details: pain from surgical site    Cervical / Trunk Assessment Cervical / Trunk Assessment: Normal  Communication   Communication Communication: No apparent difficulties  Cognition Arousal: Alert Behavior During Therapy: WFL for tasks assessed/performed Overall Cognitive Status: Within Functional Limits for tasks assessed        General Comments General comments (skin integrity, edema, etc.): Pt family present and supportive throughout session. Pt initially stated he did not want a RW but was educated on importance of avoiding falls and agreed.        Assessment/Plan    PT Assessment Patient needs continued PT services  PT Problem List Decreased balance;Decreased activity tolerance;Pain       PT Treatment Interventions DME instruction;Therapeutic activities;Gait training;Therapeutic exercise;Stair training;Balance training;Functional mobility training;Patient/family education    PT Goals (Current goals can be found in the Care Plan section)  Acute Rehab PT Goals Patient Stated Goal: to return home and not use RW PT Goal Formulation: With patient Time For Goal Achievement: 07/09/23 Potential to Achieve Goals: Good    Frequency Min 1X/week        AM-PAC PT "6 Clicks" Mobility  Outcome Measure Help needed turning from your back to your side while in a flat bed without using bedrails?: None Help needed moving from lying on your back to sitting on the side of a flat bed without using bedrails?: None Help needed moving to and from a bed to a chair (including a wheelchair)?: A Little Help needed standing up from a chair using your arms (e.g., wheelchair or bedside chair)?: A Little Help needed to walk in hospital room?: A  Little Help needed climbing 3-5 steps with a railing? : A Little 6 Click Score: 20    End of Session Equipment Utilized During Treatment: Gait belt Activity Tolerance: Patient tolerated treatment well Patient left: in bed;with call bell/phone within reach;with family/visitor present Nurse Communication: Mobility status PT Visit Diagnosis: Unsteadiness on feet (R26.81);Pain;History of falling (Z91.81) Pain - Right/Left: Left Pain - part of body:  (groin)    Time: 1610-9604 PT Time Calculation (min) (ACUTE ONLY): 25 min   Charges:   PT Evaluation $PT Eval Low Complexity: 1 Low PT Treatments $Therapeutic Activity: 8-22 mins PT General Charges $$ ACUTE PT VISIT: 1 Visit         Harrel Carina, DPT, CLT  Acute Rehabilitation Services Office: [phone] (Secure chat preferred)   Claudia Desanctis 06/25/2023, 3:47 PM

## 2023-06-25 NOTE — Progress Notes (Signed)
Pt ambulating hall with family, using RW

## 2023-06-25 NOTE — Progress Notes (Addendum)
Transition of Care [facility]) - Inpatient Brief Assessment   Patient Details  Name: Michael Barnett MRN: [id number] Date of Birth: 1971/02/04  Transition of Care [facility]) CM/SW Contact:    Darrold Span, RN Phone Number: 06/25/2023, 2:49 PM   Clinical Narrative: Noted referral for PCP needs- f/u appointment made w/ [facility] for Jul 12, 2023. TOC to follow for any further Needs pending PT/OT evals. Notified by [facility] liaison -following patient with [facility] office protocol referral for [facility] needs- liaison voiced that they will not plan to service due to age-but to contact liaison to discuss should pt need HH.      Transition of Care Asessment: Insurance and Status: Insurance coverage has been reviewed No PCP- appointment made  Home environment has been reviewed: home Prior level of function:: self Prior/Current Home Services: No current home services Social Drivers of Health Review: SDOH reviewed no interventions necessary Readmission risk has been reviewed: Yes Transition of care needs: transition of care needs identified, TOC will continue to follow

## 2023-06-25 NOTE — Progress Notes (Signed)
Pt escorted by family to visit aunt on [address].  CCMD aware.

## 2023-06-25 NOTE — Telephone Encounter (Signed)
Patient Product/process development scientist completed.    The patient is insured through [employer].     Ran test claim for ezetimibe (Zetia) 10 mg and the current 30 day co-pay is $4.00.   This test claim was processed through [facility]- copay amounts may vary at other pharmacies due to pharmacy/plan contracts, or as the patient moves through the different stages of their insurance plan.     Roland Earl, CPHT Pharmacy Technician III Certified Patient Advocate [facility] Pharm[facility] Patient Advocate Team Direct Number: [phone]  Fax: [phone]

## 2023-06-25 NOTE — Inpatient Diabetes Management (Addendum)
Inpatient Diabetes Program Recommendations  AACE/ADA: New Consensus Statement on Inpatient Glycemic Control (2015)  Target Ranges:  Prepandial:   less than 140 mg/dL      Peak postprandial:   less than 180 mg/dL (1-2 hours)      Critically ill patients:  140 - 180 mg/dL   Lab Results  Component Value Date   GLUCAP 342 (H) 06/25/2023    Review of Glycemic Control  Latest Reference Range & Units 06/24/23 22:39 06/25/23 06:13 06/25/23 11:49  Glucose-Capillary 70 - 99 mg/dL 244 (H) 010 (H) 272 (H)  (H): Data is abnormally high (H): Data is abnormally high  Diabetes history: DM2 Outpatient Diabetes medications: Lantus 30 units BID, Jardiance 25 mg QD Current orders for Inpatient glycemic control: Novolog 0-15 units TID and Jardiance 25 mg every day, Had decadron 10 mg yesterday   Inpatient Diabetes Program Recommendations:    Please consider:  Semglee 30 units every day (50% of home dose).  Will continue to follow while inpatient.  Thank you, Dulce Sellar, MSN, CDCES Diabetes Coordinator Inpatient Diabetes Program [phone] (team pager from 8a-5p)

## 2023-06-25 NOTE — Progress Notes (Signed)
Pt returned from 5W grateful for visit

## 2023-06-25 NOTE — Progress Notes (Addendum)
  Progress Note    06/25/2023 7:14 AM 1 Day Post-Op  Subjective:  he did not sleep at all since getting out of surgery, having a lot of pain especially in left groin and proximal thigh   Vitals:   06/25/23 0016 06/25/23 0336  BP: 113/79 107/64  Pulse: 74 71  Resp: 20 17  Temp: 97.7 F (36.5 C) 97.9 F (36.6 C)  SpO2: 99% 97%   Physical Exam: Cardiac:  regular Lungs:  non labored Incisions:  left groin and AK popliteal incisions dressed, clean and dry. No swelling or hematoma Extremities:  LLE well perfused with palpable DP and PT pulses. Foot warm. Dry ulcer on left great toe Abdomen:  soft Neurologic: alert and oriented  CBC    Component Value Date/Time   WBC 11.5 (H) 06/25/2023 0342   RBC 4.83 06/25/2023 0342   HGB 14.5 06/25/2023 0342   HGB 15.7 06/09/2023 1055   HCT 44.4 06/25/2023 0342   HCT 49.0 06/09/2023 1055   PLT 131 (L) 06/25/2023 0342   PLT 115 (L) 06/09/2023 1055   MCV 91.9 06/25/2023 0342   MCV 97 06/09/2023 1055   MCH 30.0 06/25/2023 0342   MCHC 32.7 06/25/2023 0342   RDW 13.2 06/25/2023 0342   RDW 12.2 06/09/2023 1055   LYMPHSABS 2.1 06/01/2022 2113   MONOABS 0.6 06/01/2022 2113   EOSABS 0.2 06/01/2022 2113   BASOSABS 0.1 06/01/2022 2113    BMET    Component Value Date/Time   NA 131 (L) 06/25/2023 0342   K 4.5 06/25/2023 0342   CL 103 06/25/2023 0342   CO2 20 (L) 06/25/2023 0342   GLUCOSE 388 (H) 06/25/2023 0342   BUN 25 (H) 06/25/2023 0342   CREATININE 1.12 06/25/2023 0342   CALCIUM 8.3 (L) 06/25/2023 0342   GFRNONAA >60 06/25/2023 0342   GFRAA >90 07/11/2011 2256    INR    Component Value Date/Time   INR 1.1 06/24/2023 1130     Intake/Output Summary (Last 24 hours) at 06/25/2023 0714 Last data filed at 06/25/2023 0405 Gross per 24 hour  Intake 770.93 ml  Output 1825 ml  Net -1054.07 ml     Assessment/Plan:  52 y.o. male is s/p left femoral to AK popliteal bypass with PTFE 1 Day Post-Op   LLE well perfused and warm  with palpable DP and PT pulses Incisions are intact and well appearing Multimodal pain control. On chronic pain medication at home for OA Hemodynamically stable Hyperglycemia. Will reorder home meds. SSI PT/OT to eval with recs today  [name], PA-C [facility] [phone] 06/25/2023 7:14 AM  VASCULAR STAFF ADDENDUM: I have independently interviewed and examined the patient. I agree with the above.   Rande Brunt. Lenell Antu, MD [facility] Phone Number: [phone] 06/25/2023 1:56 PM

## 2023-06-25 NOTE — Evaluation (Signed)
Occupational Therapy Evaluation Patient Details Name: Michael Barnett MRN: [id number] DOB: 09/20/1970 Today's Date: 06/25/2023   History of Present Illness 52 y.o. male x/p Left common femoral to above knee popliteal bypass, with atherosclerosis of native arteries of left lower extremity causing ulceration, chronic limb threatening ischemia. PMH CAD, DM, AICD present, CHF   Clinical Impression   Pt c/o significant pain to L groin, states he has not been able to sleep since surgery last night. Pt lives alone in ground level apartment, states he has two steps but will walk through grass instead to get up steps. Pt educated on safety of using DME for assistance on unlevel ground, Pt acknowledged risks. Pt currently doing well overall, able to ambulate around room without AD, standing rest breaks due to pain, no LOB, supervision for safety. Pt will need min A for LB dressing, has friends/family who can assist if needed occasionally throughout each day. Pt would benefit from use of a tub bench at home to maximize independence and safety. Pt denies need for RW, cane, or BSC/toilet riser. Pt would benefit from skilled acute OT to maximize independence prior to return home, no follow up needed.     If plan is discharge home, recommend the following: A little help with walking and/or transfers;A little help with bathing/dressing/bathroom;Assist for transportation;Assistance with cooking/housework    Functional Status Assessment  Patient has had a recent decline in their functional status and demonstrates the ability to make significant improvements in function in a reasonable and predictable amount of time.  Equipment Recommendations  Tub/shower bench    Recommendations for Other Services       Precautions / Restrictions Precautions Precautions: Fall;ICD/Pacemaker Restrictions Weight Bearing Restrictions Per Provider Order: No      Mobility Bed Mobility Overal bed mobility: Modified  Independent             General bed mobility comments: increased time due to pain    Transfers Overall transfer level: Needs assistance Equipment used: None Transfers: Sit to/from Stand, Bed to chair/wheelchair/BSC Sit to Stand: Supervision           General transfer comment: supervision for safety, pain in L groin from surgical site limiting activity tolerance and gait      Balance Overall balance assessment: Needs assistance Sitting-balance support: No upper extremity supported, Feet supported Sitting balance-Leahy Scale: Good     Standing balance support: No upper extremity supported Standing balance-Leahy Scale: Fair Standing balance comment: limited due to pain at surgical site, L groin                           ADL either performed or assessed with clinical judgement   ADL Overall ADL's : Needs assistance/impaired Eating/Feeding: Independent   Grooming: Supervision/safety;Standing   Upper Body Bathing: Set up   Lower Body Bathing: Set up;Sitting/lateral leans   Upper Body Dressing : Independent   Lower Body Dressing: Minimal assistance;Sit to/from stand   Toilet Transfer: Supervision/safety   Toileting- Architect and Hygiene: Modified independent   Tub/ Engineer, structural: Supervision/safety;Tub bench   Functional mobility during ADLs: Supervision/safety General ADL Comments: close to baseline, will need a little extra help for LB ADLs due to pain at L groin from surgical site.     Vision Baseline Vision/History: 1 Wears glasses Ability to See in Adequate Light: 0 Adequate Patient Visual Report: No change from baseline       Perception  Praxis         Pertinent Vitals/Pain Pain Assessment Pain Assessment: 0-10 Pain Score: 10-Worst pain ever Pain Location: L groin/thigh Pain Descriptors / Indicators: Aching, Grimacing, Discomfort Pain Intervention(s): Monitored during session     Extremity/Trunk  Assessment Upper Extremity Assessment Upper Extremity Assessment: Overall WFL for tasks assessed           Communication Communication Communication: No apparent difficulties   Cognition Arousal: Alert Behavior During Therapy: WFL for tasks assessed/performed Overall Cognitive Status: Within Functional Limits for tasks assessed                                       General Comments       Exercises     Shoulder Instructions      Home Living Family/patient expects to be discharged to:: Private residence Living Arrangements: Alone Available Help at Discharge: Family;Friend(s);Available PRN/intermittently Type of Home: Apartment Home Access: Level entry     Home Layout: One level     Bathroom Shower/Tub: Tub/shower unit         Home Equipment: None   Additional Comments: Pt lives alone, family/friends can assist sometimes if needed      Prior Functioning/Environment Prior Level of Function : Independent/Modified Independent                        OT Problem List: Decreased range of motion;Decreased activity tolerance;Impaired balance (sitting and/or standing);Pain      OT Treatment/Interventions:      OT Goals(Current goals can be found in the care plan section) Acute Rehab OT Goals Patient Stated Goal: to return home OT Goal Formulation: With patient Time For Goal Achievement: 07/09/23 Potential to Achieve Goals: Good  OT Frequency:      Co-evaluation              AM-PAC OT "6 Clicks" Daily Activity     Outcome Measure Help from another person eating meals?: None Help from another person taking care of personal grooming?: A Little Help from another person toileting, which includes using toliet, bedpan, or urinal?: A Little Help from another person bathing (including washing, rinsing, drying)?: A Little Help from another person to put on and taking off regular upper body clothing?: None Help from another person to put on  and taking off regular lower body clothing?: A Little 6 Click Score: 20   End of Session Nurse Communication: Mobility status  Activity Tolerance: Patient tolerated treatment well Patient left: in bed;with call bell/phone within reach;with family/visitor present  OT Visit Diagnosis: Other abnormalities of gait and mobility (R26.89);Muscle weakness (generalized) (M62.81);Pain Pain - Right/Left: Left Pain - part of body: Hip                Time: 1610-9604 OT Time Calculation (min): 24 min Charges:  OT General Charges $OT Visit: 1 Visit OT Evaluation $OT Eval Low Complexity: 1 Low OT Treatments $Self Care/Home Management : 8-22 mins  [address] OTR/L   Alexis Goodell 06/25/2023, 2:03 PM

## 2023-06-25 NOTE — Progress Notes (Signed)
PHARMACIST LIPID MONITORING   Michael Barnett is a 52 y.o. male admitted on 06/24/2023 with L common femoral to above knee popliteal bypass.  Pharmacy has been consulted to optimize lipid-lowering therapy with the indication of secondary prevention for clinical ASCVD.  Recent Labs:  Lipid Panel (last 6 months):   Lab Results  Component Value Date   CHOL 103 06/25/2023   TRIG 75 06/25/2023   HDL 29 (L) 06/25/2023   CHOLHDL 3.6 06/25/2023   VLDL 15 06/25/2023   LDLCALC 59 06/25/2023    Hepatic function panel (last 6 months):   Lab Results  Component Value Date   AST 25 06/24/2023   ALT 24 06/24/2023   ALKPHOS 111 06/24/2023   BILITOT 1.1 06/24/2023    SCr (since admission):   Serum creatinine: 1.12 mg/dL 57/84/69 6295 Estimated creatinine clearance: 89.4 mL/min  Current therapy and lipid therapy tolerance Current lipid-lowering therapy: Atorvastatin 80 mg daily Previous lipid-lowering therapies (if applicable): Atorvastatin 10 mg, ezetimibe 10 mg  Documented or reported allergies or intolerances to lipid-lowering therapies (if applicable): None  Assessment:   Patient prefers no changes in lipid-lowering therapy at this time due to no indication but will work on dietary modificaitons  Plan:    1.Statin intensity (high intensity recommended for all patients regardless of the LDL):  No statin changes. The patient is already on a high intensity statin.  2.Add ezetimibe (if any one of the following):   Not indicated at this time.  3.Refer to lipid clinic:   No  4.Follow-up with:  Primary care provider - Pcp, No  5.Follow-up labs after discharge:  No changes in lipid therapy, repeat a lipid panel in one year.     Lennie Muckle, PharmD PGY1 Pharmacy Resident 06/25/2023 10:00 AM

## 2023-06-26 LAB — GLUCOSE, CAPILLARY
Glucose-Capillary: 252 mg/dL — ABNORMAL HIGH (ref 70–99)
Glucose-Capillary: 193 mg/dL — ABNORMAL HIGH (ref 70–99)
Glucose-Capillary: 194 mg/dL — ABNORMAL HIGH (ref 70–99)
Glucose-Capillary: 193 mg/dL — ABNORMAL HIGH (ref 70–99)

## 2023-06-26 NOTE — Progress Notes (Signed)
  Progress Note    06/26/2023 8:27 AM 2 Days Post-Op  Subjective:  pain better controlled. Able to ambulate in hallway yesterday   Vitals:   06/25/23 2331 06/26/23 0611  BP: 110/64 120/71  Pulse: 70 72  Resp: 18 16  Temp: 98.2 F (36.8 C) 98.5 F (36.9 C)  SpO2: 98% 96%   Physical Exam: Cardiac:  regular Lungs:  non labored Incisions:  Left groin and Left AK popliteal incisions are intact and well appearing, steri strips in place Extremities:  LLE well perfused and warm with palpable DP pulse Abdomen:  soft Neurologic: alert and oriented  CBC    Component Value Date/Time   WBC 11.5 (H) 06/25/2023 0342   RBC 4.83 06/25/2023 0342   HGB 14.5 06/25/2023 0342   HGB 15.7 06/09/2023 1055   HCT 44.4 06/25/2023 0342   HCT 49.0 06/09/2023 1055   PLT 131 (L) 06/25/2023 0342   PLT 115 (L) 06/09/2023 1055   MCV 91.9 06/25/2023 0342   MCV 97 06/09/2023 1055   MCH 30.0 06/25/2023 0342   MCHC 32.7 06/25/2023 0342   RDW 13.2 06/25/2023 0342   RDW 12.2 06/09/2023 1055   LYMPHSABS 2.1 06/01/2022 2113   MONOABS 0.6 06/01/2022 2113   EOSABS 0.2 06/01/2022 2113   BASOSABS 0.1 06/01/2022 2113    BMET    Component Value Date/Time   NA 131 (L) 06/25/2023 0342   K 4.5 06/25/2023 0342   CL 103 06/25/2023 0342   CO2 20 (L) 06/25/2023 0342   GLUCOSE 388 (H) 06/25/2023 0342   BUN 25 (H) 06/25/2023 0342   CREATININE 1.12 06/25/2023 0342   CALCIUM 8.3 (L) 06/25/2023 0342   GFRNONAA >60 06/25/2023 0342   GFRAA >90 07/11/2011 2256    INR    Component Value Date/Time   INR 1.1 06/24/2023 1130     Intake/Output Summary (Last 24 hours) at 06/26/2023 0827 Last data filed at 06/25/2023 2332 Gross per 24 hour  Intake --  Output 0 ml  Net 0 ml     Assessment/Plan:  52 y.o. male is s/p left femoral to AK popliteal bypass with PTFE  2 Days Post-Op   LLE well perfused and warm with palpable DP pulse Incisions are intact and well appearing Continue Pain control No PT/OT f/u  recommended Continue to mobilize as tolerated D/c once pain better controlled maybe another 1-2 days   [name], PA-C [facility] [phone] 06/26/2023 8:27 AM

## 2023-06-27 LAB — GLUCOSE, CAPILLARY
Glucose-Capillary: 265 mg/dL — ABNORMAL HIGH (ref 70–99)
Glucose-Capillary: 191 mg/dL — ABNORMAL HIGH (ref 70–99)
Glucose-Capillary: 245 mg/dL — ABNORMAL HIGH (ref 70–99)
Glucose-Capillary: 339 mg/dL — ABNORMAL HIGH (ref 70–99)

## 2023-06-27 MED ORDER — INSULIN ASPART 100 UNIT/ML IJ SOLN
10.0000 [IU] | Freq: Once | INTRAMUSCULAR | Status: AC
  Administered 2023-06-27: 10 [IU] via SUBCUTANEOUS

## 2023-06-27 MED ORDER — IBUPROFEN 400 MG PO TABS
800.0000 mg | ORAL_TABLET | Freq: Once | ORAL | Status: AC
  Administered 2023-06-27: 800 mg via ORAL
  Filled 2023-06-27: qty 2

## 2023-06-27 MED ORDER — BACLOFEN 10 MG PO TABS
10.0000 mg | ORAL_TABLET | Freq: Two times a day (BID) | ORAL | Status: DC
  Administered 2023-06-27 – 2023-06-28 (×3): 10 mg via ORAL
  Filled 2023-06-27 (×3): qty 1

## 2023-06-27 NOTE — Progress Notes (Addendum)
°  Progress Note    06/27/2023 8:36 AM 3 Days Post-Op  Subjective:  pain is improving. Says he walked a lot yesterday. Feels he will be ready to go home tomorrow   Vitals:   06/27/23 0348 06/27/23 0805  BP: 101/60 129/70  Pulse: 68 71  Resp: 15 18  Temp: 98.5 F (36.9 C) 98.1 F (36.7 C)  SpO2: 94%    Physical Exam: Cardiac:  regular Lungs:  non labored Incisions:  Left groin, left AK popliteal incisions are intact and well appearing, no drainage, swelling or hematoma Extremities:  Brisk left DP doppler signal. Left foot/ leg edematous Abdomen:  soft Neurologic: alert and oriented  CBC    Component Value Date/Time   WBC 11.5 (H) 06/25/2023 0342   RBC 4.83 06/25/2023 0342   HGB 14.5 06/25/2023 0342   HGB 15.7 06/09/2023 1055   HCT 44.4 06/25/2023 0342   HCT 49.0 06/09/2023 1055   PLT 131 (L) 06/25/2023 0342   PLT 115 (L) 06/09/2023 1055   MCV 91.9 06/25/2023 0342   MCV 97 06/09/2023 1055   MCH 30.0 06/25/2023 0342   MCHC 32.7 06/25/2023 0342   RDW 13.2 06/25/2023 0342   RDW 12.2 06/09/2023 1055   LYMPHSABS 2.1 06/01/2022 2113   MONOABS 0.6 06/01/2022 2113   EOSABS 0.2 06/01/2022 2113   BASOSABS 0.1 06/01/2022 2113    BMET    Component Value Date/Time   NA 131 (L) 06/25/2023 0342   K 4.5 06/25/2023 0342   CL 103 06/25/2023 0342   CO2 20 (L) 06/25/2023 0342   GLUCOSE 388 (H) 06/25/2023 0342   BUN 25 (H) 06/25/2023 0342   CREATININE 1.12 06/25/2023 0342   CALCIUM 8.3 (L) 06/25/2023 0342   GFRNONAA >60 06/25/2023 0342   GFRAA >90 07/11/2011 2256    INR    Component Value Date/Time   INR 1.1 06/24/2023 1130     Intake/Output Summary (Last 24 hours) at 06/27/2023 0836 Last data filed at 06/27/2023 0400 Gross per 24 hour  Intake 240 ml  Output --  Net 240 ml     Assessment/Plan:  52 y.o. male is s/p left femoral to AK popliteal bypass with PTFE  3 Days Post-Op   LLE remains well perfused and warm Incisions are intact and well  appearing Elevate leg while in bed/chair Told him need to wean off of the IV pain medication in order to work towards d/c Mobilize as tolerated Anticipate d/c home tomorrow   [name], [place] [facility] [phone] 06/27/2023 8:36 AM  VASCULAR STAFF ADDENDUM: I have independently interviewed and examined the patient. I agree with the above.   Rande Brunt. Lenell Antu, MD [facility] Phone Number: [phone] 06/27/2023 11:56 AM

## 2023-06-27 NOTE — Progress Notes (Signed)
Pt cbg is 339, pt has no night time coverage. MD notified.

## 2023-06-28 ENCOUNTER — Other Ambulatory Visit (HOSPITAL_COMMUNITY): Payer: Self-pay

## 2023-06-28 LAB — GLUCOSE, CAPILLARY: Glucose-Capillary: 154 mg/dL — ABNORMAL HIGH (ref 70–99)

## 2023-06-28 MED ORDER — TRUEDRAW LANCING DEVICE MISC
0 refills | Status: DC
  Filled 2023-06-28: qty 1, 30d supply, fill #0

## 2023-06-28 MED ORDER — VALSARTAN 80 MG PO TABS
80.0000 mg | ORAL_TABLET | Freq: Every day | ORAL | 3 refills | Status: DC
  Filled 2023-06-28: qty 30, 30d supply, fill #0

## 2023-06-28 MED ORDER — FINGERSTIX LANCETS MISC
0 refills | Status: DC
  Filled 2023-06-28: qty 100, 25d supply, fill #0

## 2023-06-28 MED ORDER — OXYCODONE HCL 5 MG PO TABS
5.0000 mg | ORAL_TABLET | Freq: Four times a day (QID) | ORAL | 0 refills | Status: DC | PRN
  Filled 2023-06-28: qty 20, 5d supply, fill #0

## 2023-06-28 MED ORDER — INSULIN GLARGINE 100 UNITS/ML SOLOSTAR PEN
30.0000 [IU] | PEN_INJECTOR | Freq: Two times a day (BID) | SUBCUTANEOUS | 11 refills | Status: DC
  Filled 2023-06-28: qty 15, 25d supply, fill #0

## 2023-06-28 MED ORDER — GLUCOSE BLOOD VI STRP
ORAL_STRIP | 12 refills | Status: AC
  Filled 2023-06-28: qty 50, 15d supply, fill #0

## 2023-06-28 MED ORDER — INSULIN STARTER KIT- PEN NEEDLES (ENGLISH)
1.0000 | Freq: Once | 0 refills | Status: AC
  Filled 2023-06-28: qty 1, 1d supply, fill #0

## 2023-06-28 NOTE — Plan of Care (Signed)

## 2023-06-28 NOTE — Discharge Summary (Incomplete)
  Bypass Discharge Summary Patient ID: Michael Barnett [id number] 52 y.o. 1970/06/30  Admit date: 06/24/2023  Discharge date and time: 06/28/2023 11:39 AM   Admitting Physician: Leonie Douglas, MD   Discharge Physician: Heath Lark, MD  Admission Diagnoses: PAD (peripheral artery disease) (HCC) [I73.9]  Discharge Diagnoses: PAD (peripheral artery disease) (HCC) [I73.9]  Admission Condition: {condition:18240}  Discharged Condition: {condition:18240}  Indication for Admission: ***  Hospital Course: ***  Consults: None  Treatments: {Tx:18249}   Disposition: Discharge disposition: 01-Home or Self Care       - For [facility] Registry use ---  Post-op:  Wound infection: No  Graft infection: No  Transfusion: No  If yes, 0 units given New Arrhythmia: No Patency judged by: [ ]  Dopper only, [ ]  Palpable graft pulse, Arly.Keller ] Palpable distal pulse, [ ]  ABI inc. > 0.15, [ ]  Duplex D/C Ambulatory Status: Ambulatory  Complications: MI: Arly.Keller ] No, [ ]  Troponin only, [ ]  EKG or Clinical CHF: No Resp failure: Arly.Keller ] none, [ ]  Pneumonia, [ ]  Ventilator Chg in renal function: Arly.Keller ] none, [ ]  Inc. Cr > 0.5, [ ]  Temp. Dialysis, [ ]  Permanent dialysis Stroke: [ X] None, [ ]  Minor, [ ]  Major Return to OR: No  Reason for return to OR: [ ]  Bleeding, [ ]  Infection, [ ]  Thrombosis, [ ]  Revision  Discharge medications: Statin use:  Yes ASA use:  Yes Plavix use:  No  for medical reason not indicated Beta blocker use: Yes Coumadin use: No  for medical reason not indicated    Patient Instructions:  Allergies as of 06/28/2023       Reactions   Victoza [liraglutide] Other (See Comments)   Gave pt pancreatitis     Med Rec must be completed prior to using this [facility]***        Discharge Care Instructions  (From admission, onward)           Start     Ordered   06/28/23 0000  Discharge wound care:       Comments: Wash incisions with mild soap and water, pat dry. Do not soak  in bathtub   06/28/23 0737           Activity: activity as tolerated, no driving while on analgesics, and no heavy lifting for 4-6 weeks Diet: regular diet and low fat, low cholesterol diet Wound Care: keep wound clean and dry. Okay to shower and wash incisions with mild soap and water, pat dry. Do not soak in bathtub  Follow-up with [facility] in 4 weeks with ABI and LLE bypass graft duplex  Signed: Keiera Strathman 06/28/2023 12:23 PM

## 2023-06-28 NOTE — Progress Notes (Signed)
Order received to discharge patient.  Telemetry monitor removed and CCMD made aware.  PIV access x2 removed without difficulty.  TOC meds retrieved from [facility] pharmacy.  Work note faxed and copy handed to patient per patient instructions. Discharge instructions, follow up, medications and instructions for their use discussed with patient.

## 2023-06-28 NOTE — Progress Notes (Addendum)
  Progress Note    06/28/2023 7:33 AM 4 Days Post-Op  Subjective:  feels ready to go home today   Vitals:   06/27/23 2335 06/28/23 0434  BP: 101/62 113/65  Pulse: 64 66  Resp:    Temp: 98.1 F (36.7 C) 98.1 F (36.7 C)  SpO2: 97% 97%   Physical Exam: Cardiac:  regular Lungs:  non labored Incisions:  left groin and AK popliteal incisions are intact and well appearing, clean and dry Extremities:  2+ left femoral and Left DP pulses present, left leg/foot edematous Abdomen:  soft Neurologic: alert and oriented  CBC    Component Value Date/Time   WBC 11.5 (H) 06/25/2023 0342   RBC 4.83 06/25/2023 0342   HGB 14.5 06/25/2023 0342   HGB 15.7 06/09/2023 1055   HCT 44.4 06/25/2023 0342   HCT 49.0 06/09/2023 1055   PLT 131 (L) 06/25/2023 0342   PLT 115 (L) 06/09/2023 1055   MCV 91.9 06/25/2023 0342   MCV 97 06/09/2023 1055   MCH 30.0 06/25/2023 0342   MCHC 32.7 06/25/2023 0342   RDW 13.2 06/25/2023 0342   RDW 12.2 06/09/2023 1055   LYMPHSABS 2.1 06/01/2022 2113   MONOABS 0.6 06/01/2022 2113   EOSABS 0.2 06/01/2022 2113   BASOSABS 0.1 06/01/2022 2113    BMET    Component Value Date/Time   NA 131 (L) 06/25/2023 0342   K 4.5 06/25/2023 0342   CL 103 06/25/2023 0342   CO2 20 (L) 06/25/2023 0342   GLUCOSE 388 (H) 06/25/2023 0342   BUN 25 (H) 06/25/2023 0342   CREATININE 1.12 06/25/2023 0342   CALCIUM 8.3 (L) 06/25/2023 0342   GFRNONAA >60 06/25/2023 0342   GFRAA >90 07/11/2011 2256    INR    Component Value Date/Time   INR 1.1 06/24/2023 1130     Intake/Output Summary (Last 24 hours) at 06/28/2023 0733 Last data filed at 06/28/2023 0435 Gross per 24 hour  Intake --  Output 5 ml  Net -5 ml     Assessment/Plan:  52 y.o. male is s/p  left femoral to AK popliteal bypass with PTFE  4 Days Post-Op   LLE well perfused and warm with palpable DP pulse Incisions are intact and well appearing Pain overall well controlled Encourage mobilizing as able   Elevate lower extremities to help with edema Rx for his home meds has been sent to [facility] pharmacy so that he has some refills until he can establish a new PCP in the area Stable for discharge home today Follow up has been arranged in 1 month with LLE bypass graft duplex and ABI   [name], PA-C [facility] [phone] 06/28/2023 7:33 AM  VASCULAR STAFF ADDENDUM: I have independently interviewed and examined the patient. I agree with the above.   Rande Brunt. Lenell Antu, MD [facility] Phone Number: [phone] 06/28/2023 11:32 AM

## 2023-06-28 NOTE — TOC Transition Note (Signed)
Transition of Care (TOC) - Discharge Note Donn Pierini RN, BSN Transitions of Care Unit 4E- RN Case Manager See Treatment Team for direct phone #   Patient Details  Name: Abimelec Diprima MRN: [id number] Date of Birth: [birth year]  Transition of Care [facility]) CM/SW Contact:  Darrold Span, RN Phone Number: 06/28/2023, 10:07 AM   Clinical Narrative:    Pt stable for transition home today, Noted no HH or DME needs. Pt has transportation home. TOC pharmacy to fill meds for discharge.   No TOC needs noted. Pt to f/u as per AVS instructions.    Final next level of care: Home/Self Care Barriers to Discharge: Barriers Resolved   Patient Goals and CMS Choice Patient states their goals for this hospitalization and ongoing recovery are:: return home   Choice offered to / list presented to : NA      Discharge Placement               Home        Discharge Plan and Services Additional resources added to the After Visit Summary for   In-house Referral: NA Discharge Planning Services: NA Post Acute Care Choice: NA            DME Agency: NA       HH Arranged: NA HH Agency: NA        Social Drivers of Health (SDOH) Interventions SDOH Screenings   Food Insecurity: No Food Insecurity (06/24/2023)  Housing: Low Risk  (06/24/2023)  Transportation Needs: No Transportation Needs (06/24/2023)  Utilities: Not At Risk (06/24/2023)  Financial Resource Strain: Low Risk  (03/25/2023)   Received from [facility]  Tobacco Use: High Risk (06/21/2023)     Readmission Risk Interventions    06/28/2023   10:07 AM  Readmission Risk Prevention Plan  Post Dischage Appt Complete  Medication Screening Complete  Transportation Screening Complete

## 2023-06-28 NOTE — Progress Notes (Signed)
AVS printed and explained to patient  all questioned answered.    Transport called to take to discharge lounge after TOC meds picked up.

## 2023-06-28 NOTE — Progress Notes (Signed)
  Progress Note    06/28/2023 10:48 AM 4 Days Post-Op  To whom this concerns,   Mr. Michael Barnett underwent surgery on 06/24/2023. He will need to remain out of work for 4 weeks for post operative recovery.    [name], PA-C [facility] [phone] 06/28/2023 10:48 AM

## 2023-07-01 ENCOUNTER — Encounter (HOSPITAL_COMMUNITY): Payer: Self-pay | Admitting: Emergency Medicine

## 2023-07-01 ENCOUNTER — Emergency Department (HOSPITAL_COMMUNITY): Payer: Medicaid Other

## 2023-07-01 ENCOUNTER — Emergency Department (HOSPITAL_COMMUNITY)
Admission: EM | Admit: 2023-07-01 | Discharge: 2023-07-01 | Discharge status: Against Medical Advice | Payer: Medicaid Other | Attending: Emergency Medicine | Admitting: Emergency Medicine

## 2023-07-01 DIAGNOSIS — R079 Chest pain, unspecified: Secondary | ICD-10-CM | POA: Diagnosis not present

## 2023-07-01 DIAGNOSIS — Z5321 Procedure and treatment not carried out due to patient leaving prior to being seen by health care provider: Secondary | ICD-10-CM | POA: Diagnosis not present

## 2023-07-01 DIAGNOSIS — M7989 Other specified soft tissue disorders: Secondary | ICD-10-CM | POA: Insufficient documentation

## 2023-07-01 DIAGNOSIS — Z9889 Other specified postprocedural states: Secondary | ICD-10-CM | POA: Insufficient documentation

## 2023-07-01 DIAGNOSIS — R062 Wheezing: Secondary | ICD-10-CM | POA: Insufficient documentation

## 2023-07-01 DIAGNOSIS — R066 Hiccough: Secondary | ICD-10-CM | POA: Diagnosis present

## 2023-07-01 LAB — BASIC METABOLIC PANEL
Anion gap: 8 (ref 5–15)
BUN: 17 mg/dL (ref 6–20)
CO2: 25 mmol/L (ref 22–32)
Calcium: 8.7 mg/dL — ABNORMAL LOW (ref 8.9–10.3)
Chloride: 103 mmol/L (ref 98–111)
Creatinine, Ser: 1.11 mg/dL (ref 0.61–1.24)
GFR, Estimated: >60 mL/min (ref >60)
Glucose, Bld: 347 mg/dL — ABNORMAL HIGH (ref 70–99)
Potassium: 4.5 mmol/L (ref 3.5–5.1)
Sodium: 136 mmol/L (ref 135–145)

## 2023-07-01 LAB — CBC
HCT: 47 % (ref 39.0–52.0)
Hemoglobin: 15.3 g/dL (ref 13.0–17.0)
MCH: 30.7 pg (ref 26.0–34.0)
MCHC: 32.6 g/dL (ref 30.0–36.0)
MCV: 94.4 fL (ref 80.0–100.0)
Platelets: 121 10*3/uL — ABNORMAL LOW (ref 150–400)
RBC: 4.98 MIL/uL (ref 4.22–5.81)
RDW: 13.1 % (ref 11.5–15.5)
WBC: 7.4 10*3/uL (ref 4.0–10.5)
nRBC: 0 % (ref 0.0–0.2)

## 2023-07-01 LAB — BRAIN NATRIURETIC PEPTIDE: B Natriuretic Peptide: 785.2 pg/mL — ABNORMAL HIGH (ref 0.0–100.0)

## 2023-07-01 NOTE — ED Notes (Signed)
 Attempted to update vitals; no answer in lobby x 2.

## 2023-07-01 NOTE — ED Triage Notes (Signed)
 Pt states had surgery on the 26th in left leg due to a blocked artery. Pt states has had hiccups for 7 days. Has been having chest pain and wheezing when laying down. Also states left foot has been swelling since surgery.

## 2023-07-02 ENCOUNTER — Encounter: Payer: Self-pay | Admitting: Internal Medicine

## 2023-07-02 ENCOUNTER — Telehealth: Payer: Self-pay

## 2023-07-02 NOTE — Telephone Encounter (Signed)
 Triage:  Ebony Washington  call on behalf of Michael Barnett stating he is have a lot of hiccups since surgery.  He had gone to the ER, had a few test and left.  He also went to urgent care today and felt better for a while afterwards.  She states he has does have acid reflux and he is passing gas, she states she will take him to the er in the morning if he is not better

## 2023-07-06 ENCOUNTER — Encounter (HOSPITAL_COMMUNITY): Payer: Medicaid Other

## 2023-07-06 ENCOUNTER — Encounter: Payer: Medicaid Other | Admitting: Vascular Surgery

## 2023-07-06 ENCOUNTER — Ambulatory Visit (INDEPENDENT_AMBULATORY_CARE_PROVIDER_SITE_OTHER): Payer: Medicaid Other | Admitting: Podiatry

## 2023-07-06 DIAGNOSIS — I739 Peripheral vascular disease, unspecified: Secondary | ICD-10-CM

## 2023-07-06 DIAGNOSIS — L84 Corns and callosities: Secondary | ICD-10-CM | POA: Diagnosis not present

## 2023-07-06 DIAGNOSIS — E1142 Type 2 diabetes mellitus with diabetic polyneuropathy: Secondary | ICD-10-CM | POA: Diagnosis not present

## 2023-07-06 NOTE — Progress Notes (Signed)
  Subjective:  Patient ID: Michael Barnett, male    DOB: 04-Jul-1970,   MRN: [id number]  Chief Complaint  Patient presents with   Wound Check    Pt presents for wound care skin ulcer of left great states he is doing well no drainage completely dry.    53 y.o. male presents for  follow-up of left great toe ulcer. Has undergone angiogram and fem pop  bypass and doing well.  Relates the toe is doing well.   PCP:  Pcp, No    . Denies any other pedal complaints. Denies n/v/f/c.   Past Medical History:  Diagnosis Date   AICD (automatic cardioverter/defibrillator) present    Arthritis    CHF (congestive heart failure) (HCC)    Coronary artery disease    Defibrillator activation    Diabetes mellitus    Hypertension    Myocardial infarction [facility])    Sleep apnea    Stroke (HCC)     Objective:  Physical Exam: Vascular: DP/PT pulses non palpable bilateral. CFT <3 seconds. Absent hair growth on digits. Edema noted to bilateral lower extremities. Xerosis noted bilaterally.  Skin. No lacerations or abrasions bilateral feet. Nails 1-5 bilateral  are thickened discolored and elongated with subungual debris. Hyperkeratotic lesions noted sub fourth metatarsal on left. Dorsal medial left hallux with hyperkeratosis and no underlying ulceration noted.  Musculoskeletal: MMT 5/5 bilateral lower extremities in DF, PF, Inversion and Eversion. Deceased ROM in DF of ankle joint.  Neurological: Sensation intact to light touch. Protective sensation diminished bilateral.    Assessment:   1. Pre-ulcerative calluses   2. Type 2 diabetes mellitus with peripheral neuropathy (HCC)   3. PAD (peripheral artery disease) (HCC)        Plan:  Patient was evaluated and treated and all questions answered. -Discussed and educated patient on diabetic foot care, especially with  regards to the vascular, neurological and musculoskeletal systems.  -Stressed the importance of good glycemic control and the detriment of  not  controlling glucose levels in relation to the foot. -Discussed supportive shoes at all times and checking feet regularly.  Ulcer dorsal left great toe -healed.   -Debridement of hyperkeratotic tissue without incident and no underlying ulceration as courtesy.  -No abx indicated.  -Discussed glucose control and proper protein-rich diet.  -Discussed if any worsening redness, pain, fever or chills to call or may need to report to the emergency room. Patient expressed understanding.   Return in 4 weeks for wound/callus check.    Return in about 4 weeks (around 08/03/2023) for wound check.   Asberry Failing, DPM

## 2023-07-12 ENCOUNTER — Ambulatory Visit: Payer: Medicaid Other | Attending: Internal Medicine | Admitting: Internal Medicine

## 2023-07-12 ENCOUNTER — Encounter: Payer: Self-pay | Admitting: Internal Medicine

## 2023-07-12 VITALS — BP 101/65 | HR 62 | Temp 97.9°F | Ht 69.0 in | Wt 229.0 lb

## 2023-07-12 DIAGNOSIS — F17210 Nicotine dependence, cigarettes, uncomplicated: Secondary | ICD-10-CM

## 2023-07-12 DIAGNOSIS — E1159 Type 2 diabetes mellitus with other circulatory complications: Secondary | ICD-10-CM | POA: Diagnosis not present

## 2023-07-12 DIAGNOSIS — I5042 Chronic combined systolic (congestive) and diastolic (congestive) heart failure: Secondary | ICD-10-CM | POA: Diagnosis not present

## 2023-07-12 DIAGNOSIS — Z7689 Persons encountering health services in other specified circumstances: Secondary | ICD-10-CM

## 2023-07-12 DIAGNOSIS — I739 Peripheral vascular disease, unspecified: Secondary | ICD-10-CM | POA: Diagnosis not present

## 2023-07-12 DIAGNOSIS — R600 Localized edema: Secondary | ICD-10-CM

## 2023-07-12 DIAGNOSIS — Z794 Long term (current) use of insulin: Secondary | ICD-10-CM

## 2023-07-12 DIAGNOSIS — I2510 Atherosclerotic heart disease of native coronary artery without angina pectoris: Secondary | ICD-10-CM | POA: Diagnosis not present

## 2023-07-12 DIAGNOSIS — Z2821 Immunization not carried out because of patient refusal: Secondary | ICD-10-CM

## 2023-07-12 DIAGNOSIS — Z1211 Encounter for screening for malignant neoplasm of colon: Secondary | ICD-10-CM

## 2023-07-12 DIAGNOSIS — F17200 Nicotine dependence, unspecified, uncomplicated: Secondary | ICD-10-CM

## 2023-07-12 DIAGNOSIS — Z09 Encounter for follow-up examination after completed treatment for conditions other than malignant neoplasm: Secondary | ICD-10-CM

## 2023-07-12 DIAGNOSIS — E119 Type 2 diabetes mellitus without complications: Secondary | ICD-10-CM

## 2023-07-12 DIAGNOSIS — Z7984 Long term (current) use of oral hypoglycemic drugs: Secondary | ICD-10-CM

## 2023-07-12 MED ORDER — FREESTYLE LIBRE 3 READER DEVI: 0 refills | Status: DC

## 2023-07-12 MED ORDER — INSULIN GLARGINE 100 UNITS/ML SOLOSTAR PEN: 15.0000 [IU] | PEN_INJECTOR | Freq: Every day | SUBCUTANEOUS | Status: DC

## 2023-07-12 MED ORDER — NICOTINE 21 MG/24HR TD PT24: 21.0000 mg | MEDICATED_PATCH | Freq: Every day | TRANSDERMAL | 1 refills | Status: DC

## 2023-07-12 MED ORDER — FREESTYLE LIBRE 3 PLUS SENSOR MISC: 6 refills | Status: DC

## 2023-07-12 NOTE — Patient Instructions (Signed)
 Take Lantus insulin 15 units daily at bedtime. Prescription sent to your pharmacy for continuous glucose monitor. Prescription sent to your pharmacy for the nicotine patches 21 mg. You will receive the Cologuard kit in the mail.

## 2023-07-12 NOTE — Progress Notes (Signed)
 Patient ID: Michael Barnett, male    DOB: [birth year]  MRN: [id number]  CC: Hospitalization Follow-up (Hospitalization f/u. Med refill. Layvonne insulin  pen needle refill - True metrix blood glucose test strips/No to all vax)   Subjective: Michael Barnett is a 53 y.o. male who presents for new hospital follow-up visit. His male friend Bobetta is with him.  His concerns today include:  Patient with history of PAD, CAD status post CABG x 2/ICM/ICD, aortic atherosclerosis, CHF (EF 45 to 50%,GD III DD), DM type 2, HTN, OSA, CVA (pt does not recall date), tobacco dependence  Patient was hospitalized 12/26-30/2024 left common femoral to above-knee popliteal bypass surgery by Dr. Vonzell.  Left leg was well-perfused with palpable PAD and PT pulses on POD 1.  He was discharged home in a stable condition.  Today: PAD: He has follow-up appointment with the vascular surgeon Dr. Magda on the 24th of this month.  He states he is doing well.  Left groin is still a little sore but getting better.  He is swelling in the left leg since surgery.  His male friend states that the swelling has decreased some.  He is wondering whether he needs compression socks.  DM: Lab Results  Component Value Date   HGBA1C 9.1 (H) 06/24/2023  He is supposed to be on Lantus  30 units twice a day and Jardiance  25 mg daily.  He has not been taking the Lantus  consistently because sometimes blood sugars are low.  He states that he is figured out how to manage his blood sugars with better eating habits and taking the Jardiance .  He checks blood sugars 3 times a day.  Reports range before breakfast is between 97-123, before lunch between 115-120 and before dinner between 115-120.  He does not have a log book with him.  He states that he has lost weight due to better eating habits from 287 pounds down to 229 pounds.  CAD/CHF: Reports compliance with taking baby aspirin , Lipitor  80 mg daily, Diovan  80 mg daily, Jardiance  25 mg  daily.  Torsemide  20 mg is on his medication list.  Patient states he takes that only when need to as it causes his potassium to drop low.  Denies any chest pains, shortness of breath.  He limits salt in the foods. I note so resolved is also on his medication list but patient reported not taking. Followed by cardiology Dr. Wendel  Tobacco dependence: Smokes about half a pack of cigarettes a day since the eighth grade.  He had quit for 3 months after his open heart surgery but then resumed.  He denies any street drug use or alcohol use.  Patient Active Problem List   Diagnosis Date Noted   PAD (peripheral artery disease) (HCC) 06/24/2023     Current Outpatient Medications on File Prior to Visit  Medication Sig Dispense Refill   aspirin  EC 81 MG tablet Take 81 mg by mouth daily. Swallow whole.     atorvastatin  (LIPITOR ) 80 MG tablet Take 80 mg by mouth daily.     empagliflozin  (JARDIANCE ) 25 MG TABS tablet Take 25 mg by mouth daily.     Fingerstix Lancets MISC Use as directed with testing supplies up to 4 times daily. 100 each 0   glucose blood test strip Use as instructed 100 each 12   Lancet Devices (TRUEDRAW LANCING DEVICE) MISC Use as directed up to four times daily 1 each 0   oxyCODONE  (OXY IR/ROXICODONE ) 5 MG immediate release tablet Take  1 tablet (5 mg total) by mouth every 6 (six) hours as needed for breakthrough pain. 20 tablet 0   oxyCODONE -acetaminophen  (PERCOCET) 10-325 MG tablet Take 1 tablet by mouth every 4 (four) hours as needed for pain.     torsemide  (DEMADEX ) 20 MG tablet Take 20 mg by mouth daily as needed (swelling).     valsartan  (DIOVAN ) 80 MG tablet Take 1 tablet (80 mg total) by mouth daily. 30 tablet 3   nitroGLYCERIN  (NITROSTAT ) 0.4 MG SL tablet Place 0.4 mg under the tongue every 5 (five) minutes as needed for chest pain. (Patient not taking: Reported on 07/12/2023)     sotalol  (BETAPACE ) 160 MG tablet Take 160 mg by mouth 2 (two) times daily. (Patient not taking:  Reported on 07/12/2023)     No current facility-administered medications on file prior to visit.    Allergies  Allergen Reactions   Victoza [Liraglutide] Other (See Comments)    Gave pt pancreatitis    Social History   Socioeconomic History   Marital status: Single    Spouse name: Not on file   Number of children: Not on file   Years of education: Not on file   Highest education level: Not on file  Occupational History   Not on file  Tobacco Use   Smoking status: Every Day    Current packs/day: 0.50    Average packs/day: 0.5 packs/day for 35.0 years (17.5 ttl pk-yrs)    Types: Cigarettes    Start date: 74   Smokeless tobacco: Not on file  Vaping Use   Vaping status: Never Used  Substance and Sexual Activity   Alcohol use: No   Drug use: Never   Sexual activity: Yes  Other Topics Concern   Not on file  Social History Narrative   Not on file   Social Drivers of Health   Financial Resource Strain: Low Risk  (03/25/2023)   Received from [facility] [employer] Strain (CARDIA)    Difficulty of Paying Living Expenses: Not hard at all  Food Insecurity: No Food Insecurity (06/24/2023)   Hunger Vital Sign    Worried About Running Out of Food in the Last Year: Never true    Ran Out of Food in the Last Year: Never true  Transportation Needs: No Transportation Needs (06/24/2023)   PRAPARE - Administrator, Civil Service (Medical): No    Lack of Transportation (Non-Medical): No  Physical Activity: Not on file  Stress: Not on file  Social Connections: Not on file  Intimate Partner Violence: Not At Risk (06/24/2023)   Humiliation, Afraid, Rape, and Kick questionnaire    Fear of Current or Ex-Partner: No    Emotionally Abused: No    Physically Abused: No    Sexually Abused: No    Family History  Problem Relation Age of Onset   Heart attack Mother    Heart attack Father    Heart attack Maternal Uncle     Past Surgical  History:  Procedure Laterality Date   ABDOMINAL AORTOGRAM W/LOWER EXTREMITY N/A 06/04/2023   Procedure: ABDOMINAL AORTOGRAM W/LOWER EXTREMITY;  Surgeon: Magda Debby SAILOR, MD;  Location: [facility] INVASIVE CV LAB;  Service: Cardiovascular;  Laterality: N/A;   CORONARY ARTERY BYPASS GRAFT     FEMORAL-POPLITEAL BYPASS GRAFT Left 06/24/2023   Procedure: BYPASS GRAFT FEMORAL- ABOVE KNEE POPLITEAL ARTERY;  Surgeon: Magda Debby SAILOR, MD;  Location: [facility] OR;  Service: Vascular;  Laterality: Left;   RIGHT/LEFT  HEART CATH AND CORONARY/GRAFT ANGIOGRAPHY N/A 06/15/2023   Procedure: RIGHT/LEFT HEART CATH AND CORONARY/GRAFT ANGIOGRAPHY;  Surgeon: Wonda Sharper, MD;  Location: [facility] INVASIVE CV LAB;  Service: Cardiovascular;  Laterality: N/A;    ROS: Review of Systems Negative except as stated above  PHYSICAL EXAM: BP 101/65 (BP Location: Left Arm, Patient Position: Sitting, Cuff Size: Normal)   Pulse 62   Temp 97.9 F (36.6 C) (Oral)   Ht 5' 9 (1.753 m)   Wt 229 lb (103.9 kg)   SpO2 99%   BMI 33.82 kg/m   Physical Exam   General appearance - alert, well appearing, middle-age African-American male and in no distress Mental status - normal mood, behavior, speech, dress, motor activity, and thought processes Neck - supple, no significant adenopathy Chest -breath sounds slightly decreased bilaterally but no crackles or wheezes heard. Heart - normal rate, regular rhythm, normal S1, S2, no murmurs, rubs, clicks or gallops Extremities -patient with 2+ edema in the left lower leg.     Latest Ref Rng & Units 07/01/2023    1:00 PM 06/25/2023    3:42 AM 06/24/2023    7:53 PM  CMP  Glucose 70 - 99 mg/dL 652  611    BUN 6 - 20 mg/dL 17  25    Creatinine 9.38 - 1.24 mg/dL 8.88  8.87  8.87   Sodium 135 - 145 mmol/L 136  131    Potassium 3.5 - 5.1 mmol/L 4.5  4.5    Chloride 98 - 111 mmol/L 103  103    CO2 22 - 32 mmol/L 25  20    Calcium  8.9 - 10.3 mg/dL 8.7  8.3     Lipid Panel     Component Value  Date/Time   CHOL 103 06/25/2023 0342   TRIG 75 06/25/2023 0342   HDL 29 (L) 06/25/2023 0342   CHOLHDL 3.6 06/25/2023 0342   VLDL 15 06/25/2023 0342   LDLCALC 59 06/25/2023 0342    CBC    Component Value Date/Time   WBC 7.4 07/01/2023 1300   RBC 4.98 07/01/2023 1300   HGB 15.3 07/01/2023 1300   HGB 15.7 06/09/2023 1055   HCT 47.0 07/01/2023 1300   HCT 49.0 06/09/2023 1055   PLT 121 (L) 07/01/2023 1300   PLT 115 (L) 06/09/2023 1055   MCV 94.4 07/01/2023 1300   MCV 97 06/09/2023 1055   MCH 30.7 07/01/2023 1300   MCHC 32.6 07/01/2023 1300   RDW 13.1 07/01/2023 1300   RDW 12.2 06/09/2023 1055   LYMPHSABS 2.1 06/01/2022 2113   MONOABS 0.6 06/01/2022 2113   EOSABS 0.2 06/01/2022 2113   BASOSABS 0.1 06/01/2022 2113    ASSESSMENT AND PLAN: 1. Establishing care with new doctor, encounter for (Primary)   2. Hospital discharge follow-up   3. Type 2 diabetes mellitus with other circulatory complication, with long-term current use of insulin  (HCC) Recent A1c done 2 weeks ago was 9.1.  Patient reports his blood sugars have been better due to improvement in his eating habits.  He is supposed to be on Lantus  30 units twice a day but has not been taking consistently.  He has been taking the Jardiance  25 mg daily consistently.  I recommend taking Lantus  15 units daily at bedtime.  We discussed getting him a continuous glucose monitor which will alert him if blood sugars are dropping low.  He is agreeable to this. - Continuous Glucose Sensor (FREESTYLE LIBRE 3 PLUS SENSOR) MISC; Change sensor every 15 days.  Dispense: 2 each; Refill: 6 - Continuous Glucose Receiver (FREESTYLE LIBRE 3 READER) DEVI; UAD to monitor blood sugar  Dispense: 1 each; Refill: 0 - Microalbumin / creatinine urine ratio - insulin  glargine (LANTUS ) 100 unit/mL SOPN; Inject 15 Units into the skin at bedtime.  4. Diabetes mellitus treated with oral medication (HCC) See #3 above.  5. PAD (peripheral artery disease)  (HCC) Continue aspirin  and atorvastatin .  Keep upcoming appointment with vascular surgeon for follow-up postsurgery  6. Edema of left lower leg Patient reports that he has had some swelling in the leg postsurgery and states he was told to expect this.  However I think we still need to proceed with getting a Doppler ultrasound to rule out DVT. - VAS US  LOWER EXTREMITY VENOUS (DVT); Future  7. Coronary artery disease involving native coronary artery of native heart without angina pectoris Stable.  Continue aspirin , atorvastatin .  Unclear whether he is supposed to be on sotalol  or not.  Can address when he sees his cardiologist in f/u  8. Chronic combined systolic and diastolic CHF, NYHA class 2 (HCC) Stable.  Continue Jardiance , Diovan .  9. Tobacco dependence Strongly encouraged him to quit.  He desires to quit.  He is willing to try the nicotine  patches.  Will do the stepdown approach with him. - nicotine  (NICODERM CQ  - DOSED IN MG/24 HOURS) 21 mg/24hr patch; Place 1 patch (21 mg total) onto the skin daily.  Dispense: 28 patch; Refill: 1  10. Screening for colon cancer Discussed colon cancer screening methods.  He prefers Cologuard test. - Cologuard  11. Influenza vaccination declined   12. Pneumococcal vaccination declined   Patient was given the opportunity to ask questions.  Patient verbalized understanding of the plan and was able to repeat key elements of the plan.   This documentation was completed using Paediatric nurse.  Any transcriptional errors are unintentional.  Orders Placed This Encounter  Procedures   Microalbumin / creatinine urine ratio   Cologuard   VAS US  LOWER EXTREMITY VENOUS (DVT)     Requested Prescriptions   Signed Prescriptions Disp Refills   Continuous Glucose Sensor (FREESTYLE LIBRE 3 PLUS SENSOR) MISC 2 each 6    Sig: Change sensor every 15 days.   Continuous Glucose Receiver (FREESTYLE LIBRE 3 READER) DEVI 1 each 0    Sig:  UAD to monitor blood sugar   nicotine  (NICODERM CQ  - DOSED IN MG/24 HOURS) 21 mg/24hr patch 28 patch 1    Sig: Place 1 patch (21 mg total) onto the skin daily.   insulin  glargine (LANTUS ) 100 unit/mL SOPN      Sig: Inject 15 Units into the skin at bedtime.    Return in about 4 months (around 11/09/2023).  Barnie Louder, MD, FACP

## 2023-07-13 ENCOUNTER — Encounter: Payer: Self-pay | Admitting: Internal Medicine

## 2023-07-13 ENCOUNTER — Telehealth: Payer: Self-pay

## 2023-07-13 ENCOUNTER — Encounter (HOSPITAL_COMMUNITY): Payer: Medicaid Other

## 2023-07-13 ENCOUNTER — Telehealth: Payer: Self-pay | Admitting: Internal Medicine

## 2023-07-13 ENCOUNTER — Other Ambulatory Visit: Payer: Self-pay

## 2023-07-13 NOTE — Telephone Encounter (Signed)
 Give patient until tomorrow and then call again for an update.  If he has not been able to get it changed over, let him know that we can still go ahead and schedule the Doppler ultrasound but he will be billed for the cost.  I suggest getting it done to check for any blood clots in the leg.

## 2023-07-13 NOTE — Telephone Encounter (Signed)
 Spoke with [employer] to request PA for [id number] - VAS US  LOWER EXTREMITY VENOUS (DVT) . Lolita Bora representative voiced that she could not locate the patient based on the information that was provided. Verified information per what was on patient medicaid card scan in chart on 06/01/2023. Lolita voiced she still was unable to located patient. Spoke with Tinnie Ill at [facility] DSS Per Tinnie Ill because his medicaid coverage  is listed in [facility] and he is on a [facility] Managed Care that is not active here, [facility].  Since  he has moved to [facility] then he needs to request a county transfer from [place] DSS to [facility] DSS. Patient notified that his Medicaid is not active here and needs to be transferred.

## 2023-07-13 NOTE — Telephone Encounter (Signed)
 Pharmacy Patient Advocate Encounter   Received notification from CoverMyMeds that prior authorization for FREESTYLE LIBRE 3 PLUS SENSORS AND RECEIVER is required/requested.   Insurance verification completed.   The patient is insured through  [place] COMPLETE MEDICAID  .   Per test claim: PA required and submitted KEY/EOC/Request #: PEER-TO-PEER REVIEW REQUESTED TICKET #[id number] DENIED.  INITIAL REJECTION MISSING INFORMATION. PEER-TO-PEER REQUESTED, EXPECTING CALL BACK FROM INS REP 07/14/2023 BETWEEN 2PM-5PM

## 2023-07-13 NOTE — Telephone Encounter (Signed)
 Copied from CRM [id number]. Topic: General - Other >> Jul 13, 2023 12:11 PM Rosaria BRAVO wrote: Reason for CRM: Precious called from [facility], wanting to know if the patient uses an insulin  pump with their freestyle Libre? Wants to speak to the clinic.   Disconnected before confirming call back, called from this number:  [phone]

## 2023-07-13 NOTE — Telephone Encounter (Signed)
 Spoke with patient . Advised patient that we are unable to schedule his VAS US  LOWER EXTREMITY VENOUS (DVT) because his medicaid coverage  is listed in [facility] and he is on a [facility] Managed Care that is not active here, [facility].  Since  he has moved to [facility] then he needs to request a county transfer from [place] DSS to [facility] DSS.   Patient voiced understanding. Stated he would call them today and let us  know when it has been done.

## 2023-07-14 ENCOUNTER — Ambulatory Visit (HOSPITAL_COMMUNITY)
Admission: RE | Admit: 2023-07-14 | Discharge: 2023-07-14 | Disposition: A | Discharge status: Home / Self Care | Payer: Medicaid Other | Source: Ambulatory Visit | Attending: Internal Medicine | Admitting: Internal Medicine

## 2023-07-14 ENCOUNTER — Other Ambulatory Visit: Payer: Self-pay

## 2023-07-14 DIAGNOSIS — R600 Localized edema: Secondary | ICD-10-CM | POA: Insufficient documentation

## 2023-07-14 LAB — MICROALBUMIN / CREATININE URINE RATIO
Creatinine, Urine: 70.1 mg/dL
Microalb/Creat Ratio: 42 mg/g{creat} — ABNORMAL HIGH (ref 0–29)
Microalbumin, Urine: 29.2 ug/mL

## 2023-07-14 NOTE — Telephone Encounter (Signed)
 Called & spoke to the patient. Verified name & DOB. Inquired if patient has been able to speak to DSS to switch his insurance but patient stated that he has not had the chance to do so. Informed patient that we can still schedule the US  but he will be billed for the cost if he agrees to do so. It is recommended for his leg. Patient agreed to continue with the US  due to his leg continuing to "bother him". He stated that he will still try to switch his medicaid very soon in hopes that it will be covered. Informed patient that there is no guarantee that Medicaid will be retroactive in coverage. Patient expressed verbal understanding and would like to proceed with the US .  Doppler US  scheduled for 07/14/2023 at [facility]. Patient confirmed appointment.   FYI

## 2023-07-14 NOTE — Telephone Encounter (Signed)
 Called & spoke to a representative who stated that there is currently no order or notes in reference to that inquiry. No further assistance needed at this time.

## 2023-07-14 NOTE — Telephone Encounter (Signed)
 Not on insulin  pump. Send form if needed to be completed.

## 2023-07-15 ENCOUNTER — Telehealth: Payer: Self-pay | Admitting: Internal Medicine

## 2023-07-15 NOTE — Telephone Encounter (Signed)
Phone call placed to patient today.  I informed patient that the Doppler ultrasound of the left leg was negative for blood clot.  However it did show a cystic structure in the distal thigh area.  I told him that I had put a call to the vascular surgeon Dr. Myra Gianotti who read the study and is waiting for him to call me back.  I want to get his take on what he thinks the cystic structure may be to determine whether we need to do any further advanced imaging.  I will get back to him once I hear from him.

## 2023-07-16 ENCOUNTER — Other Ambulatory Visit: Payer: Self-pay

## 2023-07-16 ENCOUNTER — Telehealth: Payer: Self-pay | Admitting: Internal Medicine

## 2023-07-16 DIAGNOSIS — I70245 Atherosclerosis of native arteries of left leg with ulceration of other part of foot: Secondary | ICD-10-CM

## 2023-07-16 DIAGNOSIS — R600 Localized edema: Secondary | ICD-10-CM

## 2023-07-16 NOTE — Telephone Encounter (Signed)
Pharmacy Patient Advocate Encounter  Received notification from  [place] [employer]PLE[employer] MEDICA[employer]  that Prior Authorization for FREESTYLE LIBRE 3 PLUS SENSORS has been APPROVED from 07/14/2023 to 01/10/2024   PA #/Case ID/Reference #: [id number]

## 2023-07-16 NOTE — Telephone Encounter (Signed)
Phone call placed to patient this evening.  I told him that I was able to speak with Dr. Myra Gianotti today regarding the Doppler ultrasound.  He said that the cystic structure that was seen in the distal thigh is most likely result of the surgery and not unusual to see it after this type of surgery.  He also stated that some swelling also is not unusual and recommended that patient get a pair of compression socks below the knee and wear them.  All questions were answered.  Advised patient to keep his follow-up appointment next week with the specialist.

## 2023-07-19 ENCOUNTER — Other Ambulatory Visit: Payer: Self-pay

## 2023-07-23 ENCOUNTER — Ambulatory Visit (HOSPITAL_COMMUNITY)
Admission: RE | Admit: 2023-07-23 | Discharge: 2023-07-23 | Disposition: A | Discharge status: Home / Self Care | Payer: Medicaid Other | Source: Ambulatory Visit | Attending: Vascular Surgery | Admitting: Vascular Surgery

## 2023-07-23 ENCOUNTER — Ambulatory Visit (INDEPENDENT_AMBULATORY_CARE_PROVIDER_SITE_OTHER)
Admission: RE | Admit: 2023-07-23 | Discharge: 2023-07-23 | Disposition: A | Discharge status: Home / Self Care | Payer: Medicaid Other | Source: Ambulatory Visit | Attending: Vascular Surgery | Admitting: Vascular Surgery

## 2023-07-23 DIAGNOSIS — I70245 Atherosclerosis of native arteries of left leg with ulceration of other part of foot: Secondary | ICD-10-CM

## 2023-07-23 DIAGNOSIS — R600 Localized edema: Secondary | ICD-10-CM | POA: Insufficient documentation

## 2023-07-23 LAB — VAS US ABI WITH/WO TBI
Left ABI: 0.67
Right ABI: 0.62

## 2023-07-24 ENCOUNTER — Other Ambulatory Visit: Payer: Self-pay

## 2023-07-24 ENCOUNTER — Encounter (HOSPITAL_COMMUNITY): Payer: Self-pay

## 2023-07-24 ENCOUNTER — Emergency Department (HOSPITAL_COMMUNITY): Payer: Medicaid Other

## 2023-07-24 ENCOUNTER — Observation Stay (HOSPITAL_COMMUNITY)
Admission: EM | Admit: 2023-07-24 | Discharge: 2023-07-25 | Disposition: A | Discharge status: Home / Self Care | Payer: Medicaid Other | Attending: Cardiovascular Disease | Admitting: Cardiovascular Disease

## 2023-07-24 DIAGNOSIS — Z4502 Encounter for adjustment and management of automatic implantable cardiac defibrillator: Secondary | ICD-10-CM

## 2023-07-24 DIAGNOSIS — I10 Essential (primary) hypertension: Secondary | ICD-10-CM | POA: Insufficient documentation

## 2023-07-24 DIAGNOSIS — I255 Ischemic cardiomyopathy: Secondary | ICD-10-CM | POA: Insufficient documentation

## 2023-07-24 DIAGNOSIS — Z7901 Long term (current) use of anticoagulants: Secondary | ICD-10-CM | POA: Diagnosis not present

## 2023-07-24 DIAGNOSIS — I472 Ventricular tachycardia, unspecified: Principal | ICD-10-CM | POA: Diagnosis present

## 2023-07-24 DIAGNOSIS — Z95810 Presence of automatic (implantable) cardiac defibrillator: Secondary | ICD-10-CM | POA: Insufficient documentation

## 2023-07-24 DIAGNOSIS — Z794 Long term (current) use of insulin: Secondary | ICD-10-CM | POA: Diagnosis not present

## 2023-07-24 DIAGNOSIS — I493 Ventricular premature depolarization: Secondary | ICD-10-CM | POA: Diagnosis not present

## 2023-07-24 DIAGNOSIS — I2510 Atherosclerotic heart disease of native coronary artery without angina pectoris: Secondary | ICD-10-CM | POA: Insufficient documentation

## 2023-07-24 DIAGNOSIS — E119 Type 2 diabetes mellitus without complications: Secondary | ICD-10-CM | POA: Diagnosis not present

## 2023-07-24 DIAGNOSIS — Z8679 Personal history of other diseases of the circulatory system: Secondary | ICD-10-CM | POA: Insufficient documentation

## 2023-07-24 DIAGNOSIS — F17210 Nicotine dependence, cigarettes, uncomplicated: Secondary | ICD-10-CM | POA: Diagnosis not present

## 2023-07-24 DIAGNOSIS — Z79899 Other long term (current) drug therapy: Secondary | ICD-10-CM | POA: Diagnosis not present

## 2023-07-24 DIAGNOSIS — Z8673 Personal history of transient ischemic attack (TIA), and cerebral infarction without residual deficits: Secondary | ICD-10-CM | POA: Diagnosis not present

## 2023-07-24 DIAGNOSIS — Z951 Presence of aortocoronary bypass graft: Secondary | ICD-10-CM | POA: Diagnosis not present

## 2023-07-24 DIAGNOSIS — I480 Paroxysmal atrial fibrillation: Secondary | ICD-10-CM | POA: Insufficient documentation

## 2023-07-24 LAB — CREATININE, SERUM
Creatinine, Ser: 1.14 mg/dL (ref 0.61–1.24)
GFR, Estimated: >60 mL/min (ref >60)

## 2023-07-24 LAB — CBC
HCT: 48.3 % (ref 39.0–52.0)
HCT: 46.1 % (ref 39.0–52.0)
Hemoglobin: 15.9 g/dL (ref 13.0–17.0)
Hemoglobin: 15 g/dL (ref 13.0–17.0)
MCH: 31.1 pg (ref 26.0–34.0)
MCH: 30.1 pg (ref 26.0–34.0)
MCHC: 32.9 g/dL (ref 30.0–36.0)
MCHC: 32.5 g/dL (ref 30.0–36.0)
MCV: 94.3 fL (ref 80.0–100.0)
MCV: 92.6 fL (ref 80.0–100.0)
Platelets: 89 10*3/uL — ABNORMAL LOW (ref 150–400)
Platelets: 93 10*3/uL — ABNORMAL LOW (ref 150–400)
RBC: 5.12 MIL/uL (ref 4.22–5.81)
RBC: 4.98 MIL/uL (ref 4.22–5.81)
RDW: 13.7 % (ref 11.5–15.5)
RDW: 13.7 % (ref 11.5–15.5)
WBC: 4.6 10*3/uL (ref 4.0–10.5)
WBC: 3.9 10*3/uL — ABNORMAL LOW (ref 4.0–10.5)
nRBC: 0 % (ref 0.0–0.2)
nRBC: 0 % (ref 0.0–0.2)

## 2023-07-24 LAB — BASIC METABOLIC PANEL
Anion gap: 11 (ref 5–15)
BUN: 19 mg/dL (ref 6–20)
CO2: 24 mmol/L (ref 22–32)
Calcium: 8.7 mg/dL — ABNORMAL LOW (ref 8.9–10.3)
Chloride: 101 mmol/L (ref 98–111)
Creatinine, Ser: 1.23 mg/dL (ref 0.61–1.24)
GFR, Estimated: >60 mL/min (ref >60)
Glucose, Bld: 222 mg/dL — ABNORMAL HIGH (ref 70–99)
Potassium: 4.2 mmol/L (ref 3.5–5.1)
Sodium: 136 mmol/L (ref 135–145)

## 2023-07-24 LAB — TROPONIN I (HIGH SENSITIVITY)
Troponin I (High Sensitivity): 40 ng/L — ABNORMAL HIGH (ref <18)
Troponin I (High Sensitivity): 61 ng/L — ABNORMAL HIGH (ref <18)

## 2023-07-24 LAB — GLUCOSE, CAPILLARY: Glucose-Capillary: 306 mg/dL — ABNORMAL HIGH (ref 70–99)

## 2023-07-24 MED ORDER — HEPARIN SODIUM (PORCINE) 5000 UNIT/ML IJ SOLN
5000.0000 [IU] | Freq: Three times a day (TID) | INTRAMUSCULAR | Status: DC
  Administered 2023-07-24: 5000 [IU] via SUBCUTANEOUS
  Filled 2023-07-24 (×2): qty 1

## 2023-07-24 MED ORDER — IRBESARTAN 75 MG PO TABS
75.0000 mg | ORAL_TABLET | Freq: Every day | ORAL | Status: DC
  Administered 2023-07-24 – 2023-07-25 (×2): 75 mg via ORAL
  Filled 2023-07-24 (×2): qty 1

## 2023-07-24 MED ORDER — ASPIRIN 81 MG PO CHEW: 81.0000 mg | CHEWABLE_TABLET | Freq: Every day | ORAL | Status: DC

## 2023-07-24 MED ORDER — ASPIRIN 300 MG RE SUPP
300.0000 mg | RECTAL | Status: AC
  Filled 2023-07-24: qty 1

## 2023-07-24 MED ORDER — PANTOPRAZOLE SODIUM 40 MG PO TBEC
40.0000 mg | DELAYED_RELEASE_TABLET | Freq: Two times a day (BID) | ORAL | Status: DC
  Administered 2023-07-24 – 2023-07-25 (×2): 40 mg via ORAL
  Filled 2023-07-24 (×2): qty 1

## 2023-07-24 MED ORDER — ATORVASTATIN CALCIUM 80 MG PO TABS
80.0000 mg | ORAL_TABLET | Freq: Every day | ORAL | Status: DC
  Administered 2023-07-24: 80 mg via ORAL
  Filled 2023-07-24: qty 1

## 2023-07-24 MED ORDER — SPIRONOLACTONE 12.5 MG HALF TABLET
12.5000 mg | ORAL_TABLET | Freq: Every day | ORAL | Status: DC
  Administered 2023-07-24 – 2023-07-25 (×2): 12.5 mg via ORAL
  Filled 2023-07-24 (×2): qty 1

## 2023-07-24 MED ORDER — INSULIN ASPART 100 UNIT/ML IJ SOLN
0.0000 [IU] | Freq: Three times a day (TID) | INTRAMUSCULAR | Status: DC
  Administered 2023-07-25 (×2): 5 [IU] via SUBCUTANEOUS

## 2023-07-24 MED ORDER — METOPROLOL TARTRATE 25 MG PO TABS
25.0000 mg | ORAL_TABLET | Freq: Two times a day (BID) | ORAL | Status: DC
  Administered 2023-07-24 – 2023-07-25 (×2): 25 mg via ORAL
  Filled 2023-07-24 (×2): qty 1

## 2023-07-24 MED ORDER — ASPIRIN 81 MG PO TBEC
81.0000 mg | DELAYED_RELEASE_TABLET | Freq: Every day | ORAL | Status: DC
  Administered 2023-07-25: 81 mg via ORAL
  Filled 2023-07-24: qty 1

## 2023-07-24 MED ORDER — SOTALOL HCL 80 MG PO TABS
160.0000 mg | ORAL_TABLET | Freq: Two times a day (BID) | ORAL | Status: DC
  Administered 2023-07-24 – 2023-07-25 (×2): 160 mg via ORAL
  Filled 2023-07-24 (×3): qty 2

## 2023-07-24 MED ORDER — ACETAMINOPHEN 325 MG PO TABS: 650.0000 mg | ORAL_TABLET | ORAL | Status: DC | PRN

## 2023-07-24 MED ORDER — EMPAGLIFLOZIN 25 MG PO TABS
25.0000 mg | ORAL_TABLET | Freq: Every day | ORAL | Status: DC
  Administered 2023-07-24 – 2023-07-25 (×2): 25 mg via ORAL
  Filled 2023-07-24 (×2): qty 1

## 2023-07-24 MED ORDER — ONDANSETRON HCL 4 MG/2ML IJ SOLN: 4.0000 mg | Freq: Four times a day (QID) | INTRAMUSCULAR | Status: DC | PRN

## 2023-07-24 MED ORDER — ASPIRIN 81 MG PO CHEW
324.0000 mg | CHEWABLE_TABLET | ORAL | Status: AC
  Administered 2023-07-24: 324 mg via ORAL
  Filled 2023-07-24 (×2): qty 4

## 2023-07-24 NOTE — ED Triage Notes (Signed)
Reports has been shocked by defib 3 times since 9pm last night.  Hx CHF reports the battery niught be going out.

## 2023-07-24 NOTE — ED Provider Notes (Signed)
[facility] AT [facility] Provider Note   CSN: [id number] Arrival date & time: 07/24/23  1031     History  Chief Complaint  Patient presents with   AICD Problem    Cranford Blessinger is a 53 y.o. male.  Patient is a 53 year old male with a history of dilated ischemic cardiomyopathy with a Environmental manager defibrillator, hypertension, CHF, stroke, prior MI and diabetes who is presenting today due to concern that his defibrillator fired.  He reports yesterday was a normal day he has been compliant with his medications.  After waking up this morning he was getting ready to take a shower and started noticing his heart felt like it was racing.  He reports sitting on the couch and felt like he was swaying a little bit while his heart was racing but then it seemed to get better so he went and took a shower but reports as he had come out of the shower and was getting ready to put lotion on he suddenly had a jolt in his chest that felt like he was kicked by a [place].  Since that time he has had some tingling in his fingers bilaterally but denies any further palpitations.  He denied any chest pain or shortness of breath prior to or after the event except some soreness from the defibrillator fire.  He has not had lower extremity swelling denies any drug or alcohol use and reports even stopped using cigarettes.  He was going to take his medication this morning but this happened before he could take them.  The history is provided by the patient and medical records.       Home Medications Prior to Admission medications   Medication Sig Start Date End Date Taking? Authorizing Provider  aspirin EC 81 MG tablet Take 81 mg by mouth daily. Swallow whole.    [provider]  atorvastatin (LIPITOR) 80 MG tablet Take 80 mg by mouth daily.    [provider]  Continuous Glucose Receiver (FREESTYLE LIBRE 3 READER) DEVI UAD to monitor blood sugar 07/12/23   [name], MD  Continuous Glucose Sensor (FREESTYLE LIBRE 3 PLUS SENSOR) MISC Change sensor every 15 days. 07/12/23   [name], MD  empagliflozin (JARDIANCE) 25 MG TABS tablet Take 25 mg by mouth daily.    [provider]  Fingerstix Lancets MISC Use as directed with testing supplies up to 4 times daily. 06/28/23   [name], [name], PA-C  glucose blood test strip Use as instructed 06/28/23   [name], [name], PA-C  insulin glargine (LANTUS) 100 unit/mL SOPN Inject 15 Units into the skin at bedtime. 07/12/23   [name], MD  Lancet Devices (TRUEDRAW LANCING DEVICE) MISC Use as directed up to four times daily 06/28/23   [name], [name], PA-C  nicotine (NICODERM CQ - DOSED IN MG/24 HOURS) 21 mg/24hr patch Place 1 patch (21 mg total) onto the skin daily. 07/12/23   [name], MD  nitroGLYCERIN (NITROSTAT) 0.4 MG SL tablet Place 0.4 mg under the tongue every 5 (five) minutes as needed for chest pain. Patient not taking: Reported on 07/12/2023    [provider]  oxyCODONE (OXY IR/ROXICODONE) 5 MG immediate release tablet Take 1 tablet (5 mg total) by mouth every 6 (six) hours as needed for breakthrough pain. 06/28/23   [name], [name], PA-C  oxyCODONE-acetaminophen (PERCOCET) 10-325 MG tablet Take 1 tablet by mouth every 4 (four) hours as needed for pain.  [provider]  sotalol (BETAPACE) 160 MG tablet Take 160 mg by mouth 2 (two) times daily. Patient not taking: Reported on 07/12/2023    [provider]  torsemide (DEMADEX) 20 MG tablet Take 20 mg by mouth daily as needed (swelling).    [provider]  valsartan (DIOVAN) 80 MG tablet Take 1 tablet (80 mg total) by mouth daily. 06/28/23   [name], [name], PA-C      Allergies    Victoza [liraglutide]    Review of Systems   Review of Systems  Physical Exam Updated Vital Signs BP 122/77 (BP Location: Right Arm)   Pulse 80   Temp 97.8 F (36.6 C)   Resp 16   Ht 5\' 9"  (1.753  m)   Wt 103.9 kg   SpO2 100%   BMI 33.82 kg/m  Physical Exam Vitals and nursing note reviewed.  Constitutional:      General: He is not in acute distress.    Appearance: He is well-developed.  HENT:     Head: Normocephalic and atraumatic.  Eyes:     Conjunctiva/sclera: Conjunctivae normal.     Pupils: Pupils are equal, round, and reactive to light.  Cardiovascular:     Rate and Rhythm: Normal rate and regular rhythm.     Pulses: Normal pulses.     Heart sounds: No murmur heard. Pulmonary:     Effort: Pulmonary effort is normal. No respiratory distress.     Breath sounds: Normal breath sounds. No wheezing or rales.  Abdominal:     General: There is no distension.     Palpations: Abdomen is soft.     Tenderness: There is no abdominal tenderness. There is no guarding or rebound.  Musculoskeletal:        General: No tenderness. Normal range of motion.     Cervical back: Normal range of motion and neck supple.  Skin:    General: Skin is warm and dry.     Findings: No erythema or rash.  Neurological:     Mental Status: He is alert and oriented to person, place, and time. Mental status is at baseline.  Psychiatric:        Behavior: Behavior normal.     ED Results / Procedures / Treatments   Labs (all labs ordered are listed, but only abnormal results are displayed) Labs Reviewed  BASIC METABOLIC PANEL - Abnormal; Notable for the following components:      Result Value   Glucose, Bld 222 (*)    Calcium 8.7 (*)    All other components within normal limits  CBC - Abnormal; Notable for the following components:   Platelets 89 (*)    All other components within normal limits  TROPONIN I (HIGH SENSITIVITY) - Abnormal; Notable for the following components:   Troponin I (High Sensitivity) 40 (*)    All other components within normal limits  TROPONIN I (HIGH SENSITIVITY)    EKG EKG Interpretation Date/Time:  Saturday July 24 2023 10:37:43 EST Ventricular Rate:  82 PR  Interval:  142 QRS Duration:  114 QT Interval:  362 QTC Calculation: 422 R Axis:   41  Text Interpretation: Normal sinus rhythm T wave abnormality, consider inferior ischemia No significant change since last tracing When compared with ECG of 01-Jul-2023 13:02, PREVIOUS ECG IS PRESENT Confirmed by Gwyneth Sprout ([id number]) on 07/24/2023 11:13:20 AM  Radiology DG Chest 2 View Result Date: 07/24/2023 CLINICAL DATA:  Defibrillator firing. EXAM: CHEST - 2 VIEW COMPARISON:  Two-view  chest x-ray 07/01/2023. FINDINGS: AICD entering via left subclavian approach is stable. Heart size is normal. Mild atelectasis is again seen at the left base. The lungs are otherwise clear. The visualized soft tissues and bony thorax are unremarkable. IMPRESSION: 1. Mild left basilar atelectasis. 2. No acute cardiopulmonary disease. Electronically Signed   By: Marin Roberts M.D.   On: 07/24/2023 11:46   VAS [place] LOWER EXTREMITY ARTERIAL DUPLEX Result Date: 07/23/2023 LOWER EXTREMITY ARTERIAL DUPLEX STUDY Patient Name:  MONTY SPICHER  Date of Exam:   07/23/2023 Medical Rec #: [id number]        Accession #:    [id number] Date of Birth: 03/25/1971        Patient Gender: M Patient Age:   66 years Exam Location:  [facility] Procedure:      VAS [place] LOWER EXTREMITY ARTERIAL DUPLEX Referring Phys: Heath Lark --------------------------------------------------------------------------------  Indications: Claudication, peripheral artery disease, and Patient reports              painful distal thigh pain near incision site. High Risk Factors: Hypertension, coronary artery disease.  Vascular Interventions: BYPASS GRAFT FEMORAL- ABOVE KNEE POPLITEAL ARTERY (Left)                         06/24/23. Current ABI:            R 0.62 L 0.67 Performing Technologist: [place] Ponder RVS  Examination Guidelines: A complete evaluation includes B-mode imaging, spectral Doppler, color Doppler, and power Doppler as needed of all  accessible portions of each vessel. Bilateral testing is considered an integral part of a complete examination. Limited examinations for reoccurring indications may be performed as noted.   Left Graft #1: fem-pop +--------------------+--------+--------+--------+--------+                     PSV cm/sStenosisWaveformComments +--------------------+--------+--------+--------+--------+ Inflow              160             biphasic         +--------------------+--------+--------+--------+--------+ Proximal Anastomosis152             biphasic         +--------------------+--------+--------+--------+--------+ Proximal Graft      68              biphasic         +--------------------+--------+--------+--------+--------+ Mid Graft           48              biphasic         +--------------------+--------+--------+--------+--------+ Distal Graft        48              biphasic         +--------------------+--------+--------+--------+--------+ Distal Anastomosis  87              biphasic         +--------------------+--------+--------+--------+--------+ Outflow             48              biphasic         +--------------------+--------+--------+--------+--------+   Summary: Left: Patent femoral- above knee popliteal bypass graft. anechoic structure noted in the distal thigh near incision site measuring 4.18 cm x 3.27cm.  See table(s) above for measurements and observations. Electronically signed by Carolynn Sayers on 07/23/2023 at 11:55:25 AM.    Final  VAS [place] ABI WITH/WO TBI Result Date: 07/23/2023  LOWER EXTREMITY DOPPLER STUDY Patient Name:  KYUSS HALE  Date of Exam:   07/23/2023 Medical Rec #: [id number]        Accession #:    [id number] Date of Birth: 12/17/1970        Patient Gender: M Patient Age:   62 years Exam Location:  [facility] Procedure:      VAS [place] ABI WITH/WO TBI Referring Phys: Heath Lark  --------------------------------------------------------------------------------  Indications: Claudication, and peripheral artery disease. High Risk Factors: Hypertension, Diabetes, coronary artery disease.  Vascular Interventions: BYPASS GRAFT FEMORAL- ABOVE KNEE POPLITEAL ARTERY (Left)                         06/24/23. Comparison Study: 05/23/23 Performing Technologist: [place] Ponder RVS  Examination Guidelines: A complete evaluation includes at minimum, Doppler waveform signals and systolic blood pressure reading at the level of bilateral brachial, anterior tibial, and posterior tibial arteries, when vessel segments are accessible. Bilateral testing is considered an integral part of a complete examination. Photoelectric Plethysmograph (PPG) waveforms and toe systolic pressure readings are included as required and additional duplex testing as needed. Limited examinations for reoccurring indications may be performed as noted.  ABI Findings: +---------+------------------+-----+----------+--------+ Right    Rt Pressure (mmHg)IndexWaveform  Comment  +---------+------------------+-----+----------+--------+ Brachial 129                                       +---------+------------------+-----+----------+--------+ PTA      77                0.60 monophasic         +---------+------------------+-----+----------+--------+ DP       80                0.62 monophasic         +---------+------------------+-----+----------+--------+ Great Toe59                0.46                    +---------+------------------+-----+----------+--------+ +---------+------------------+-----+----------+-------+ Left     Lt Pressure (mmHg)IndexWaveform  Comment +---------+------------------+-----+----------+-------+ Brachial 125                                      +---------+------------------+-----+----------+-------+ PTA      75                0.58 monophasic         +---------+------------------+-----+----------+-------+ DP       87                0.67 monophasic        +---------+------------------+-----+----------+-------+ Great Toe72                0.56                   +---------+------------------+-----+----------+-------+ +-------+-----------+-----------+------------+------------+ ABI/TBIToday's ABIToday's TBIPrevious ABIPrevious TBI +-------+-----------+-----------+------------+------------+ Right  0.62       0.46       0.75        0.41         +-------+-----------+-----------+------------+------------+ Left   0.67       0.56       0.76  0.42         +-------+-----------+-----------+------------+------------+ Bilateral ABIs appear essentially unchanged compared to prior study on 05/26/23.  Summary: Right: Resting right ankle-brachial index indicates moderate right lower extremity arterial disease. The right toe-brachial index is abnormal. Left: Resting left ankle-brachial index indicates moderate left lower extremity arterial disease. The left toe-brachial index is abnormal. *See table(s) above for measurements and observations.  Electronically signed by Carolynn Sayers on 07/23/2023 at 11:54:32 AM.    Final     Procedures Procedures    Medications Ordered in ED Medications - No data to display  ED Course/ Medical Decision Making/ A&P                                 Medical Decision Making Amount and/or Complexity of Data Reviewed External Data Reviewed: notes. Labs: ordered. Decision-making details documented in ED Course. Radiology: ordered and independent interpretation performed. Decision-making details documented in ED Course. ECG/medicine tests: ordered and independent interpretation performed. Decision-making details documented in ED Course.  Risk Decision regarding hospitalization.   Pt with multiple medical problems and comorbidities and presenting today with a complaint that caries a high risk for  morbidity and mortality.  Here today after reporting his defibrillator fired.  Currently patient is in a sinus rhythm with no acute findings on exam.  Pulses are intact in all 4 extremities.  I independently interpreted patient's EKG and labs.  EKG shows no significant changes to prior EKGs.  CBC, BMP without acute findings.  Patient has had persistence of low platelets currently 89.  Trop was elevated today at 40 most likely from shock.  Interrogation is pending.  2:33 PM Interrogation came back and it appears that patient was shocked out of V. tach based on the interrogation.  Will consult cardiology who is coming to see the patient.        Final Clinical Impression(s) / ED Diagnoses Final diagnoses:  V-tach [facility])  AICD discharge    Rx / DC Orders ED Discharge Orders     None         Gwyneth Sprout, MD 07/24/23 1434

## 2023-07-24 NOTE — Medical Student Note (Incomplete)
[facility]-EMERGENCY DEPT Provider Student Note For educational purposes for Medical, PA and NP students only and not part of the legal medical record.   CSN: [id number] Arrival date & time: 07/24/23  1031  Heart was racing prior to getting into the shower. Was walking around after taking the shower, and the defibrillator went off once or twice. Said afterwards felt like a [place] hit him in the chest. Hands are still numb and tingly. No chest pain, N/V, SOB.   Defibrillator placed 10 years ago. [employer] defibrillator. Thinks the battery is due to be changed.   Did not take his medications today, but usually does.   Had left leg surgery on christmas eve    History   Chief Complaint Chief Complaint  Patient presents with   AICD Problem    HPI Michael Barnett is a 53 y.o. male.  HPI  Past Medical History:  Diagnosis Date   AICD (automatic cardioverter/defibrillator) present    Arthritis    CHF (congestive heart failure) (HCC)    Coronary artery disease    Defibrillator activation    Diabetes mellitus    Hypertension    Myocardial infarction [facility])    Sleep apnea    Stroke [facility])     Patient Active Problem List   Diagnosis Date Noted   PAD (peripheral artery disease) (HCC) 06/24/2023    Past Surgical History:  Procedure Laterality Date   ABDOMINAL AORTOGRAM W/LOWER EXTREMITY N/A 06/04/2023   Procedure: ABDOMINAL AORTOGRAM W/LOWER EXTREMITY;  Surgeon: Leonie Douglas, MD;  Location: [facility] INVASIVE CV LAB;  Service: Cardiovascular;  Laterality: N/A;   CORONARY ARTERY BYPASS GRAFT     FEMORAL-POPLITEAL BYPASS GRAFT Left 06/24/2023   Procedure: BYPASS GRAFT FEMORAL- ABOVE KNEE POPLITEAL ARTERY;  Surgeon: Leonie Douglas, MD;  Location: [facility] OR;  Service: Vascular;  Laterality: Left;   RIGHT/LEFT HEART CATH AND CORONARY/GRAFT ANGIOGRAPHY N/A 06/15/2023   Procedure: RIGHT/LEFT HEART CATH AND CORONARY/GRAFT ANGIOGRAPHY;  Surgeon: Tonny Bollman, MD;  Location: [facility] INVASIVE CV  LAB;  Service: Cardiovascular;  Laterality: N/A;       Home Medications    Prior to Admission medications   Medication Sig Start Date End Date Taking? Authorizing Provider  aspirin EC 81 MG tablet Take 81 mg by mouth daily. Swallow whole.    [provider]  atorvastatin (LIPITOR) 80 MG tablet Take 80 mg by mouth daily.    [provider]  Continuous Glucose Receiver (FREESTYLE LIBRE 3 READER) DEVI UAD to monitor blood sugar 07/12/23   [name], MD  Continuous Glucose Sensor (FREESTYLE LIBRE 3 PLUS SENSOR) MISC Change sensor every 15 days. 07/12/23   [name], MD  empagliflozin (JARDIANCE) 25 MG TABS tablet Take 25 mg by mouth daily.    [provider]  Fingerstix Lancets MISC Use as directed with testing supplies up to 4 times daily. 06/28/23   [name], [name], PA-C  glucose blood test strip Use as instructed 06/28/23   [name], [name], PA-C  insulin glargine (LANTUS) 100 unit/mL SOPN Inject 15 Units into the skin at bedtime. 07/12/23   [name], MD  Lancet Devices (TRUEDRAW LANCING DEVICE) MISC Use as directed up to four times daily 06/28/23   [name], [name], PA-C  nicotine (NICODERM CQ - DOSED IN MG/24 HOURS) 21 mg/24hr patch Place 1 patch (21 mg total) onto the skin daily. 07/12/23   [name], MD  nitroGLYCERIN (NITROSTAT) 0.4 MG SL tablet Place 0.4 mg under the tongue  every 5 (five) minutes as needed for chest pain. Patient not taking: Reported on 07/12/2023    [provider]  oxyCODONE (OXY IR/ROXICODONE) 5 MG immediate release tablet Take 1 tablet (5 mg total) by mouth every 6 (six) hours as needed for breakthrough pain. 06/28/23   [name], [name], PA-C  oxyCODONE-acetaminophen (PERCOCET) 10-325 MG tablet Take 1 tablet by mouth every 4 (four) hours as needed for pain.    [provider]  sotalol (BETAPACE) 160 MG tablet Take 160 mg by mouth 2 (two) times daily. Patient not taking: Reported on 07/12/2023     [provider]  torsemide (DEMADEX) 20 MG tablet Take 20 mg by mouth daily as needed (swelling).    [provider]  valsartan (DIOVAN) 80 MG tablet Take 1 tablet (80 mg total) by mouth daily. 06/28/23   [name], PA-C    Family History Family History  Problem Relation Age of Onset   Heart attack Mother    Heart attack Father    Heart attack Maternal Uncle     Social History Social History   Tobacco Use   Smoking status: Every Day    Current packs/day: 0.50    Average packs/day: 0.5 packs/day for 35.1 years (17.5 ttl pk-yrs)    Types: Cigarettes    Start date: 67  Vaping Use   Vaping status: Never Used  Substance Use Topics   Alcohol use: No   Drug use: Never     Allergies   Victoza [liraglutide]   Review of Systems Review of Systems   Physical Exam Updated Vital Signs BP 122/77 (BP Location: Right Arm)   Pulse 80   Temp 97.8 F (36.6 C)   Resp 16   Ht 5\' 9"  (1.753 m)   Wt 103.9 kg   SpO2 100%   BMI 33.82 kg/m   Physical Exam Ext: Left lower extremity edema  ED Treatments / Results  Labs (all labs ordered are listed, but only abnormal results are displayed) Labs Reviewed  CBC - Abnormal; Notable for the following components:      Result Value   Platelets 89 (*)    All other components within normal limits  BASIC METABOLIC PANEL  TROPONIN I (HIGH SENSITIVITY)    EKG  Radiology VAS [place] LOWER EXTREMITY ARTERIAL DUPLEX Result Date: 07/23/2023 LOWER EXTREMITY ARTERIAL DUPLEX STUDY Patient Name:  Michael Barnett  Date of Exam:   07/23/2023 Medical Rec #: [id number]        Accession #:    [id number] Date of Birth: [birth year]        Patient Gender: M Patient Age:   57 years Exam Location:  [facility] Procedure:      VAS [place] LOWER EXTREMITY ARTERIAL DUPLEX Referring Phys: Heath Lark --------------------------------------------------------------------------------  Indications: Claudication, peripheral artery  disease, and Patient reports              painful distal thigh pain near incision site. High Risk Factors: Hypertension, coronary artery disease.  Vascular Interventions: BYPASS GRAFT FEMORAL- ABOVE KNEE POPLITEAL ARTERY (Left)                         06/24/23. Current ABI:            R 0.62 L 0.67 Performing Technologist: [place] Ponder RVS  Examination Guidelines: A complete evaluation includes B-mode imaging, spectral Doppler, color Doppler, and power Doppler as needed of all accessible portions of each vessel. Bilateral testing is  considered an integral part of a complete examination. Limited examinations for reoccurring indications may be performed as noted.   Left Graft #1: fem-pop +--------------------+--------+--------+--------+--------+                     PSV cm/sStenosisWaveformComments +--------------------+--------+--------+--------+--------+ Inflow              160             biphasic         +--------------------+--------+--------+--------+--------+ Proximal Anastomosis152             biphasic         +--------------------+--------+--------+--------+--------+ Proximal Graft      68              biphasic         +--------------------+--------+--------+--------+--------+ Mid Graft           48              biphasic         +--------------------+--------+--------+--------+--------+ Distal Graft        48              biphasic         +--------------------+--------+--------+--------+--------+ Distal Anastomosis  87              biphasic         +--------------------+--------+--------+--------+--------+ Outflow             48              biphasic         +--------------------+--------+--------+--------+--------+   Summary: Left: Patent femoral- above knee popliteal bypass graft. anechoic structure noted in the distal thigh near incision site measuring 4.18 cm x 3.27cm.  See table(s) above for measurements and observations. Electronically signed by Carolynn Sayers on 07/23/2023 at 11:55:25 AM.    Final    VAS [place] ABI WITH/WO TBI Result Date: 07/23/2023  LOWER EXTREMITY DOPPLER STUDY Patient Name:  KEYION KNACK  Date of Exam:   07/23/2023 Medical Rec #: [id number]        Accession #:    [id number] Date of Birth: Feb 27, 1971        Patient Gender: M Patient Age:   64 years Exam Location:  [facility] Procedure:      VAS [place] ABI WITH/WO TBI Referring Phys: Heath Lark --------------------------------------------------------------------------------  Indications: Claudication, and peripheral artery disease. High Risk Factors: Hypertension, Diabetes, coronary artery disease.  Vascular Interventions: BYPASS GRAFT FEMORAL- ABOVE KNEE POPLITEAL ARTERY (Left)                         06/24/23. Comparison Study: 05/23/23 Performing Technologist: [place] Ponder RVS  Examination Guidelines: A complete evaluation includes at minimum, Doppler waveform signals and systolic blood pressure reading at the level of bilateral brachial, anterior tibial, and posterior tibial arteries, when vessel segments are accessible. Bilateral testing is considered an integral part of a complete examination. Photoelectric Plethysmograph (PPG) waveforms and toe systolic pressure readings are included as required and additional duplex testing as needed. Limited examinations for reoccurring indications may be performed as noted.  ABI Findings: +---------+------------------+-----+----------+--------+ Right    Rt Pressure (mmHg)IndexWaveform  Comment  +---------+------------------+-----+----------+--------+ Brachial 129                                       +---------+------------------+-----+----------+--------+ PTA  77                0.60 monophasic         +---------+------------------+-----+----------+--------+ DP       80                0.62 monophasic         +---------+------------------+-----+----------+--------+ Great Toe59                0.46                     +---------+------------------+-----+----------+--------+ +---------+------------------+-----+----------+-------+ Left     Lt Pressure (mmHg)IndexWaveform  Comment +---------+------------------+-----+----------+-------+ Brachial 125                                      +---------+------------------+-----+----------+-------+ PTA      75                0.58 monophasic        +---------+------------------+-----+----------+-------+ DP       87                0.67 monophasic        +---------+------------------+-----+----------+-------+ Great Toe72                0.56                   +---------+------------------+-----+----------+-------+ +-------+-----------+-----------+------------+------------+ ABI/TBIToday's ABIToday's TBIPrevious ABIPrevious TBI +-------+-----------+-----------+------------+------------+ Right  0.62       0.46       0.75        0.41         +-------+-----------+-----------+------------+------------+ Left   0.67       0.56       0.76        0.42         +-------+-----------+-----------+------------+------------+ Bilateral ABIs appear essentially unchanged compared to prior study on 05/26/23.  Summary: Right: Resting right ankle-brachial index indicates moderate right lower extremity arterial disease. The right toe-brachial index is abnormal. Left: Resting left ankle-brachial index indicates moderate left lower extremity arterial disease. The left toe-brachial index is abnormal. *See table(s) above for measurements and observations.  Electronically signed by Carolynn Sayers on 07/23/2023 at 11:54:32 AM.    Final     Procedures Procedures (including critical care time)  Medications Ordered in ED Medications - No data to display   Initial Impression / Assessment and Plan / ED Course  I have reviewed the triage vital signs and the nursing notes.  Pertinent labs & imaging results that were available during my care of the patient  were reviewed by me and considered in my medical decision making (see chart for details).     ***  Final Clinical Impressions(s) / ED Diagnoses   Final diagnoses:  None    New Prescriptions New Prescriptions   No medications on file

## 2023-07-24 NOTE — H&P (Signed)
Cardiology H&P   Patient ID: Jamauri Kruzel MRN: [id number]; DOB: 05-05-1971  Admit date: 07/24/2023 Date of Consult: 07/24/2023  PCP:  [name], MD   [facility] Providers Cardiologist:  Orbie Pyo, MD   {  Patient Profile:   Zacchaeus Halm is a 53 y.o. male with a hx of dilated ischemic cardiomyopathy s/p ICD, VT s/p ablation 10/2022, CAD s/p CABG 2013, PCI to RCA 2016, hypertension, CHF, stroke, prior MI, OSA and diabetes who is being seen 07/24/2023 for the evaluation of defibrillator activation from episode of VT at the request of Dr. Anitra Lauth.  History of Present Illness:   Mr. Tedesco has a past medical history of dilated ischemic cardiomyopathy s/p ICD, VT s/p ablation 10/2022, CAD s/p CABG 2013, PCI to RCA 2016, hypertension, CHF, stroke, prior MI, OSA and diabetes who presented to the [facility] ED on 07/24/2023 after reporting his ICD fired from an episode of VT (interrogation appears to be potentially atrial fibrillation instead of VT).   Per chart review, it appears that patient was getting most of cardiology care through [facility]. His last office visit was 04/02/2023 for a hospital follow up, after being admitted for chest pain, hiccups, mild troponin elevation (determined to be demand mediated). At this time his treatment plan was as follows: sotalol 160 mg BID, Jardiance 25  mg daily, spironolactone 12.5 mg daily,  PRN torsemide 20 mg, valsartan 80 mg daily, ASA 81 mg daily, atorvastatin 80 mg daily.  He was seen by Dr. Lynnette Caffey on 06/09/2023 for cardiac clearance prior to vascular surgery.  He underwent RHC/LHC on 06/15/23 which showed: severe native vessel CAD with severe distal left main stenosis, total occlusion of the proximal circumflex, severe proximal LAD stenosis, and severe diffuse RCA stenosis. Status post CABG with chronic occlusion of the SVG to OM and wide patency of the LIMA to LAD. The LIMA graft is large in caliber, widely patent, and  fills a large area of myocardium as it backfills the proximal LAD and its branches, also supplies collaterals to the circumflex. Preserved cardiac output with elevated diastolic filling pressures.  Underwent echocardiogram on 06/15/23 which showed: LVEF 45-50%, LV RWMA, grade III diastolic dysfunction, LV inferior wall akinetic, posterior wall hypokinetic, normal RV size and function, moderately dilated left atrium, mildly dilated right atrium, mild MR  Patient then underwent underwent left common femoral to above knee popliteal bypass on 06/24/2023 by Dr. Lenell Antu for PAD.  Patient was last seen by Dr. Laural Benes for hospital follow up. He was doing well post-op. At this time it was noted that patient was taking ASA 81 mg daily, Lipitor 80 mg daily, valsartan 80 mg daily, Jardiance 25 mg daily, PRN torsemide 20 mg for swelling (patient reported hypokalemia with scheduled torsemide). It was unclear if he was still supposed to be taking his sotalol.   Past Medical History:  Diagnosis Date   AICD (automatic cardioverter/defibrillator) present    Arthritis    CHF (congestive heart failure) (HCC)    Coronary artery disease    Defibrillator activation    Diabetes mellitus    Hypertension    Myocardial infarction [facility])    Sleep apnea    Stroke [facility])    Past Surgical History:  Procedure Laterality Date   ABDOMINAL AORTOGRAM W/LOWER EXTREMITY N/A 06/04/2023   Procedure: ABDOMINAL AORTOGRAM W/LOWER EXTREMITY;  Surgeon: Leonie Douglas, MD;  Location: [facility] INVASIVE CV LAB;  Service: Cardiovascular;  Laterality: N/A;   CORONARY ARTERY BYPASS GRAFT  FEMORAL-POPLITEAL BYPASS GRAFT Left 06/24/2023   Procedure: BYPASS GRAFT FEMORAL- ABOVE KNEE POPLITEAL ARTERY;  Surgeon: Leonie Douglas, MD;  Location: [facility] OR;  Service: Vascular;  Laterality: Left;   RIGHT/LEFT HEART CATH AND CORONARY/GRAFT ANGIOGRAPHY N/A 06/15/2023   Procedure: RIGHT/LEFT HEART CATH AND CORONARY/GRAFT ANGIOGRAPHY;  Surgeon: Tonny Bollman, MD;  Location: [facility] INVASIVE CV LAB;  Service: Cardiovascular;  Laterality: N/A;    Home Medications:  Prior to Admission medications   Medication Sig Start Date End Date Taking? Authorizing Provider  aspirin EC 81 MG tablet Take 81 mg by mouth daily. Swallow whole.    [provider]  atorvastatin (LIPITOR) 80 MG tablet Take 80 mg by mouth daily.    [provider]  Continuous Glucose Receiver (FREESTYLE LIBRE 3 READER) DEVI UAD to monitor blood sugar 07/12/23   [name], MD  Continuous Glucose Sensor (FREESTYLE LIBRE 3 PLUS SENSOR) MISC Change sensor every 15 days. 07/12/23   [name], MD  empagliflozin (JARDIANCE) 25 MG TABS tablet Take 25 mg by mouth daily.    [provider]  Fingerstix Lancets MISC Use as directed with testing supplies up to 4 times daily. 06/28/23   [name], [name], PA-C  glucose blood test strip Use as instructed 06/28/23   [name], [name], PA-C  insulin glargine (LANTUS) 100 unit/mL SOPN Inject 15 Units into the skin at bedtime. 07/12/23   [name], MD  Lancet Devices (TRUEDRAW LANCING DEVICE) MISC Use as directed up to four times daily 06/28/23   [name], [name], PA-C  nicotine (NICODERM CQ - DOSED IN MG/24 HOURS) 21 mg/24hr patch Place 1 patch (21 mg total) onto the skin daily. 07/12/23   [name], MD  nitroGLYCERIN (NITROSTAT) 0.4 MG SL tablet Place 0.4 mg under the tongue every 5 (five) minutes as needed for chest pain. Patient not taking: Reported on 07/12/2023    [provider]  oxyCODONE (OXY IR/ROXICODONE) 5 MG immediate release tablet Take 1 tablet (5 mg total) by mouth every 6 (six) hours as needed for breakthrough pain. 06/28/23   [name], [name], PA-C  oxyCODONE-acetaminophen (PERCOCET) 10-325 MG tablet Take 1 tablet by mouth every 4 (four) hours as needed for pain.    [provider]  sotalol (BETAPACE) 160 MG tablet Take 160 mg by mouth 2 (two) times daily. Patient not  taking: Reported on 07/12/2023    [provider]  torsemide (DEMADEX) 20 MG tablet Take 20 mg by mouth daily as needed (swelling).    [provider]  valsartan (DIOVAN) 80 MG tablet Take 1 tablet (80 mg total) by mouth daily. 06/28/23   [name], PA-C   Inpatient Medications: Scheduled Meds:  Continuous Infusions:  PRN Meds:  Allergies:    Allergies  Allergen Reactions   Victoza [Liraglutide] Other (See Comments)    Gave pt pancreatitis   Social History:   Social History   Socioeconomic History   Marital status: Single    Spouse name: Not on file   Number of children: Not on file   Years of education: Not on file   Highest education level: Not on file  Occupational History   Not on file  Tobacco Use   Smoking status: Every Day    Current packs/day: 0.50    Average packs/day: 0.5 packs/day for 35.1 years (17.5 ttl pk-yrs)    Types: Cigarettes    Start date: 31   Smokeless tobacco: Not on file  Vaping Use   Vaping  status: Never Used  Substance and Sexual Activity   Alcohol use: No   Drug use: Never   Sexual activity: Yes  Other Topics Concern   Not on file  Social History Narrative   Not on file   Social Drivers of Health   Financial Resource Strain: Low Risk  (03/25/2023)   Received from [facility] [employer] Strain (CARDIA)    Difficulty of Paying Living Expenses: Not hard at all  Food Insecurity: No Food Insecurity (06/24/2023)   Hunger Vital Sign    Worried About Running Out of Food in the Last Year: Never true    Ran Out of Food in the Last Year: Never true  Transportation Needs: No Transportation Needs (06/24/2023)   PRAPARE - Administrator, Civil Service (Medical): No    Lack of Transportation (Non-Medical): No  Physical Activity: Not on file  Stress: Not on file  Social Connections: Not on file  Intimate Partner Violence: Not At Risk (06/24/2023)   Humiliation, Afraid,  Rape, and Kick questionnaire    Fear of Current or Ex-Partner: No    Emotionally Abused: No    Physically Abused: No    Sexually Abused: No    Family History:   Family History  Problem Relation Age of Onset   Heart attack Mother    Heart attack Father    Heart attack Maternal Uncle     ROS:  Please see the history of present illness.  All other ROS reviewed and negative.     Physical Exam/Data:   Vitals:   07/24/23 1043 07/24/23 1415 07/24/23 1500 07/24/23 1541  BP:  (!) 110/98 138/80 (!) 122/93  Pulse:   81   Resp:  11 14 19   Temp:   98 F (36.7 C)   TempSrc:   Oral   SpO2:   99%   Weight: 103.9 kg     Height: 5\' 9"  (1.753 m)      No intake or output data in the 24 hours ending 07/24/23 1607    07/24/2023   10:43 AM 07/12/2023    2:45 PM 06/24/2023    9:53 AM  Last 3 Weights  Weight (lbs) 229 lb 229 lb 222 lb 14.2 oz  Weight (kg) 103.874 kg 103.874 kg 101.1 kg     Body mass index is 33.82 kg/m.  General:  Well nourished, well developed, in no acute distress HEENT: normal Neck: no JVD Vascular: Distal pulses 2+ bilaterally Cardiac:  normal S1, S2; RRR; no murmur  Lungs:  clear to auscultation bilaterally, no wheezing, rhonchi or rales  Abd: soft, non-tender Ext: no edema Musculoskeletal:  No deformities Skin: warm and dry  Neuro: no focal abnormalities noted Psych:  Normal affect   EKG:  The EKG was personally reviewed and demonstrates:  sinus rhythm HR 82 bpm  Relevant CV Studies: Echocardiogram 06/15/2023 IMPRESSIONS   1. Left ventricular ejection fraction, by estimation, is 45 to 50%. The  left ventricle has mildly decreased function. The left ventricle  demonstrates regional wall motion abnormalities (see scoring  diagram/findings for description). Left ventricular  diastolic parameters are consistent with Grade III diastolic dysfunction  (restrictive).   2. Right ventricular systolic function is normal. The right ventricular  size is normal.    3. Left atrial size was moderately dilated.   4. Right atrial size was mildly dilated.   5. The mitral valve is normal in structure. Mild mitral valve  regurgitation. No  evidence of mitral stenosis.   6. The aortic valve is tricuspid. There is mild calcification of the  aortic valve. Aortic valve regurgitation is not visualized. Aortic valve  sclerosis/calcification is present, without any evidence of aortic  stenosis.   7. The inferior vena cava is normal in size with greater than 50%  respiratory variability, suggesting right atrial pressure of 3 mmHg.   FINDINGS   Left Ventricle: Left ventricular ejection fraction, by estimation, is 45  to 50%. The left ventricle has mildly decreased function. The left  ventricle demonstrates regional wall motion abnormalities. The left  ventricular internal cavity size was normal  in size. There is no left ventricular hypertrophy. Left ventricular  diastolic parameters are consistent with Grade III diastolic dysfunction  (restrictive).    LV Wall Scoring:  The inferior wall is akinetic. The posterior wall is hypokinetic.   Right Ventricle: The right ventricular size is normal. No increase in  right ventricular wall thickness. Right ventricular systolic function is  normal.   Left Atrium: Left atrial size was moderately dilated.   Right Atrium: Right atrial size was mildly dilated.   Pericardium: There is no evidence of pericardial effusion.   Mitral Valve: The mitral valve is normal in structure. Mild mitral annular  calcification. Mild mitral valve regurgitation. No evidence of mitral  valve stenosis.   Tricuspid Valve: The tricuspid valve is not well visualized. Tricuspid  valve regurgitation is not demonstrated. No evidence of tricuspid  stenosis.   Aortic Valve: The aortic valve is tricuspid. There is mild calcification  of the aortic valve. Aortic valve regurgitation is not visualized. Aortic  valve sclerosis/calcification is  present, without any evidence of aortic  stenosis.   Pulmonic Valve: The pulmonic valve was not well visualized. Pulmonic valve  regurgitation is not visualized. No evidence of pulmonic stenosis.   Aorta: The aortic root was not well visualized.   Venous: The inferior vena cava is normal in size with greater than 50%  respiratory variability, suggesting right atrial pressure of 3 mmHg.   IAS/Shunts: No atrial level shunt detected by color flow Doppler.   Additional Comments: A device lead is visualized.   RHC/LHC 06/15/2023 1.  Severe native vessel coronary artery disease with severe distal left main stenosis, total occlusion of the proximal circumflex, severe proximal LAD stenosis, and severe diffuse RCA stenosis 2.  Status post CABG with chronic occlusion of the SVG to OM and wide patency of the LIMA to LAD 3.  The left circumflex branches fill late from left to left collaterals 4.  The LIMA graft is large in caliber, widely patent, and fills a large area of myocardium as it backfills the proximal LAD and its branches, also supplies collaterals to the circumflex 5.  Preserved cardiac output with elevated diastolic filling pressures Mean PA pressure 37 mmHg Mean wedge pressure 19 mmHg Transpulmonary gradient 18 mmHg with PVR 3 Wood units Cardiac output 5.9 L/min    Patient appears to have stable coronary anatomy based on comparison to previous cath report.    Laboratory Data: High Sensitivity Troponin:   Recent Labs  Lab 07/24/23 1049  TROPONINIHS 40*     Chemistry Recent Labs  Lab 07/24/23 1049  NA 136  K 4.2  CL 101  CO2 24  GLUCOSE 222*  BUN 19  CREATININE 1.23  CALCIUM 8.7*  GFRNONAA >60  ANIONGAP 11    No results for input(s): "PROT", "ALBUMIN", "AST", "ALT", "ALKPHOS", "BILITOT" in the last 168 hours. Lipids  No results for input(s): "CHOL", "TRIG", "HDL", "LABVLDL", "LDLCALC", "CHOLHDL" in the last 168 hours.  Hematology Recent Labs  Lab 07/24/23 1049   WBC 4.6  RBC 5.12  HGB 15.9  HCT 48.3  MCV 94.3  MCH 31.1  MCHC 32.9  RDW 13.7  PLT 89*   Thyroid No results for input(s): "TSH", "FREET4" in the last 168 hours.  BNPNo results for input(s): "BNP", "PROBNP" in the last 168 hours.  DDimer No results for input(s): "DDIMER" in the last 168 hours.  Radiology/Studies:  DG Chest 2 View Result Date: 07/24/2023 CLINICAL DATA:  Defibrillator firing. EXAM: CHEST - 2 VIEW COMPARISON:  Two-view chest x-ray 07/01/2023. FINDINGS: AICD entering via left subclavian approach is stable. Heart size is normal. Mild atelectasis is again seen at the left base. The lungs are otherwise clear. The visualized soft tissues and bony thorax are unremarkable. IMPRESSION: 1. Mild left basilar atelectasis. 2. No acute cardiopulmonary disease. Electronically Signed   By: Marin Roberts M.D.   On: 07/24/2023 11:46   VAS [place] LOWER EXTREMITY ARTERIAL DUPLEX Result Date: 07/23/2023 LOWER EXTREMITY ARTERIAL DUPLEX STUDY Patient Name:  RAMEL TOBON  Date of Exam:   07/23/2023 Medical Rec #: [id number]        Accession #:    [id number] Date of Birth: Aug 16, 1970        Patient Gender: M Patient Age:   18 years Exam Location:  [facility] Procedure:      VAS [place] LOWER EXTREMITY ARTERIAL DUPLEX Referring Phys: Heath Lark --------------------------------------------------------------------------------  Indications: Claudication, peripheral artery disease, and Patient reports              painful distal thigh pain near incision site. High Risk Factors: Hypertension, coronary artery disease.  Vascular Interventions: BYPASS GRAFT FEMORAL- ABOVE KNEE POPLITEAL ARTERY (Left)                         06/24/23. Current ABI:            R 0.62 L 0.67 Performing Technologist: [place] Ponder RVS  Examination Guidelines: A complete evaluation includes B-mode imaging, spectral Doppler, color Doppler, and power Doppler as needed of all accessible portions of each vessel.  Bilateral testing is considered an integral part of a complete examination. Limited examinations for reoccurring indications may be performed as noted.   Left Graft #1: fem-pop +--------------------+--------+--------+--------+--------+                     PSV cm/sStenosisWaveformComments +--------------------+--------+--------+--------+--------+ Inflow              160             biphasic         +--------------------+--------+--------+--------+--------+ Proximal Anastomosis152             biphasic         +--------------------+--------+--------+--------+--------+ Proximal Graft      68              biphasic         +--------------------+--------+--------+--------+--------+ Mid Graft           48              biphasic         +--------------------+--------+--------+--------+--------+ Distal Graft        48              biphasic         +--------------------+--------+--------+--------+--------+ Distal Anastomosis  87              biphasic         +--------------------+--------+--------+--------+--------+ Outflow             48              biphasic         +--------------------+--------+--------+--------+--------+   Summary: Left: Patent femoral- above knee popliteal bypass graft. anechoic structure noted in the distal thigh near incision site measuring 4.18 cm x 3.27cm.  See table(s) above for measurements and observations. Electronically signed by Carolynn Sayers on 07/23/2023 at 11:55:25 AM.    Final    VAS [place] ABI WITH/WO TBI Result Date: 07/23/2023  LOWER EXTREMITY DOPPLER STUDY Patient Name:  PIUS BYROM  Date of Exam:   07/23/2023 Medical Rec #: [id number]        Accession #:    [id number] Date of Birth: 07/05/1970        Patient Gender: M Patient Age:   45 years Exam Location:  [facility] Procedure:      VAS [place] ABI WITH/WO TBI Referring Phys: Heath Lark --------------------------------------------------------------------------------   Indications: Claudication, and peripheral artery disease. High Risk Factors: Hypertension, Diabetes, coronary artery disease.  Vascular Interventions: BYPASS GRAFT FEMORAL- ABOVE KNEE POPLITEAL ARTERY (Left)                         06/24/23. Comparison Study: 05/23/23 Performing Technologist: [place] Ponder RVS  Examination Guidelines: A complete evaluation includes at minimum, Doppler waveform signals and systolic blood pressure reading at the level of bilateral brachial, anterior tibial, and posterior tibial arteries, when vessel segments are accessible. Bilateral testing is considered an integral part of a complete examination. Photoelectric Plethysmograph (PPG) waveforms and toe systolic pressure readings are included as required and additional duplex testing as needed. Limited examinations for reoccurring indications may be performed as noted.  ABI Findings: +---------+------------------+-----+----------+--------+ Right    Rt Pressure (mmHg)IndexWaveform  Comment  +---------+------------------+-----+----------+--------+ Brachial 129                                       +---------+------------------+-----+----------+--------+ PTA      77                0.60 monophasic         +---------+------------------+-----+----------+--------+ DP       80                0.62 monophasic         +---------+------------------+-----+----------+--------+ Great Toe59                0.46                    +---------+------------------+-----+----------+--------+ +---------+------------------+-----+----------+-------+ Left     Lt Pressure (mmHg)IndexWaveform  Comment +---------+------------------+-----+----------+-------+ Brachial 125                                      +---------+------------------+-----+----------+-------+ PTA      75                0.58 monophasic        +---------+------------------+-----+----------+-------+ DP       87                0.67  monophasic         +---------+------------------+-----+----------+-------+ Great Toe72                0.56                   +---------+------------------+-----+----------+-------+ +-------+-----------+-----------+------------+------------+ ABI/TBIToday's ABIToday's TBIPrevious ABIPrevious TBI +-------+-----------+-----------+------------+------------+ Right  0.62       0.46       0.75        0.41         +-------+-----------+-----------+------------+------------+ Left   0.67       0.56       0.76        0.42         +-------+-----------+-----------+------------+------------+ Bilateral ABIs appear essentially unchanged compared to prior study on 05/26/23.  Summary: Right: Resting right ankle-brachial index indicates moderate right lower extremity arterial disease. The right toe-brachial index is abnormal. Left: Resting left ankle-brachial index indicates moderate left lower extremity arterial disease. The left toe-brachial index is abnormal. *See table(s) above for measurements and observations.  Electronically signed by Carolynn Sayers on 07/23/2023 at 11:54:32 AM.    Final    Assessment and Plan:   CAD s/p CABG x 2, ICD  Ischemic cardiomyopathy Echo on 06/15/23 which showed: LVEF 45-50%, LV RWMA, grade III diastolic dysfunction, LV inferior wall akinetic, posterior wall hypokinetic, normal RV size and function, moderately dilated left atrium, mildly dilated right atrium, mild MR RHC/LHC from 06/15/23 showed: severe CAD with left main stenosis, total occlusion of the proximal circumflex, severe proximal LAD stenosis, and severe diffuse RCA stenosis. S/p CABG with chronic occlusion of the SVG to OM and wide patency of the LIMA to LAD, preserved cardiac output with elevated diastolic filling pressures  Continue home ASA 81 mg daily, Jardiance 25 mg daily Patient typically on valsartan 80 mg will start losartan 50 mg daily Start on Lopressor 25 mg BID   Patient does PRN torsemide at home, will monitor  for swelling and give as needed.  History of VT s/p ablation, ICD Episode of VT vs. Atrial fibrillation with aberrancy  Presented to ED after ICD fired this morning Device interrogation showed multiple episodes of VT, longest being 9 min 50 sec with ATP x 2 and 14 J shock x 1 Reviewing tracings look more consistent with possible atrial fibrillation, will review with EP MD Continue home sotalol 160 mg BID  Start on Lopressor 25 mg BID    Hyperlipidemia Lipid panel from 06/25/2023: total 103, HDL 29, LDL 59, trigs 75 Continue home Lipitor 80 mg daily  Hypertension Most recent BP 110/98 Patient typically on valsartan 80 mg will start losartan 50 mg daily Start on Lopressor 25 mg BID    Type 2 Diabetes Mellitus with long-term use of insulin  A1C from 06/24/23 was 9.1%, previously documented > 12% (per [facility]) Patient to be on sliding scale insulin while inpatient   For questions or updates, please contact [facility] Please consult [email or web address] for contact info under   Signed, [name], PA-C  07/24/2023 4:07 PM

## 2023-07-24 NOTE — Progress Notes (Signed)
Received Pt from ED, alert and oriented. No IV access.

## 2023-07-25 DIAGNOSIS — I2510 Atherosclerotic heart disease of native coronary artery without angina pectoris: Secondary | ICD-10-CM | POA: Insufficient documentation

## 2023-07-25 DIAGNOSIS — I255 Ischemic cardiomyopathy: Secondary | ICD-10-CM | POA: Diagnosis not present

## 2023-07-25 DIAGNOSIS — I472 Ventricular tachycardia, unspecified: Secondary | ICD-10-CM | POA: Diagnosis not present

## 2023-07-25 DIAGNOSIS — I480 Paroxysmal atrial fibrillation: Secondary | ICD-10-CM | POA: Insufficient documentation

## 2023-07-25 DIAGNOSIS — E119 Type 2 diabetes mellitus without complications: Secondary | ICD-10-CM

## 2023-07-25 DIAGNOSIS — I10 Essential (primary) hypertension: Secondary | ICD-10-CM | POA: Insufficient documentation

## 2023-07-25 DIAGNOSIS — Z8673 Personal history of transient ischemic attack (TIA), and cerebral infarction without residual deficits: Secondary | ICD-10-CM

## 2023-07-25 DIAGNOSIS — Z7901 Long term (current) use of anticoagulants: Secondary | ICD-10-CM | POA: Diagnosis not present

## 2023-07-25 DIAGNOSIS — I493 Ventricular premature depolarization: Secondary | ICD-10-CM | POA: Insufficient documentation

## 2023-07-25 DIAGNOSIS — Z95810 Presence of automatic (implantable) cardiac defibrillator: Secondary | ICD-10-CM | POA: Insufficient documentation

## 2023-07-25 LAB — BASIC METABOLIC PANEL
Anion gap: 9 (ref 5–15)
BUN: 21 mg/dL — ABNORMAL HIGH (ref 6–20)
CO2: 23 mmol/L (ref 22–32)
Calcium: 8.4 mg/dL — ABNORMAL LOW (ref 8.9–10.3)
Chloride: 106 mmol/L (ref 98–111)
Creatinine, Ser: 0.99 mg/dL (ref 0.61–1.24)
GFR, Estimated: >60 mL/min (ref >60)
Glucose, Bld: 264 mg/dL — ABNORMAL HIGH (ref 70–99)
Potassium: 3.9 mmol/L (ref 3.5–5.1)
Sodium: 138 mmol/L (ref 135–145)

## 2023-07-25 LAB — TSH: TSH: 1.562 u[IU]/mL (ref 0.350–4.500)

## 2023-07-25 LAB — GLUCOSE, CAPILLARY
Glucose-Capillary: 227 mg/dL — ABNORMAL HIGH (ref 70–99)
Glucose-Capillary: 217 mg/dL — ABNORMAL HIGH (ref 70–99)

## 2023-07-25 LAB — MAGNESIUM: Magnesium: 2.1 mg/dL (ref 1.7–2.4)

## 2023-07-25 MED ORDER — METOPROLOL TARTRATE 25 MG PO TABS: 25.0000 mg | ORAL_TABLET | Freq: Two times a day (BID) | ORAL | 3 refills | Status: DC

## 2023-07-25 MED ORDER — APIXABAN 5 MG PO TABS
5.0000 mg | ORAL_TABLET | Freq: Two times a day (BID) | ORAL | 3 refills | Status: AC
  Filled 2023-10-01: qty 180, 90d supply, fill #0

## 2023-07-25 NOTE — Plan of Care (Signed)
  Problem: Education: Goal: Knowledge of General Education information will improve Description: Including pain rating scale, medication(s)/side effects and non-pharmacologic comfort measures Outcome: Progressing   Problem: Health Behavior/Discharge Planning: Goal: Ability to manage health-related needs will improve Outcome: Progressing   Problem: Clinical Measurements: Goal: Ability to maintain clinical measurements within normal limits will improve Outcome: Progressing Goal: Will remain free from infection Outcome: Progressing Goal: Diagnostic test results will improve Outcome: Progressing Goal: Respiratory complications will improve Outcome: Progressing Goal: Cardiovascular complication will be avoided Outcome: Progressing   Problem: Activity: Goal: Risk for activity intolerance will decrease Outcome: Progressing   Problem: Nutrition: Goal: Adequate nutrition will be maintained Outcome: Progressing   Problem: Coping: Goal: Level of anxiety will decrease Outcome: Progressing   Problem: Elimination: Goal: Will not experience complications related to bowel motility Outcome: Progressing Goal: Will not experience complications related to urinary retention Outcome: Progressing   Problem: Pain Managment: Goal: General experience of comfort will improve and/or be controlled Outcome: Progressing   Problem: Safety: Goal: Ability to remain free from injury will improve Outcome: Progressing   Problem: Skin Integrity: Goal: Risk for impaired skin integrity will decrease Outcome: Progressing   Problem: Education: Goal: Ability to describe self-care measures that may prevent or decrease complications (Diabetes Survival Skills Education) will improve Outcome: Progressing Goal: Individualized Educational Video(s) Outcome: Progressing   Problem: Coping: Goal: Ability to adjust to condition or change in health will improve Outcome: Progressing   Problem: Fluid  Volume: Goal: Ability to maintain a balanced intake and output will improve Outcome: Progressing   Problem: Health Behavior/Discharge Planning: Goal: Ability to identify and utilize available resources and services will improve Outcome: Progressing Goal: Ability to manage health-related needs will improve Outcome: Progressing   Problem: Metabolic: Goal: Ability to maintain appropriate glucose levels will improve Outcome: Progressing   Problem: Nutritional: Goal: Maintenance of adequate nutrition will improve Outcome: Progressing   Problem: Skin Integrity: Goal: Risk for impaired skin integrity will decrease Outcome: Progressing   Problem: Tissue Perfusion: Goal: Adequacy of tissue perfusion will improve Outcome: Progressing

## 2023-07-25 NOTE — Progress Notes (Signed)
Patient verbalized understanding of dc instructions. All belongings and paperwork given to patient

## 2023-07-25 NOTE — Discharge Summary (Signed)
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 Left ventricular ejection fraction, by estimation, is 45 to 50%. The  left ventricle has mildly decreased function. The left ventricle  demonstrates regional wall motion abnormalities (see scoring  diagram/findings for description). Left ventricular  diastolic parameters are consistent with Grade III diastolic dysfunction  (restrictive).   2. Right ventricular systolic function is normal. The right ventricular  size is normal.   3. Left atrial size was moderately dilated.   4. Right atrial size was mildly dilated.   5. The mitral valve is normal in structure. Mild mitral valve  regurgitation. No evidence of mitral stenosis.   6. The aortic valve is tricuspid. There is mild calcification of the  aortic valve. Aortic valve regurgitation is not visualized. Aortic valve  sclerosis/calcification is present, without any evidence of aortic  stenosis.   7. The inferior vena cava is normal in size with greater than 50%  respiratory variability, suggesting right atrial pressure of 3 mmHg.   FINDINGS   Left Ventricle: Left ventricular ejection fraction, by estimation, is 45  to 50%. The left ventricle has mildly decreased function. The left  ventricle demonstrates regional wall motion  abnormalities. The left  ventricular internal cavity size was normal  in size. There is no left ventricular hypertrophy. Left ventricular  diastolic parameters are consistent with Grade III diastolic dysfunction  (restrictive).    LV Wall Scoring:  The inferior wall is akinetic. The posterior wall is hypokinetic.   Right Ventricle: The right ventricular size is normal. No increase in  right ventricular wall thickness. Right ventricular systolic function is  normal.   Left Atrium: Left atrial size was moderately dilated.   Right Atrium: Right atrial size was mildly dilated.   Pericardium: There is no evidence of pericardial effusion.   Mitral Valve: The mitral valve is normal in structure. Mild mitral annular  calcification. Mild mitral valve regurgitation. No evidence of mitral  valve stenosis.   Tricuspid Valve: The tricuspid valve is not well visualized. Tricuspid  valve regurgitation is not demonstrated. No evidence of tricuspid  stenosis.   Aortic Valve: The aortic valve is tricuspid. There is mild calcification  of the aortic valve. Aortic valve regurgitation is not visualized. Aortic  valve sclerosis/calcification is present, without any evidence of aortic  stenosis.   Pulmonic Valve: The pulmonic valve was not well visualized. Pulmonic valve  regurgitation is not visualized. No evidence of pulmonic stenosis.   Aorta: The aortic root was not well visualized.   Venous: The inferior vena cava is normal in size with greater than 50%  respiratory variability, suggesting right atrial pressure of 3 mmHg.   IAS/Shunts: No atrial level shunt detected by color flow Doppler.   Additional Comments: A device lead is visualized.   RHC/LHC 06/15/2023 1.  Severe native vessel coronary artery disease with severe distal left main stenosis, total  occlusion of the proximal circumflex, severe proximal LAD stenosis, and severe diffuse RCA stenosis 2.  Status post CABG with chronic  occlusion of the SVG to OM and wide patency of the LIMA to LAD 3.  The left circumflex branches fill late from left to left collaterals 4.  The LIMA graft is large in caliber, widely patent, and fills a large area of myocardium as it backfills the proximal LAD and its branches, also supplies collaterals to the circumflex 5.  Preserved cardiac output with elevated diastolic filling pressures Mean PA pressure 37 mmHg Mean wedge pressure 19 mmHg Transpulmonary gradient 18 mmHg with PVR 3 Wood units Cardiac output 5.9 L/min    Patient appears to have stable coronary anatomy based on comparison to previous cath report.      History of Present Illness     Michael Barnett is a 53 y.o. male with  a past medical history of dilated ischemic cardiomyopathy s/p ICD, VT s/p ablation 10/2022, CAD s/p CABG 2013, PCI to RCA 2016, hypertension, CHF, stroke, prior MI, OSA and diabetes who presented to the [facility] ED on 07/24/2023 after reporting his ICD fired from an episode of VT (interrogation appears to be potentially atrial fibrillation instead of VT).   Per chart review, it appears that patient was getting most of cardiology care through [facility]. His last office visit was 04/02/2023 for a hospital follow up, after being admitted for chest pain, hiccups, mild troponin elevation (determined to be demand mediated). At this time his treatment plan was as follows: sotalol 160 mg BID, Jardiance 25  mg daily, spironolactone 12.5 mg daily,  PRN torsemide 20 mg, valsartan 80 mg daily, ASA 81 mg daily, atorvastatin 80 mg daily.   He was seen by Dr. Lynnette Caffey on 06/09/2023 for cardiac clearance prior to vascular surgery.   He underwent RHC/LHC on 06/15/23 which showed: severe native vessel CAD with severe distal left main stenosis, total occlusion of the proximal circumflex, severe proximal LAD stenosis, and severe diffuse RCA stenosis. Status post CABG with chronic occlusion of the SVG to OM and wide patency of the LIMA to  LAD. The LIMA graft is large in caliber, widely patent, and fills a large area of myocardium as it backfills the proximal LAD and its branches, also supplies collaterals to the circumflex. Preserved cardiac output with elevated diastolic filling pressures.   Underwent echocardiogram on 06/15/23 which showed: LVEF 45-50%, LV RWMA, grade III diastolic dysfunction, LV inferior wall akinetic, posterior wall hypokinetic, normal RV size and function, moderately dilated left atrium, mildly dilated right atrium, mild MR   Patient then underwent underwent left common femoral to above knee popliteal bypass on 06/24/2023 by Dr. Lenell Antu for PAD.   Patient was last seen by Dr. Laural Benes for hospital follow up. He was doing well post-op. At this time it was noted that patient was taking ASA 81 mg daily, Lipitor 80 mg daily, valsartan 80 mg daily, Jardiance 25 mg daily, PRN torsemide 20 mg for swelling (patient reported hypokalemia with scheduled torsemide).  Hospital Course     Patient was admitted to observation on 07/24/2023 to monitor him on telemetry and ensure there were no additional episodes of VT. Upon review of the interrogation report from [facility] it showed a highly irregular rhythm. Before his shock that was administered prior to admission he was symptomatic feeling palpitations and dizziness, which was different than prior shock prodromes. Per MD, suspects that the patient likely had an inappropriate shock for atrial fibrillation  with RVR rather than true VT. Patient does have a history of VT and has frequent PVCs.   He was continuined on his sotalol 160 mg BID for his history of VT. Started on Lopressor 25 mg BID for rate control. Continued on his Jardiance 25 mg daily, Lipitor 80 mg daily, irbesartan 75 mg daily (replacing PTA valsartan), spironolactone 12.5 mg daily, and ASA 81 mg daily. Patient was admitted for observation to monitor on telemetry.   This morning, patient reports feeling better.  He was seen by [employer] who made some program adjustments and patient was deemed medically stable for discharge. Due to patient having suspected atrial fibrillation will start patient on Eliquis 5 mg BID.   Close follow up made with EP outpatient.     Did the patient have an acute coronary syndrome (MI, NSTEMI, STEMI, etc) this admission?:  No                               Did the patient have a percutaneous coronary intervention (stent / angioplasty)?:  No.    CHA2DS2-VASc Score = 6   This indicates a 9.7% annual risk of stroke. The patient's score is based upon: CHF History: 1 HTN History: 1 Diabetes History: 1 Stroke History: 2 Vascular Disease History: 1 Age Score: 0 Gender Score: 0     _____________  Discharge Vitals Blood pressure 106/80, pulse (!) 59, temperature 97.7 F (36.5 C), temperature source Oral, resp. rate 18, height 5\' 9"  (1.753 m), weight 97.3 kg, SpO2 98%.  Filed Weights   07/24/23 1043 07/25/23 0450  Weight: 103.9 kg 97.3 kg   Labs & Radiologic Studies    CBC Recent Labs    07/24/23 1049 07/24/23 2007  WBC 4.6 3.9*  HGB 15.9 15.0  HCT 48.3 46.1  MCV 94.3 92.6  PLT 89* 93*   Basic Metabolic Panel Recent Labs    91/47/82 1049 07/24/23 2007 07/25/23 0233  NA 136  --  138  K 4.2  --  3.9  CL 101  --  106  CO2 24  --  23  GLUCOSE 222*  --  264*  BUN 19  --  21*  CREATININE 1.23 1.14 0.99  CALCIUM 8.7*  --  8.4*  MG  --   --  2.1   Liver Function Tests No results for input(s): "AST", "ALT", "ALKPHOS", "BILITOT", "PROT", "ALBUMIN" in the last 72 hours. No results for input(s): "LIPASE", "AMYLASE" in the last 72 hours. High Sensitivity Troponin:   Recent Labs  Lab 07/24/23 1049 07/24/23 1900  TROPONINIHS 40* 61*    BNP Invalid input(s): "POCBNP" D-Dimer No results for input(s): "DDIMER" in the last 72 hours. Hemoglobin A1C No results for input(s): "HGBA1C" in the last 72 hours. Fasting Lipid Panel No results  for input(s): "CHOL", "HDL", "LDLCALC", "TRIG", "CHOLHDL", "LDLDIRECT" in the last 72 hours. Thyroid Function Tests Recent Labs    07/25/23 0233  TSH 1.562  _____________  DG Chest 2 View Result Date: 07/24/2023 CLINICAL DATA:  Defibrillator firing. EXAM: CHEST - 2 VIEW COMPARISON:  Two-view chest x-ray 07/01/2023. FINDINGS: AICD entering via left subclavian approach is stable. Heart size is normal. Mild atelectasis is again seen at the left base. The lungs are otherwise clear. The visualized soft tissues and bony thorax are unremarkable. IMPRESSION: 1. Mild left basilar atelectasis. 2. No acute cardiopulmonary disease. Electronically Signed   By: Virl Son.D.  On: 07/24/2023 11:46   VAS [place] LOWER EXTREMITY ARTERIAL DUPLEX Result Date: 07/23/2023 LOWER EXTREMITY ARTERIAL DUPLEX STUDY Patient Name:  Michael Barnett  Date of Exam:   07/23/2023 Medical Rec #: [id number]        Accession #:    [id number] Date of Birth: [birth year]        Patient Gender: M Patient Age:   23 years Exam Location:  [facility] Procedure:      VAS [place] LOWER EXTREMITY ARTERIAL DUPLEX Referring Phys: Heath Lark --------------------------------------------------------------------------------  Indications: Claudication, peripheral artery disease, and Patient reports              painful distal thigh pain near incision site. High Risk Factors: Hypertension, coronary artery disease.  Vascular Interventions: BYPASS GRAFT FEMORAL- ABOVE KNEE POPLITEAL ARTERY (Left)                         06/24/23. Current ABI:            R 0.62 L 0.67 Performing Technologist: Argentina Ponder RVS  Examination Guidelines: A complete evaluation includes B-mode imaging, spectral Doppler, color Doppler, and power Doppler as needed of all accessible portions of each vessel. Bilateral testing is considered an integral part of a complete examination. Limited examinations for reoccurring indications may be performed as noted.   Left  Graft #1: fem-pop +--------------------+--------+--------+--------+--------+                     PSV cm/sStenosisWaveformComments +--------------------+--------+--------+--------+--------+ Inflow              160             biphasic         +--------------------+--------+--------+--------+--------+ Proximal Anastomosis152             biphasic         +--------------------+--------+--------+--------+--------+ Proximal Graft      68              biphasic         +--------------------+--------+--------+--------+--------+ Mid Graft           48              biphasic         +--------------------+--------+--------+--------+--------+ Distal Graft        48              biphasic         +--------------------+--------+--------+--------+--------+ Distal Anastomosis  87              biphasic         +--------------------+--------+--------+--------+--------+ Outflow             48              biphasic         +--------------------+--------+--------+--------+--------+   Summary: Left: Patent femoral- above knee popliteal bypass graft. anechoic structure noted in the distal thigh near incision site measuring 4.18 cm x 3.27cm.  See table(s) above for measurements and observations. Electronically signed by Carolynn Sayers on 07/23/2023 at 11:55:25 AM.    Final    VAS [place] ABI WITH/WO TBI Result Date: 07/23/2023  LOWER EXTREMITY DOPPLER STUDY Patient Name:  Michael Barnett  Date of Exam:   07/23/2023 Medical Rec #: [id number]        Accession #:    [id number] Date of Birth: Aug 10, 1970        Patient Gender: M Patient Age:   65  years Exam Location:  [facility] Procedure:      VAS [place] ABI WITH/WO TBI Referring Phys: Heath Lark --------------------------------------------------------------------------------  Indications: Claudication, and peripheral artery disease. High Risk Factors: Hypertension, Diabetes, coronary artery disease.  Vascular Interventions: BYPASS GRAFT  FEMORAL- ABOVE KNEE POPLITEAL ARTERY (Left)                         06/24/23. Comparison Study: 05/23/23 Performing Technologist: [place] Ponder RVS  Examination Guidelines: A complete evaluation includes at minimum, Doppler waveform signals and systolic blood pressure reading at the level of bilateral brachial, anterior tibial, and posterior tibial arteries, when vessel segments are accessible. Bilateral testing is considered an integral part of a complete examination. Photoelectric Plethysmograph (PPG) waveforms and toe systolic pressure readings are included as required and additional duplex testing as needed. Limited examinations for reoccurring indications may be performed as noted.  ABI Findings: +---------+------------------+-----+----------+--------+ Right    Rt Pressure (mmHg)IndexWaveform  Comment  +---------+------------------+-----+----------+--------+ Brachial 129                                       +---------+------------------+-----+----------+--------+ PTA      77                0.60 monophasic         +---------+------------------+-----+----------+--------+ DP       80                0.62 monophasic         +---------+------------------+-----+----------+--------+ Great Toe59                0.46                    +---------+------------------+-----+----------+--------+ +---------+------------------+-----+----------+-------+ Left     Lt Pressure (mmHg)IndexWaveform  Comment +---------+------------------+-----+----------+-------+ Brachial 125                                      +---------+------------------+-----+----------+-------+ PTA      75                0.58 monophasic        +---------+------------------+-----+----------+-------+ DP       87                0.67 monophasic        +---------+------------------+-----+----------+-------+ Great Toe72                0.56                    +---------+------------------+-----+----------+-------+ +-------+-----------+-----------+------------+------------+ ABI/TBIToday's ABIToday's TBIPrevious ABIPrevious TBI +-------+-----------+-----------+------------+------------+ Right  0.62       0.46       0.75        0.41         +-------+-----------+-----------+------------+------------+ Left   0.67       0.56       0.76        0.42         +-------+-----------+-----------+------------+------------+ Bilateral ABIs appear essentially unchanged compared to prior study on 05/26/23.  Summary: Right: Resting right ankle-brachial index indicates moderate right lower extremity arterial disease. The right toe-brachial index is abnormal. Left: Resting left ankle-brachial index indicates moderate left lower extremity arterial disease. The left toe-brachial index is abnormal. *See  table(s) above for measurements and observations.  Electronically signed by Carolynn Sayers on 07/23/2023 at 11:54:32 AM.    Final    VAS [place] LOWER EXTREMITY VENOUS (DVT) Result Date: 07/14/2023  Lower Venous DVT Study Patient Name:  Michael Barnett  Date of Exam:   07/14/2023 Medical Rec #: [id number]        Accession #:    [id number] Date of Birth: 06-06-1971        Patient Gender: M Patient Age:   89 years Exam Location:  [facility] Procedure:      VAS [place] LOWER EXTREMITY VENOUS (DVT) Referring Phys: Jonah Blue --------------------------------------------------------------------------------  Indications: Swelling, Pain, Edema, Post-op, and Left femoral-popliteal bypass.  Comparison Study: No prior exam. Performing Technologist: Fernande Bras  Examination Guidelines: A complete evaluation includes B-mode imaging, spectral Doppler, color Doppler, and power Doppler as needed of all accessible portions of each vessel. Bilateral testing is considered an integral part of a complete examination. Limited examinations for reoccurring indications may be performed as  noted. The reflux portion of the exam is performed with the patient in reverse Trendelenburg.  +-----+---------------+---------+-----------+----------+--------------+ RIGHTCompressibilityPhasicitySpontaneityPropertiesThrombus Aging +-----+---------------+---------+-----------+----------+--------------+ CFV  Full           Yes      Yes                                 +-----+---------------+---------+-----------+----------+--------------+ SFJ  Full           Yes      Yes                                 +-----+---------------+---------+-----------+----------+--------------+   +---------+---------------+---------+-----------+----------+--------------+ LEFT     CompressibilityPhasicitySpontaneityPropertiesThrombus Aging +---------+---------------+---------+-----------+----------+--------------+ CFV      Full           Yes      Yes                                 +---------+---------------+---------+-----------+----------+--------------+ SFJ      Full           Yes      Yes                                 +---------+---------------+---------+-----------+----------+--------------+ FV Prox  Full                                                        +---------+---------------+---------+-----------+----------+--------------+ FV Mid   Full                                                        +---------+---------------+---------+-----------+----------+--------------+ FV DistalFull                                                        +---------+---------------+---------+-----------+----------+--------------+  PFV      Full                                                        +---------+---------------+---------+-----------+----------+--------------+ POP      Full           Yes      Yes                                 +---------+---------------+---------+-----------+----------+--------------+ PTV      Full                                                         +---------+---------------+---------+-----------+----------+--------------+ PERO     Full                                                        +---------+---------------+---------+-----------+----------+--------------+ Cystic structures noted in left distal thigh measuring 4.9 x 2.1 x 3.5 cm and 6.2 x 1.0 x 7.6 cm. Graft patent.   Summary: RIGHT: - No evidence of common femoral vein obstruction.   LEFT: - There is no evidence of deep vein thrombosis in the lower extremity.  - No cystic structure found in the popliteal fossa.  *See table(s) above for measurements and observations. Electronically signed by Coral Else MD on 07/14/2023 at 8:35:12 PM.    Final    DG Chest 2 View Result Date: 07/01/2023 CLINICAL DATA:  Chest pain and hiccups for 3 days. EXAM: CHEST - 2 VIEW COMPARISON:  Chest radiographs 06/01/2022, 12/21/2021 FINDINGS: Left chest wall cardiac AICD is again seen. Status post median sternotomy. Cardiac silhouette and mediastinal contours are within normal limits. Unchanged mild linear scarring within the lingula. No acute airspace opacity. No pleural effusion or pneumothorax. Mild-to-moderate multilevel degenerative disc changes of the thoracic spine. IMPRESSION: No active cardiopulmonary disease. Electronically Signed   By: Neita Garnet M.D.   On: 07/01/2023 15:40   Disposition   Pt is being discharged home today in good condition.  Follow-up Plans & Appointments   Follow up appointment made with EP outpatient on 08/13/2023 Discharge Instructions     Call MD for:  persistant dizziness or light-headedness   Complete by: As directed    Diet - low sodium heart healthy   Complete by: As directed        Discharge Medications   Allergies as of 07/25/2023       Reactions   Victoza [liraglutide] Other (See Comments)   Gave pt pancreatitis        Medication List     TAKE these medications    apixaban 5 MG Tabs tablet Commonly known as:  Eliquis Take 1 tablet (5 mg total) by mouth 2 (two) times daily.   aspirin 81 MG chewable tablet Chew 81 mg by mouth daily.   atorvastatin 80 MG tablet Commonly known as: LIPITOR Take 80 mg by mouth daily.   empagliflozin 25  MG Tabs tablet Commonly known as: JARDIANCE Take 25 mg by mouth daily.   FreeStyle Libre 3 Plus Sensor Misc Change sensor every 15 days.   FreeStyle Libre 3 Reader Devi UAD to monitor blood sugar   insulin glargine 100 unit/mL Sopn Commonly known as: LANTUS Inject 15 Units into the skin at bedtime.   metoprolol tartrate 25 MG tablet Commonly known as: LOPRESSOR Take 1 tablet (25 mg total) by mouth 2 (two) times daily.   nitroGLYCERIN 0.4 MG SL tablet Commonly known as: NITROSTAT Place 0.4 mg under the tongue every 5 (five) minutes as needed for chest pain.   oxyCODONE 5 MG immediate release tablet Commonly known as: Oxy IR/ROXICODONE Take 1 tablet (5 mg total) by mouth every 6 (six) hours as needed for breakthrough pain.   oxyCODONE-acetaminophen 10-325 MG tablet Commonly known as: PERCOCET Take 1 tablet by mouth every 4 (four) hours as needed for pain.   pantoprazole 40 MG tablet Commonly known as: PROTONIX Take 40 mg by mouth 2 (two) times daily.   sotalol 160 MG tablet Commonly known as: BETAPACE Take 160 mg by mouth 2 (two) times daily.   spironolactone 25 MG tablet Commonly known as: ALDACTONE Take 12.5 mg by mouth daily.   torsemide 20 MG tablet Commonly known as: DEMADEX Take 20 mg by mouth daily as needed (swelling).   True Metrix Blood Glucose Test test strip Generic drug: glucose blood Use as instructed   TRUEdraw Lancing Device Misc Use as directed up to four times daily   TRUEplus Lancets 28G Misc Use as directed with testing supplies up to 4 times daily.   valsartan 80 MG tablet Commonly known as: DIOVAN Take 1 tablet (80 mg total) by mouth daily.         Duration of Discharge Encounter: APP Time: 20 minutes    Signed, [name], PA-C 07/25/2023, 1:11 PM

## 2023-07-26 ENCOUNTER — Telehealth: Payer: Self-pay

## 2023-07-26 NOTE — Progress Notes (Unsigned)
[facility] [place]  ASSESSMENT / PLAN: Michael Barnett is a 53 y.o. male with atherosclerosis of native arteries of left lower extremity causing ulceration.  Patient counseled patients with chronic limb threatening ischemia have an annual risk of cardiovascular mortality of 25% and a high risk of amputation.   WIfI score calculated based on clinical exam and non-invasive measurements. 2 / 1 / 0. Stage III.   Recommend:  Abstinence from all tobacco products. Blood glucose control with goal A1c < 7%. Blood pressure control with goal blood pressure < 140/90 mmHg. Lipid reduction therapy with goal LDL-C <70   Aspirin 81mg  PO QD.  Atorvastatin 40-80mg  PO QD (or other "high intensity" statin therapy).  Plan left lower extremity angiogram with possible intervention via right common femoral approach in cath lab as soon as possible.   CHIEF COMPLAINT: left great toe ulcer  HISTORY OF PRESENT ILLNESS: Michael Barnett is a 53 y.o. male referred to clinic for evaluation of left great toe ulcer by Dr. Ralene Cork.  The patient has a strong personal history of atherosclerotic disease.  He had coronary artery bypass grafting in [place] several years ago.  He is a diabetic with poorly controlled diabetes.  He reports a left great toe injury several months ago with persistent pain.  He was noted to have a small ulcer on his left great toe by Dr. Ralene Cork during her evaluation about a week ago, and referred for further management.   Past Medical History:  Diagnosis Date   AICD (automatic cardioverter/defibrillator) present    Arthritis    CHF (congestive heart failure) (HCC)    Coronary artery disease    Defibrillator activation    Diabetes mellitus    Hypertension    Myocardial infarction [facility])    Sleep apnea    Stroke [facility])     Past Surgical History:  Procedure Laterality Date   ABDOMINAL AORTOGRAM W/LOWER EXTREMITY N/A 06/04/2023   Procedure: ABDOMINAL AORTOGRAM W/LOWER  EXTREMITY;  Surgeon: Leonie Douglas, MD;  Location: [facility] INVASIVE CV LAB;  Service: Cardiovascular;  Laterality: N/A;   CORONARY ARTERY BYPASS GRAFT     FEMORAL-POPLITEAL BYPASS GRAFT Left 06/24/2023   Procedure: BYPASS GRAFT FEMORAL- ABOVE KNEE POPLITEAL ARTERY;  Surgeon: Leonie Douglas, MD;  Location: [facility] OR;  Service: Vascular;  Laterality: Left;   RIGHT/LEFT HEART CATH AND CORONARY/GRAFT ANGIOGRAPHY N/A 06/15/2023   Procedure: RIGHT/LEFT HEART CATH AND CORONARY/GRAFT ANGIOGRAPHY;  Surgeon: Tonny Bollman, MD;  Location: [facility] INVASIVE CV LAB;  Service: Cardiovascular;  Laterality: N/A;    Family History  Problem Relation Age of Onset   Heart attack Mother    Heart attack Father    Heart attack Maternal Uncle     Social History   Socioeconomic History   Marital status: Single    Spouse name: Not on file   Number of children: Not on file   Years of education: Not on file   Highest education level: Not on file  Occupational History   Not on file  Tobacco Use   Smoking status: Every Day    Current packs/day: 0.50    Average packs/day: 0.5 packs/day for 35.1 years (17.5 ttl pk-yrs)    Types: Cigarettes    Start date: 39   Smokeless tobacco: Not on file  Vaping Use   Vaping status: Never Used  Substance and Sexual Activity   Alcohol use: No   Drug use: Never   Sexual activity: Yes  Other Topics Concern  Not on file  Social History Narrative   Not on file   Social Drivers of Health   Financial Resource Strain: Low Risk  (03/25/2023)   Received from [facility]   Overall [employer] Strain (CARDIA)    Difficulty of Paying Living Expenses: Not hard at all  Food Insecurity: No Food Insecurity (06/24/2023)   Hunger Vital Sign    Worried About Running Out of Food in the Last Year: Never true    Ran Out of Food in the Last Year: Never true  Transportation Needs: No Transportation Needs (06/24/2023)   PRAPARE - Scientist, research (physical sciences) (Medical): No    Lack of Transportation (Non-Medical): No  Physical Activity: Not on file  Stress: Not on file  Social Connections: Not on file  Intimate Partner Violence: Not At Risk (06/24/2023)   Humiliation, Afraid, Rape, and Kick questionnaire    Fear of Current or Ex-Partner: No    Emotionally Abused: No    Physically Abused: No    Sexually Abused: No    Allergies  Allergen Reactions   Victoza [Liraglutide] Other (See Comments)    Gave pt pancreatitis    Current Outpatient Medications  Medication Sig Dispense Refill   apixaban (ELIQUIS) 5 MG TABS tablet Take 1 tablet (5 mg total) by mouth 2 (two) times daily. 180 tablet 3   aspirin 81 MG chewable tablet Chew 81 mg by mouth daily.     atorvastatin (LIPITOR) 80 MG tablet Take 80 mg by mouth daily.     Continuous Glucose Receiver (FREESTYLE LIBRE 3 READER) DEVI UAD to monitor blood sugar 1 each 0   Continuous Glucose Sensor (FREESTYLE LIBRE 3 PLUS SENSOR) MISC Change sensor every 15 days. 2 each 6   empagliflozin (JARDIANCE) 25 MG TABS tablet Take 25 mg by mouth daily.     Fingerstix Lancets MISC Use as directed with testing supplies up to 4 times daily. 100 each 0   glucose blood test strip Use as instructed 100 each 12   insulin glargine (LANTUS) 100 unit/mL SOPN Inject 15 Units into the skin at bedtime.     Lancet Devices (TRUEDRAW LANCING DEVICE) MISC Use as directed up to four times daily 1 each 0   metoprolol tartrate (LOPRESSOR) 25 MG tablet Take 1 tablet (25 mg total) by mouth 2 (two) times daily. 90 tablet 3   nitroGLYCERIN (NITROSTAT) 0.4 MG SL tablet Place 0.4 mg under the tongue every 5 (five) minutes as needed for chest pain. (Patient not taking: Reported on 07/12/2023)     oxyCODONE (OXY IR/ROXICODONE) 5 MG immediate release tablet Take 1 tablet (5 mg total) by mouth every 6 (six) hours as needed for breakthrough pain. 20 tablet 0   oxyCODONE-acetaminophen (PERCOCET) 10-325 MG tablet Take 1 tablet by  mouth every 4 (four) hours as needed for pain.     pantoprazole (PROTONIX) 40 MG tablet Take 40 mg by mouth 2 (two) times daily.     sotalol (BETAPACE) 160 MG tablet Take 160 mg by mouth 2 (two) times daily.     spironolactone (ALDACTONE) 25 MG tablet Take 12.5 mg by mouth daily.     torsemide (DEMADEX) 20 MG tablet Take 20 mg by mouth daily as needed (swelling).     valsartan (DIOVAN) 80 MG tablet Take 1 tablet (80 mg total) by mouth daily. 30 tablet 3   No current facility-administered medications for this visit.    PHYSICAL EXAM There were no  vitals filed for this visit.  Middle-age man in no distress Regular rate and rhythm Unlabored breathing No palpable pedal pulses Small left medial great toe ulcer with clean base  PERTINENT LABORATORY AND RADIOLOGIC DATA  Most recent CBC    Latest Ref Rng & Units 07/24/2023    8:07 PM 07/24/2023   10:49 AM 07/01/2023    1:00 PM  CBC  WBC 4.0 - 10.5 K/uL 3.9  4.6  7.4   Hemoglobin 13.0 - 17.0 g/dL 16.1  09.6  04.5   Hematocrit 39.0 - 52.0 % 46.1  48.3  47.0   Platelets 150 - 400 K/uL 93  89  121      Most recent CMP    Latest Ref Rng & Units 07/25/2023    2:33 AM 07/24/2023    8:07 PM 07/24/2023   10:49 AM  CMP  Glucose 70 - 99 mg/dL 409   811   BUN 6 - 20 mg/dL 21   19   Creatinine 9.14 - 1.24 mg/dL 7.82  9.56  2.13   Sodium 135 - 145 mmol/L 138   136   Potassium 3.5 - 5.1 mmol/L 3.9   4.2   Chloride 98 - 111 mmol/L 106   101   CO2 22 - 32 mmol/L 23   24   Calcium 8.9 - 10.3 mg/dL 8.4   8.7      Zury Fazzino N. Lenell Antu, MD FACS [facility] Phone Number: [phone] 07/26/2023 9:57 AM   Total time spent on preparing this encounter including chart review, data review, collecting history, examining the patient, coordinating care for this new patient, 60 minutes.  Portions of this report may have been transcribed using voice recognition software.  Every effort has been made to ensure accuracy;  however, inadvertent computerized transcription errors may still be present.

## 2023-07-26 NOTE — Transitions of Care (Post Inpatient/ED Visit) (Signed)
   07/26/2023  Name: Michael Barnett MRN: [id number] DOB: 04-Sep-1970  Today's TOC FU Call Status: Today's TOC FU Call Status:: Successful TOC FU Call Completed TOC FU Call Complete Date: 07/25/23 Patient's Name and Date of Birth confirmed.  Transition Care Management Follow-up Telephone Call Date of Discharge: 07/25/23 Discharge Facility: [facility]) How have you been since you were released from the hospital?: Better Any questions or concerns?: No  Items Reviewed: Did you receive and understand the discharge instructions provided?: No Medications obtained,verified, and reconciled?: Yes (Medications Reviewed) Any new allergies since your discharge?: No Dietary orders reviewed?: Yes Type of Diet Ordered:: low sodium heart healthy Do you have support at home?: Yes People in Home: significant other Name of Support/Comfort Primary Source: fiance  Medications Reviewed Today: Medications Reviewed Today   Medications were not reviewed in this encounter     Home Care and Equipment/Supplies: Were Home Health Services Ordered?: No Any new equipment or medical supplies ordered?: No  Functional Questionnaire: Do you need assistance with bathing/showering or dressing?: No Do you need assistance with meal preparation?: Yes Do you need assistance with eating?: No Do you have difficulty maintaining continence: No Do you need assistance with getting out of bed/getting out of a chair/moving?: No Do you have difficulty managing or taking your medications?: No  Follow up appointments reviewed: PCP Follow-up appointment confirmed?: Yes Date of PCP follow-up appointment?: 07/11/23 Follow-up Provider: Cena Benton Specialist [facility] Follow-up appointment confirmed?: Yes Date of Specialist follow-up appointment?: 07/13/23 Follow-Up Specialty Provider:: Michae; Alejandro Mulling Do you need transportation to your follow-up appointment?: No Do you understand care options if your condition(s)  worsen?: Yes-patient verbalized understanding    SIGNATURE: Elsie Lincoln, RN

## 2023-07-27 ENCOUNTER — Encounter: Payer: Self-pay | Admitting: Vascular Surgery

## 2023-07-27 ENCOUNTER — Ambulatory Visit (INDEPENDENT_AMBULATORY_CARE_PROVIDER_SITE_OTHER): Payer: Medicaid Other | Admitting: Vascular Surgery

## 2023-07-27 VITALS — BP 113/72 | HR 61 | Temp 98.0°F | Resp 20 | Ht 69.0 in | Wt 222.0 lb

## 2023-07-27 DIAGNOSIS — I70245 Atherosclerosis of native arteries of left leg with ulceration of other part of foot: Secondary | ICD-10-CM

## 2023-08-04 ENCOUNTER — Encounter: Payer: Self-pay | Admitting: Podiatry

## 2023-08-04 ENCOUNTER — Ambulatory Visit (INDEPENDENT_AMBULATORY_CARE_PROVIDER_SITE_OTHER): Payer: Medicaid Other | Admitting: Podiatry

## 2023-08-04 DIAGNOSIS — L84 Corns and callosities: Secondary | ICD-10-CM | POA: Diagnosis not present

## 2023-08-04 DIAGNOSIS — I739 Peripheral vascular disease, unspecified: Secondary | ICD-10-CM | POA: Diagnosis not present

## 2023-08-04 DIAGNOSIS — E1142 Type 2 diabetes mellitus with diabetic polyneuropathy: Secondary | ICD-10-CM

## 2023-08-04 DIAGNOSIS — Z87898 Personal history of other specified conditions: Secondary | ICD-10-CM

## 2023-08-04 NOTE — Progress Notes (Signed)
  Subjective:  Patient ID: Michael Barnett, male    DOB: [birth year],   MRN: [id number]  No chief complaint on file.   53 y.o. male presents for  follow-up of left great toe ulcer. Relates continuing to do well. No changes  Has undergone angiogram and fem pop  bypass and doing well.  PCP:  Vicci Barnie NOVAK, MD    . Denies any other pedal complaints. Denies n/v/f/c.   Past Medical History:  Diagnosis Date   AICD (automatic cardioverter/defibrillator) present    Arthritis    CHF (congestive heart failure) (HCC)    Coronary artery disease    Defibrillator activation    Diabetes mellitus    Hypertension    Myocardial infarction [facility])    Sleep apnea    Stroke (HCC)     Objective:  Physical Exam: Vascular: DP/PT pulses non palpable bilateral. CFT <3 seconds. Absent hair growth on digits. Edema noted to bilateral lower extremities. Xerosis noted bilaterally.  Skin. No lacerations or abrasions bilateral feet. Nails 1-5 bilateral  are thickened discolored and elongated with subungual debris. Hyperkeratotic lesions noted sub fourth metatarsal on left. Dorsal medial left hallux with hyperkeratosis and no underlying ulceration noted.  Musculoskeletal: MMT 5/5 bilateral lower extremities in DF, PF, Inversion and Eversion. Deceased ROM in DF of ankle joint.  Neurological: Sensation intact to light touch. Protective sensation diminished bilateral.    Assessment:   1. Pre-ulcerative calluses   2. Type 2 diabetes mellitus with peripheral neuropathy (HCC)   3. PAD (peripheral artery disease) (HCC)   4. History of ulceration         Plan:  Patient was evaluated and treated and all questions answered. -Discussed and educated patient on diabetic foot care, especially with  regards to the vascular, neurological and musculoskeletal systems.  -Stressed the importance of good glycemic control and the detriment of not  controlling glucose levels in relation to the foot. -Discussed supportive  shoes at all times and checking feet regularly.  Ulcer dorsal left great toe -healed.   -Debridement of hyperkeratotic tissue without incident and no underlying ulceration at plantar fourth metatarsal head.  -DM shoes ordered.   -No abx indicated.  -Discussed glucose control and proper protein-rich diet.  -Discussed if any worsening redness, pain, fever or chills to call or may need to report to the emergency room. Patient expressed understanding.    Return in about 3 months (around 11/01/2023) for rfc.   Asberry Failing, DPM

## 2023-08-05 ENCOUNTER — Ambulatory Visit: Payer: Self-pay | Admitting: Internal Medicine

## 2023-08-05 NOTE — Telephone Encounter (Signed)
 Noted.

## 2023-08-05 NOTE — Telephone Encounter (Signed)
 Copied from CRM [id number]. Topic: Clinical - Red Word Triage >> Aug 05, 2023 12:21 PM Fredrica W wrote: Red Word that prompted transfer to Nurse Triage: swelling both legs (only one usually swells), difficulty breathing when laying down. Thinks there may be fluid on lungs or heart. Heart  patient.     Chief Complaint: SOB Symptoms: SOB, cough Disposition: [x] ED /[] Urgent Care (no appt availability in office) / [] Appointment(In office/virtual)/ []  [facility]/ [] Home Care/ [] Refused Recommended Disposition /[]  Mobile Bus/ []  Follow-up with PCP Additional Notes: Patient stated he is experiencing SOB for the past 4 days and coughing for a week. His shortness of breath is constant while at rest. It is worse when laying down and he is panting for air at night. He also has swelling in his legs. He stated he thinks he has fluid in his lungs. Patient was advised to be seen at ED. Patient verbalized understanding and he will have somebody take him now.   Reason for Disposition  [1] MODERATE difficulty breathing (e.g., speaks in phrases, SOB even at rest, pulse 100-120) AND [2] NEW-onset or WORSE than normal  Answer Assessment - Initial Assessment Questions 1. RESPIRATORY STATUS: Describe your breathing? (e.g., wheezing, shortness of breath, unable to speak, severe coughing)      SOB all day but worse when laying down, panting at night  2. ONSET: When did this breathing problem begin?      4 days ago  3. PATTERN Does the difficult breathing come and go, or has it been constant since it started?      Constant  4. CARDIAC HISTORY: Do you have any history of heart disease? (e.g., heart attack, angina, bypass surgery, angioplasty)      CHF    5. CAUSE: What do you think is causing the breathing problem?      Thinks has fluid in lungs  6. OTHER SYMPTOMS: Do you have any other symptoms? (e.g., dizziness, runny nose, cough, chest pain, fever)     Cough for 1 week,  yellow phlegm  7. O2 SATURATION MONITOR:  Do you use an oxygen saturation monitor (pulse oximeter) at home? If Yes, ask: What is your reading (oxygen level) today? What is your usual oxygen saturation reading? (e.g., 95%)     No O2 monitor at home  Protocols used: Breathing Difficulty-A-AH

## 2023-08-06 ENCOUNTER — Emergency Department (HOSPITAL_COMMUNITY)
Admission: EM | Admit: 2023-08-06 | Discharge: 2023-08-06 | Disposition: A | Discharge status: Home / Self Care | Payer: Medicaid Other | Attending: Emergency Medicine | Admitting: Emergency Medicine

## 2023-08-06 ENCOUNTER — Emergency Department (HOSPITAL_COMMUNITY): Payer: Medicaid Other

## 2023-08-06 ENCOUNTER — Other Ambulatory Visit: Payer: Self-pay

## 2023-08-06 DIAGNOSIS — R0602 Shortness of breath: Secondary | ICD-10-CM | POA: Diagnosis present

## 2023-08-06 DIAGNOSIS — Z7901 Long term (current) use of anticoagulants: Secondary | ICD-10-CM | POA: Diagnosis not present

## 2023-08-06 DIAGNOSIS — Z79899 Other long term (current) drug therapy: Secondary | ICD-10-CM | POA: Insufficient documentation

## 2023-08-06 DIAGNOSIS — Z7982 Long term (current) use of aspirin: Secondary | ICD-10-CM | POA: Insufficient documentation

## 2023-08-06 DIAGNOSIS — Z794 Long term (current) use of insulin: Secondary | ICD-10-CM | POA: Diagnosis not present

## 2023-08-06 DIAGNOSIS — Z8673 Personal history of transient ischemic attack (TIA), and cerebral infarction without residual deficits: Secondary | ICD-10-CM | POA: Diagnosis not present

## 2023-08-06 DIAGNOSIS — Z20822 Contact with and (suspected) exposure to covid-19: Secondary | ICD-10-CM | POA: Insufficient documentation

## 2023-08-06 DIAGNOSIS — F17210 Nicotine dependence, cigarettes, uncomplicated: Secondary | ICD-10-CM | POA: Diagnosis not present

## 2023-08-06 DIAGNOSIS — Z95810 Presence of automatic (implantable) cardiac defibrillator: Secondary | ICD-10-CM | POA: Diagnosis not present

## 2023-08-06 DIAGNOSIS — I110 Hypertensive heart disease with heart failure: Secondary | ICD-10-CM | POA: Diagnosis not present

## 2023-08-06 DIAGNOSIS — E119 Type 2 diabetes mellitus without complications: Secondary | ICD-10-CM | POA: Insufficient documentation

## 2023-08-06 DIAGNOSIS — I5033 Acute on chronic diastolic (congestive) heart failure: Secondary | ICD-10-CM | POA: Insufficient documentation

## 2023-08-06 DIAGNOSIS — E669 Obesity, unspecified: Secondary | ICD-10-CM | POA: Diagnosis not present

## 2023-08-06 DIAGNOSIS — I2510 Atherosclerotic heart disease of native coronary artery without angina pectoris: Secondary | ICD-10-CM | POA: Insufficient documentation

## 2023-08-06 DIAGNOSIS — I509 Heart failure, unspecified: Secondary | ICD-10-CM

## 2023-08-06 LAB — BRAIN NATRIURETIC PEPTIDE: B Natriuretic Peptide: 1067 pg/mL — ABNORMAL HIGH (ref 0.0–100.0)

## 2023-08-06 LAB — CBC WITH DIFFERENTIAL/PLATELET
Abs Immature Granulocytes: 0.02 10*3/uL (ref 0.00–0.07)
Basophils Absolute: 0.1 10*3/uL (ref 0.0–0.1)
Basophils Relative: 1 %
Eosinophils Absolute: 0.1 10*3/uL (ref 0.0–0.5)
Eosinophils Relative: 2 %
HCT: 49.6 % (ref 39.0–52.0)
Hemoglobin: 15.7 g/dL (ref 13.0–17.0)
Immature Granulocytes: 0 %
Lymphocytes Relative: 30 %
Lymphs Abs: 1.8 10*3/uL (ref 0.7–4.0)
MCH: 30.2 pg (ref 26.0–34.0)
MCHC: 31.7 g/dL (ref 30.0–36.0)
MCV: 95.4 fL (ref 80.0–100.0)
Monocytes Absolute: 0.7 10*3/uL (ref 0.1–1.0)
Monocytes Relative: 11 %
Neutro Abs: 3.5 10*3/uL (ref 1.7–7.7)
Neutrophils Relative %: 56 %
Platelets: 164 10*3/uL (ref 150–400)
RBC: 5.2 MIL/uL (ref 4.22–5.81)
RDW: 14.8 % (ref 11.5–15.5)
WBC: 6.2 10*3/uL (ref 4.0–10.5)
nRBC: 0 % (ref 0.0–0.2)

## 2023-08-06 LAB — COMPREHENSIVE METABOLIC PANEL
ALT: 31 U/L (ref 0–44)
AST: 26 U/L (ref 15–41)
Albumin: 3.4 g/dL — ABNORMAL LOW (ref 3.5–5.0)
Alkaline Phosphatase: 105 U/L (ref 38–126)
Anion gap: 7 (ref 5–15)
BUN: 13 mg/dL (ref 6–20)
CO2: 25 mmol/L (ref 22–32)
Calcium: 8.8 mg/dL — ABNORMAL LOW (ref 8.9–10.3)
Chloride: 107 mmol/L (ref 98–111)
Creatinine, Ser: 1.1 mg/dL (ref 0.61–1.24)
GFR, Estimated: >60 mL/min (ref >60)
Glucose, Bld: 92 mg/dL (ref 70–99)
Potassium: 4.1 mmol/L (ref 3.5–5.1)
Sodium: 139 mmol/L (ref 135–145)
Total Bilirubin: 1.6 mg/dL — ABNORMAL HIGH (ref 0.0–1.2)
Total Protein: 6.6 g/dL (ref 6.5–8.1)

## 2023-08-06 LAB — RESP PANEL BY RT-PCR (RSV, FLU A&B, COVID)  RVPGX2
Influenza A by PCR: NEGATIVE
Influenza B by PCR: NEGATIVE
Resp Syncytial Virus by PCR: NEGATIVE
SARS Coronavirus 2 by RT PCR: NEGATIVE

## 2023-08-06 LAB — TROPONIN I (HIGH SENSITIVITY)
Troponin I (High Sensitivity): 17 ng/L (ref <18)
Troponin I (High Sensitivity): 13 ng/L (ref <18)

## 2023-08-06 LAB — CBG MONITORING, ED: Glucose-Capillary: 65 mg/dL — ABNORMAL LOW (ref 70–99)

## 2023-08-06 MED ORDER — FUROSEMIDE 10 MG/ML IJ SOLN
INTRAMUSCULAR | Status: AC
  Filled 2023-08-06: qty 4

## 2023-08-06 MED ORDER — FUROSEMIDE 10 MG/ML IJ SOLN
40.0000 mg | Freq: Once | INTRAMUSCULAR | Status: DC
  Filled 2023-08-06: qty 4

## 2023-08-06 MED ORDER — FUROSEMIDE 10 MG/ML IJ SOLN
80.0000 mg | Freq: Once | INTRAMUSCULAR | Status: AC
  Administered 2023-08-06: 80 mg via INTRAVENOUS

## 2023-08-06 MED ORDER — IPRATROPIUM-ALBUTEROL 0.5-2.5 (3) MG/3ML IN SOLN
3.0000 mL | Freq: Once | RESPIRATORY_TRACT | Status: AC
  Administered 2023-08-06: 3 mL via RESPIRATORY_TRACT
  Filled 2023-08-06: qty 3

## 2023-08-06 MED ORDER — ALBUTEROL SULFATE HFA 108 (90 BASE) MCG/ACT IN AERS: 1.0000 | INHALATION_SPRAY | Freq: Four times a day (QID) | RESPIRATORY_TRACT | 0 refills | Status: AC | PRN

## 2023-08-06 NOTE — ED Triage Notes (Signed)
 Pt. Stated , Im can't hardly breath when Im sitting or laying down. Im having fluid developing somewhere making it hard to breath. Started 4 days ago.

## 2023-08-06 NOTE — Discharge Instructions (Addendum)
 Please follow-up with your cardiologist next week.  Continue taking your diuretic.  Limit your water and salt intake.  Please take your weight daily  It was a pleasure caring for you today in the emergency department.  Please return to the emergency department for any worsening or worrisome symptoms.

## 2023-08-06 NOTE — ED Provider Notes (Signed)
 [facility] Provider Note  CSN: [id number] Arrival date & time: 08/06/23 9258  Chief Complaint(s) Chest Pain, Shortness of Breath, Foot Swelling, and Palpitations  HPI Michael Barnett is a 53 y.o. male with past medical history as below, significant for CHF, DM, hypertension, sleep apnea, ischemic cardiomyopathy with AICD, diastolic heart failure who presents to the ED with complaint of dyspnea/chest tightness, extremity swelling,   Symptoms ongoing around 1 wk, noticed slight increase swelling to his lower extremities over the past week.  He has orthopnea, exertional dyspnea.  Slight chest tightness over the past few days.  No fevers but did report some chills over the last few days.  No abdominal pain nausea or vomiting, change in bowel or bladder function, compliant with home medications.  No recent travel or sick contacts.   Past Medical History Past Medical History:  Diagnosis Date   AICD (automatic cardioverter/defibrillator) present    Arthritis    CHF (congestive heart failure) (HCC)    Coronary artery disease    Defibrillator activation    Diabetes mellitus    Hypertension    Myocardial infarction [facility])    Sleep apnea    Stroke [facility])    Patient Active Problem List   Diagnosis Date Noted   Ischemic cardiomyopathy with implantable cardioverter-defibrillator (ICD) 07/25/2023   PVC (premature ventricular contraction) 07/25/2023   Paroxysmal atrial fibrillation (HCC) 07/25/2023   CAD (coronary artery disease) 07/25/2023   Hypertension 07/25/2023   Type 2 diabetes mellitus (HCC) 07/25/2023   History of CVA (cerebrovascular accident) 07/25/2023   VT (ventricular tachycardia) (HCC) 07/24/2023   PAD (peripheral artery disease) (HCC) 06/24/2023   Home Medication(s) Prior to Admission medications   Medication Sig Start Date End Date Taking? Authorizing Provider  albuterol  (VENTOLIN  HFA) 108 (90 Base) MCG/ACT inhaler Inhale 1-2 puffs  into the lungs every 6 (six) hours as needed for wheezing or shortness of breath. 08/06/23  Yes Elnor Savant A, DO  apixaban  (ELIQUIS ) 5 MG TABS tablet Take 1 tablet (5 mg total) by mouth 2 (two) times daily. 07/25/23  Yes Parcells, Waddell LABOR, PA-C  aspirin  81 MG chewable tablet Chew 81 mg by mouth daily.   Yes [provider]  atorvastatin  (LIPITOR ) 80 MG tablet Take 80 mg by mouth daily.   Yes [provider]  empagliflozin  (JARDIANCE ) 25 MG TABS tablet Take 25 mg by mouth daily.   Yes [provider]  insulin  glargine (LANTUS ) 100 unit/mL SOPN Inject 15 Units into the skin at bedtime. 07/12/23  Yes Vicci Barnie NOVAK, MD  metoprolol  tartrate (LOPRESSOR ) 25 MG tablet Take 1 tablet (25 mg total) by mouth 2 (two) times daily. 07/25/23  Yes Parcells, Waddell LABOR, PA-C  nitroGLYCERIN  (NITROSTAT ) 0.4 MG SL tablet Place 0.4 mg under the tongue every 5 (five) minutes as needed for chest pain.   Yes [provider]  oxyCODONE -acetaminophen  (PERCOCET) 10-325 MG tablet Take 1 tablet by mouth every 4 (four) hours as needed for pain.   Yes [provider]  pantoprazole  (PROTONIX ) 40 MG tablet Take 40 mg by mouth daily.   Yes [provider]  sotalol  (BETAPACE ) 160 MG tablet Take 160 mg by mouth 2 (two) times daily.   Yes [provider]  spironolactone  (ALDACTONE ) 25 MG tablet Take 12.5 mg by mouth daily.   Yes [provider]  torsemide  (DEMADEX ) 20 MG tablet Take 20 mg by mouth daily as needed (swelling).   Yes [provider]  valsartan  (DIOVAN ) 80 MG tablet Take 1 tablet (80 mg total) by mouth daily. 06/28/23  Yes [name], [name], PA-C  Continuous Glucose Receiver (FREESTYLE LIBRE 3 READER) DEVI UAD to monitor blood sugar 07/12/23   Vicci Barnie NOVAK, MD  Continuous Glucose Sensor (FREESTYLE LIBRE 3 PLUS SENSOR) MISC Change sensor every 15 days. 07/12/23   Vicci Barnie NOVAK, MD  Fingerstix Lancets MISC Use as directed with testing  supplies up to 4 times daily. 06/28/23   [name], [name], PA-C  glucose blood test strip Use as instructed 06/28/23   [name], [name], PA-C  Lancet Devices (TRUEDRAW LANCING DEVICE) MISC Use as directed up to four times daily 06/28/23   Charlyne Reed, PA-C                                                                                                                                    Past Surgical History Past Surgical History:  Procedure Laterality Date   ABDOMINAL AORTOGRAM W/LOWER EXTREMITY N/A 06/04/2023   Procedure: ABDOMINAL AORTOGRAM W/LOWER EXTREMITY;  Surgeon: Magda Debby SAILOR, MD;  Location: [facility] INVASIVE CV LAB;  Service: Cardiovascular;  Laterality: N/A;   CORONARY ARTERY BYPASS GRAFT     FEMORAL-POPLITEAL BYPASS GRAFT Left 06/24/2023   Procedure: BYPASS GRAFT FEMORAL- ABOVE KNEE POPLITEAL ARTERY;  Surgeon: Magda Debby SAILOR, MD;  Location: [facility] OR;  Service: Vascular;  Laterality: Left;   RIGHT/LEFT HEART CATH AND CORONARY/GRAFT ANGIOGRAPHY N/A 06/15/2023   Procedure: RIGHT/LEFT HEART CATH AND CORONARY/GRAFT ANGIOGRAPHY;  Surgeon: Wonda Sharper, MD;  Location: [facility] INVASIVE CV LAB;  Service: Cardiovascular;  Laterality: N/A;   Family History Family History  Problem Relation Age of Onset   Heart attack Mother    Heart attack Father    Heart attack Maternal Uncle     Social History Social History   Tobacco Use   Smoking status: Former    Current packs/day: 0.00    Average packs/day: 0.5 packs/day for 35.0 years (17.5 ttl pk-yrs)    Types: Cigarettes    Start date: 37    Quit date: 06/30/2023    Years since quitting: 0.1  Vaping Use   Vaping status: Never Used  Substance Use Topics   Alcohol use: No   Drug use: Never   Allergies Victoza [liraglutide]  Review of Systems Review of Systems  Constitutional:  Positive for chills. Negative for fever.  HENT:  Negative for congestion.   Respiratory:  Positive for chest tightness and shortness of breath.    Cardiovascular:  Positive for leg swelling. Negative for chest pain and palpitations.  Gastrointestinal:  Negative for abdominal pain and nausea.  Genitourinary:  Negative for dysuria and flank pain.  Musculoskeletal:  Negative for arthralgias.  Neurological:  Negative for headaches.  All other systems reviewed and are negative.   Physical Exam Vital Signs  I have reviewed the triage vital signs BP 118/77   Pulse 65   Temp 98.5  F (36.9 C) (Oral)   Resp 19   SpO2 100%  Physical Exam Vitals and nursing note reviewed.  Constitutional:      General: He is not in acute distress.    Appearance: He is well-developed. He is obese. He is not ill-appearing.  HENT:     Head: Normocephalic and atraumatic.     Right Ear: External ear normal.     Left Ear: External ear normal.     Mouth/Throat:     Mouth: Mucous membranes are moist.  Eyes:     General: No scleral icterus. Cardiovascular:     Rate and Rhythm: Normal rate and regular rhythm.     Pulses: Normal pulses.     Heart sounds: Normal heart sounds.  Pulmonary:     Effort: Pulmonary effort is normal. No respiratory distress.     Breath sounds: Normal breath sounds.  Abdominal:     General: Abdomen is flat.     Palpations: Abdomen is soft.     Tenderness: There is no abdominal tenderness.  Musculoskeletal:     Cervical back: No rigidity.     Right lower leg: Edema present.     Left lower leg: Edema present.     Comments: 1+ pitting LE below knee   Skin:    General: Skin is warm and dry.     Capillary Refill: Capillary refill takes less than 2 seconds.  Neurological:     Mental Status: He is alert.  Psychiatric:        Mood and Affect: Mood normal.        Behavior: Behavior normal.     ED Results and Treatments Labs (all labs ordered are listed, but only abnormal results are displayed) Labs Reviewed  COMPREHENSIVE METABOLIC PANEL - Abnormal; Notable for the following components:      Result Value   Calcium  8.8  (*)    Albumin 3.4 (*)    Total Bilirubin 1.6 (*)    All other components within normal limits  BRAIN NATRIURETIC PEPTIDE - Abnormal; Notable for the following components:   B Natriuretic Peptide 1,067.0 (*)    All other components within normal limits  CBG MONITORING, ED - Abnormal; Notable for the following components:   Glucose-Capillary 65 (*)    All other components within normal limits  RESP PANEL BY RT-PCR (RSV, FLU A&B, COVID)  RVPGX2  CBC WITH DIFFERENTIAL/PLATELET  CBG MONITORING, ED  TROPONIN I (HIGH SENSITIVITY)  TROPONIN I (HIGH SENSITIVITY)                                                                                                                          Radiology DG Chest 2 View Result Date: 08/06/2023 CLINICAL DATA:  Shortness of breath. EXAM: CHEST - 2 VIEW COMPARISON:  Chest two views 07/24/2023, 07/01/2023, 06/01/2022, 12/21/2021 FINDINGS: Cardiac silhouette is again at the upper limits of normal size. Status post median sternotomy. Left chest wall cardiac AICD is again seen.  Mild left-greater-than-right linear scarring is unchanged from multiple prior radiographs. No new airspace opacity. No pleural effusion or pneumothorax. Mild-to-moderate multilevel degenerative disc changes of the thoracic spine. IMPRESSION: 1. No active cardiopulmonary disease. 2. Mild left-greater-than-right linear scarring is unchanged from multiple prior radiographs. Electronically Signed   By: Tanda Lyons M.D.   On: 08/06/2023 08:21    Pertinent labs & imaging results that were available during my care of the patient were reviewed by me and considered in my medical decision making (see MDM for details).  Medications Ordered in ED Medications  ipratropium-albuterol  (DUONEB) 0.5-2.5 (3) MG/3ML nebulizer solution 3 mL (3 mLs Nebulization Given 08/06/23 1311)  furosemide  (LASIX ) injection 80 mg (80 mg Intravenous Given 08/06/23 1305)                                                                                                                                      Procedures .Critical Care  Performed by: Elnor Jayson LABOR, DO Authorized by: Elnor Jayson LABOR, DO   Critical care provider statement:    Critical care time (minutes):  40   Critical care time was exclusive of:  Separately billable procedures and treating other patients   Critical care was necessary to treat or prevent imminent or life-threatening deterioration of the following conditions:  Cardiac failure   Critical care was time spent personally by me on the following activities:  Development of treatment plan with patient or surrogate, discussions with consultants, evaluation of patient's response to treatment, examination of patient, ordering and review of laboratory studies, ordering and review of radiographic studies, ordering and performing treatments and interventions, pulse oximetry, re-evaluation of patient's condition, review of old charts and obtaining history from patient or surrogate   (including critical care time)  Medical Decision Making / ED Course    Medical Decision Making:    Michael Barnett is a 53 y.o. male with past medical history as below, significant for CHF, DM, hypertension, sleep apnea, ischemic cardiomyopathy with AICD, diastolic heart failure who presents to the ED with complaint of dyspnea/chest tightness, extremity swelling, complaint involves an extensive differential diagnosis and also carries with it a high risk of complications and morbidity.  Serious etiology was considered. Ddx includes but is not limited to: Differential includes all life-threatening causes for chest pain. This includes but is not exclusive to acute coronary syndrome, aortic dissection, pulmonary embolism, cardiac tamponade, community-acquired pneumonia, pericarditis, musculoskeletal chest wall pain, etc. In my evaluation of this patient's dyspnea my DDx includes, but is not limited to, pneumonia, pulmonary embolism,  pneumothorax, pulmonary edema, metabolic acidosis, asthma, COPD, cardiac cause, anemia, anxiety, etc.    Complete initial physical exam performed, notably the patient was in no acute respiratory distress, vital signs stable.    Reviewed and confirmed nursing documentation for past medical history, family history, social history.  Vital signs reviewed.   Clinical Course as of 08/06/23 1450  Fri  Aug 06, 2023  1202 B Natriuretic Peptide(!): 1,067.0 Exertional dyspnea, orthopnea, hx chf, concern acute exacerbation. Will give lasix . Check ambulatory pulse ox  [SG]  1211 Pulse ox 94% with ambulation  [SG]  1305 He has orthopnea and exertional dyspnea but no hypoxia.  Will give Lasix .  Reassess [SG]  1412 Glucose-Capillary(!): 65 Give po [SG]  1432 Feeling much better after lasix , has had around 2L UOP  [SG]    Clinical Course User Index [SG] Elnor Jayson LABOR, DO    Brief summary: 53 year old male history as above including diastolic heart failure, V. tach, cardiomyopathy, AICD here with chest tightness, dyspnea, extremity swelling.  He was admitted 1/25 with presumed V. tach versus AF, he had shock from his AICD at that time, he was continued on sotalol , Eliquis   He is well-appearing on my assessment, chest x-ray without pulmonary edema or acute derangement.  After diuresis his respiratory status has improved.  He is not hypoxic.  He has urinated around 2 L.  Seems stable for discharge at this time, follow-up with cardiology next week.  Heart failure fluid restrictions discussed with the patient, continue diuretics.     The patient improved significantly and was discharged in stable condition. Detailed discussions were had with the patient/guardian regarding current findings, and need for close f/u with PCP or on call doctor. The patient/guardian has been instructed to return immediately if the symptoms worsen in any way for re-evaluation. Patient/guardian verbalized understanding and is in  agreement with current care plan. All questions answered prior to discharge.          Additional history obtained: -Additional history obtained from family -External records from outside source obtained and reviewed including: Chart review including previous notes, labs, imaging, consultation notes including  Prior echocardiogram, medications, prior admission   Lab Tests: -I ordered, reviewed, and interpreted labs.   The pertinent results include:   Labs Reviewed  COMPREHENSIVE METABOLIC PANEL - Abnormal; Notable for the following components:      Result Value   Calcium  8.8 (*)    Albumin 3.4 (*)    Total Bilirubin 1.6 (*)    All other components within normal limits  BRAIN NATRIURETIC PEPTIDE - Abnormal; Notable for the following components:   B Natriuretic Peptide 1,067.0 (*)    All other components within normal limits  CBG MONITORING, ED - Abnormal; Notable for the following components:   Glucose-Capillary 65 (*)    All other components within normal limits  RESP PANEL BY RT-PCR (RSV, FLU A&B, COVID)  RVPGX2  CBC WITH DIFFERENTIAL/PLATELET  CBG MONITORING, ED  TROPONIN I (HIGH SENSITIVITY)  TROPONIN I (HIGH SENSITIVITY)    Notable for BNP +   EKG   EKG Interpretation Date/Time:  Friday August 06 2023 07:56:25 EST Ventricular Rate:  68 PR Interval:  150 QRS Duration:  114 QT Interval:  412 QTC Calculation: 438 R Axis:   63  Text Interpretation: Normal sinus rhythm Incomplete left bundle branch block T wave abnormality, consider inferior ischemia Abnormal ECG When compared with ECG of 25-Jul-2023 10:06, PREVIOUS ECG IS PRESENT similar to prior no stemi Confirmed by Elnor Jayson ([id number]) on 08/06/2023 8:22:54 AM         Imaging Studies ordered: I ordered imaging studies including CXR I independently visualized the following imaging with scope of interpretation limited to determining acute life threatening conditions related to emergency care; findings noted  above I independently visualized and interpreted imaging. I agree with the radiologist interpretation  Medicines ordered and prescription drug management: Meds ordered this encounter  Medications   DISCONTD: furosemide  (LASIX ) injection 40 mg   ipratropium-albuterol  (DUONEB) 0.5-2.5 (3) MG/3ML nebulizer solution 3 mL   furosemide  (LASIX ) injection 80 mg   DISCONTD: furosemide  (LASIX ) 10 MG/ML injection    Applegate, Christoph: cabinet override   albuterol  (VENTOLIN  HFA) 108 (90 Base) MCG/ACT inhaler    Sig: Inhale 1-2 puffs into the lungs every 6 (six) hours as needed for wheezing or shortness of breath.    Dispense:  1 each    Refill:  0    -I have reviewed the patients home medicines and have made adjustments as needed   Consultations Obtained: na   Cardiac Monitoring: The patient was maintained on a cardiac monitor.  I personally viewed and interpreted the cardiac monitored which showed an underlying rhythm of: NSR Continuous pulse oximetry interpreted by myself, 97% on RA.    Social Determinants of Health:  Diagnosis or treatment significantly limited by social determinants of health: former smoker and obesity   Reevaluation: After the interventions noted above, I reevaluated the patient and found that they have improved  Co morbidities that complicate the patient evaluation  Past Medical History:  Diagnosis Date   AICD (automatic cardioverter/defibrillator) present    Arthritis    CHF (congestive heart failure) (HCC)    Coronary artery disease    Defibrillator activation    Diabetes mellitus    Hypertension    Myocardial infarction [facility])    Sleep apnea    Stroke [facility])       Dispostion: Disposition decision including need for hospitalization was considered, and patient discharged from emergency department.    Final Clinical Impression(s) / ED Diagnoses Final diagnoses:  Acute on chronic heart failure, unspecified heart failure type (HCC)         Elnor Jayson LABOR, DO 08/06/23 1450

## 2023-08-11 ENCOUNTER — Encounter: Payer: Self-pay | Admitting: Physician Assistant

## 2023-08-11 ENCOUNTER — Ambulatory Visit: Payer: Medicaid Other | Attending: Physician Assistant | Admitting: Physician Assistant

## 2023-08-11 VITALS — BP 128/84 | HR 62 | Wt 229.0 lb

## 2023-08-11 DIAGNOSIS — Z91199 Patient's noncompliance with other medical treatment and regimen due to unspecified reason: Secondary | ICD-10-CM

## 2023-08-11 DIAGNOSIS — I2510 Atherosclerotic heart disease of native coronary artery without angina pectoris: Secondary | ICD-10-CM

## 2023-08-11 DIAGNOSIS — R600 Localized edema: Secondary | ICD-10-CM | POA: Diagnosis not present

## 2023-08-11 DIAGNOSIS — I472 Ventricular tachycardia, unspecified: Secondary | ICD-10-CM

## 2023-08-11 DIAGNOSIS — Z7984 Long term (current) use of oral hypoglycemic drugs: Secondary | ICD-10-CM

## 2023-08-11 DIAGNOSIS — E1165 Type 2 diabetes mellitus with hyperglycemia: Secondary | ICD-10-CM | POA: Diagnosis not present

## 2023-08-11 DIAGNOSIS — Z9189 Other specified personal risk factors, not elsewhere classified: Secondary | ICD-10-CM

## 2023-08-11 DIAGNOSIS — Z09 Encounter for follow-up examination after completed treatment for conditions other than malignant neoplasm: Secondary | ICD-10-CM

## 2023-08-11 DIAGNOSIS — Z794 Long term (current) use of insulin: Secondary | ICD-10-CM

## 2023-08-11 LAB — GLUCOSE, POCT (MANUAL RESULT ENTRY): POC Glucose: 162 mg/dL — AB (ref 70–99)

## 2023-08-11 MED ORDER — PANTOPRAZOLE SODIUM 40 MG PO TBEC
40.0000 mg | DELAYED_RELEASE_TABLET | Freq: Every day | ORAL | 3 refills | Status: AC
Start: 2023-08-11 — End: ?
  Filled 2023-10-01 – 2023-11-29 (×2): qty 30, 30d supply, fill #0

## 2023-08-11 NOTE — Patient Instructions (Signed)
Heart Failure Action Plan A heart failure action plan helps you know what to do when you have symptoms of heart failure. Your action plan is a color-coded plan that lists the symptoms to watch for and indicates what actions to take. If you have symptoms in the green zone, you're doing well. If you have symptoms in the yellow zone, you're having problems. If you have symptoms in the red zone, you need medical care right away. Follow the plan that was created by you and your health care provider. Review your plan each time you visit your provider. Green zone These signs mean you're doing well and can continue what you're doing: You don't have new or worsening shortness of breath. You have very little swelling or no new swelling. Your weight is stable (no gain or loss). You have a normal activity level. You don't have chest pain or any other new symptoms. Yellow zone These signs and symptoms mean your condition may be getting worse and you should make some changes: You have trouble breathing when you're active. You have swelling in your feet or legs or have discomfort in your belly. You gain 2-3 lb (0.9-1.4 kg) in 24 hours, or 5 lb (2.3 kg) in a week. This amount may be more or less depending on your condition. You get tired easily. You have trouble sleeping. You have a dry cough. If you have any of these symptoms: Contact your provider within the next day. Your provider may adjust your medicines. Red zone These signs and symptoms mean you should get medical help right away: You have trouble breathing when resting or cannot lie flat and you need to raise your head to help you breathe. You have a dry cough that's getting worse. You have swelling or pain in your feet or legs or discomfort in your belly that's getting worse. You suddenly gain more than 2-3 lb (0.9-1.4 kg) in 24 hours, or more than 5 lb (2.3 kg) in a week. This amount may be more or less depending on your condition. You have  trouble staying awake or you feel confused. You don't have an appetite. You have worsening sadness or depression. These symptoms may be an emergency. Call 911 right away. Do not wait to see if the symptoms will go away. Do not drive yourself to the hospital. Follow these instructions at home: Take medicines only as told. Eat a heart-healthy diet. Work with a dietitian to create an eating plan that's best for you. Weigh yourself each day. Your target weight is __________ lb (__________ kg). Call your provider if you gain more than __________ lb (__________ kg) in 24 hours, or more than __________ lb (__________ kg) in a week. Health care provider name: _____________________________________________________ Health care provider phone number: _____________________________________________________ Where to find more information American Heart Association: heart.org This information is not intended to replace advice given to you by your health care provider. Make sure you discuss any questions you have with your health care provider. Document Revised: 01/28/2023 Document Reviewed: 01/28/2023 Elsevier Patient Education  2024 [employer].

## 2023-08-11 NOTE — Progress Notes (Signed)
Patient ID: Michael Barnett, male   DOB: 04/10/1971, 53 y.o.   MRN: [id number]     Michael Barnett, is a 53 y.o. male  QMV:[id number]  MWU:[id number]  DOB - [birth year]  Chief Complaint  Patient presents with   Hospitalization Follow-up   Diabetes       Subjective:   Michael Barnett is a 53 y.o. male here today for a follow up visit Hospitalized 1/25-1/26/2025 with Michael Barnett.  Seen by vascular and vein 07/27/2023.  Then seen in ED 2/7 with CP DOE and extremity swelling.  He can not tell me what meds he is or is not taking.  It does not sound as though he has been taking torsemide.  BNP >1000 when last checked.  He is not weighing at all.  Today he is complaining of leg swelling.  He is wanting to get an in home-aid.  Steffanie Rainwater is a CNA and he would like it to be her.  He is able to do most ADL on his own.  He denies further CP and no further shocks.  From hospitalization discharge summary 1/25-1/26:  Principal Problem:   VT (ventricular tachycardia) (HCC) Active Problems:   Ischemic cardiomyopathy with implantable cardioverter-defibrillator (ICD)   PVC (premature ventricular contraction)   Paroxysmal atrial fibrillation (HCC)   CAD (coronary artery disease)   Hypertension   Type 2 diabetes mellitus (HCC)   History of CVA (cerebrovascular accident)  Patient was admitted to observation on 07/24/2023 to monitor him on telemetry and ensure there were no additional episodes of VT. Upon review of the interrogation report from [facility] it showed a highly irregular rhythm. Before his shock that was administered prior to admission he was symptomatic feeling palpitations and dizziness, which was different than prior shock prodromes. Per MD, suspects that the patient likely had an inappropriate shock for atrial fibrillation with RVR rather than true VT. Patient does have a history of VT and has frequent PVCs.    He was continuined on his sotalol 160 mg BID for his history of VT. Started on  Lopressor 25 mg BID for rate control. Continued on his Jardiance 25 mg daily, Lipitor 80 mg daily, irbesartan 75 mg daily (replacing PTA valsartan), spironolactone 12.5 mg daily, and ASA 81 mg daily. Patient was admitted for observation to monitor on telemetry.    This morning, patient reports feeling better. He was seen by [employer] who made some program adjustments and patient was deemed medically stable for discharge. Due to patient having suspected atrial fibrillation will start patient on Eliquis 5 mg BID.    Close follow up made with EP outpatient  From vascular and vein visit 07/27/2023: ASSESSMENT / PLAN: Michael Barnett is a 53 y.o. male status post left femoral - above knee popliteal artery bypass 06/23/24 for ulceration.   Recommend:  Abstinence from all tobacco products. Blood glucose control with goal A1c < 7%. Blood pressure control with goal blood pressure < 140/90 mmHg. Lipid reduction therapy with goal LDL-C <70   Aspirin 81mg  PO QD.  Atorvastatin 40-80mg  PO QD (or other "high intensity" statin therapy).   His left foot has healed. His bypass is patent by duplex and clinically. His L ABI has not improved, despite a palpable pedal pulse. Will monitor him with repeat duplex in 3 months.   CHIEF COMPLAINT: left great toe ulcer   HISTORY OF PRESENT ILLNESS: Michael Barnett is a 53 y.o. male referred to clinic for evaluation of left great toe ulcer by  Dr. Ralene Cork.  The patient has a strong personal history of atherosclerotic disease.  He had coronary artery bypass grafting in [place] several years ago.  He is a diabetic with poorly controlled diabetes.  He reports a left great toe injury several months ago with persistent pain.  He was noted to have a small ulcer on his left great toe by Dr. Ralene Cork during her evaluation about a week ago, and referred for further management.   07/27/23: doing well overall. Still has some discomfort about his left knee. Incisions  have healed. Left foot is better. Walking is better. Left foot has healed.  From ED 08/06/2023 Michael Barnett is a 53 y.o. male with past medical history as below, significant for CHF, DM, hypertension, sleep apnea, ischemic cardiomyopathy with AICD, diastolic heart failure who presents to the ED with complaint of dyspnea/chest tightness, extremity swelling,    Symptoms ongoing around 1 wk, noticed slight increase swelling to his lower extremities over the past week.  He has orthopnea, exertional dyspnea.  Slight chest tightness over the past few days.  No fevers but did report some chills over the last few days.  No abdominal pain nausea or vomiting, change in bowel or bladder function, compliant with home medications.  No recent travel or sick contacts.  Medical Decision Making:     Michael Barnett is a 53 y.o. male with past medical history as below, significant for CHF, DM, hypertension, sleep apnea, ischemic cardiomyopathy with AICD, diastolic heart failure who presents to the ED with complaint of dyspnea/chest tightness, extremity swelling, complaint involves an extensive differential diagnosis and also carries with it a high risk of complications and morbidity.  Serious etiology was considered. Ddx includes but is not limited to: Differential includes all life-threatening causes for chest pain. This includes but is not exclusive to acute coronary syndrome, aortic dissection, pulmonary embolism, cardiac tamponade, community-acquired pneumonia, pericarditis, musculoskeletal chest wall pain, etc. In my evaluation of this patient's dyspnea my DDx includes, but is not limited to, pneumonia, pulmonary embolism, pneumothorax, pulmonary edema, metabolic acidosis, asthma, COPD, cardiac cause, anemia, anxiety, etc.   Medical Decision Making:     Michael Barnett is a 53 y.o. male with past medical history as below, significant for CHF, DM, hypertension, sleep apnea, ischemic cardiomyopathy with AICD,  diastolic heart failure who presents to the ED with complaint of dyspnea/chest tightness, extremity swelling, complaint involves an extensive differential diagnosis and also carries with it a high risk of complications and morbidity.  Serious etiology was considered. Ddx includes but is not limited to: Differential includes all life-threatening causes for chest pain. This includes but is not exclusive to acute coronary syndrome, aortic dissection, pulmonary embolism, cardiac tamponade, community-acquired pneumonia, pericarditis, musculoskeletal chest wall pain, etc. In my evaluation of this patient's dyspnea my DDx includes, but is not limited to, pneumonia, pulmonary embolism, pneumothorax, pulmonary edema, metabolic acidosis, asthma, COPD, cardiac cause, anemia, anxiety, etc.    No problems updated.  ALLERGIES: Allergies  Allergen Reactions   Victoza [Liraglutide] Other (See Comments)    Gave pt pancreatitis    PAST MEDICAL HISTORY: Past Medical History:  Diagnosis Date   AICD (automatic cardioverter/defibrillator) present    Arthritis    CHF (congestive heart failure) (HCC)    Coronary artery disease    Defibrillator activation    Diabetes mellitus    Hypertension    Myocardial infarction [facility])    Sleep apnea    Stroke [facility])     MEDICATIONS AT HOME: Prior  to Admission medications   Medication Sig Start Date End Date Taking? Authorizing Provider  albuterol (VENTOLIN HFA) 108 (90 Base) MCG/ACT inhaler Inhale 1-2 puffs into the lungs every 6 (six) hours as needed for wheezing or shortness of breath. 08/06/23  Yes [name], DO  apixaban (ELIQUIS) 5 MG TABS tablet Take 1 tablet (5 mg total) by mouth 2 (two) times daily. 07/25/23  Yes Parcells, Therisa Doyne, PA-C  aspirin 81 MG chewable tablet Chew 81 mg by mouth daily.   Yes [provider]  atorvastatin (LIPITOR) 80 MG tablet Take 80 mg by mouth daily.   Yes [provider]  Continuous Glucose Receiver (FREESTYLE  LIBRE 3 READER) DEVI UAD to monitor blood sugar 07/12/23  Yes [name], MD  Continuous Glucose Sensor (FREESTYLE LIBRE 3 PLUS SENSOR) MISC Change sensor every 15 days. 07/12/23  Yes [name], MD  empagliflozin (JARDIANCE) 25 MG TABS tablet Take 25 mg by mouth daily.   Yes [provider]  Fingerstix Lancets MISC Use as directed with testing supplies up to 4 times daily. 06/28/23  Yes [name], [name], PA-C  glucose blood test strip Use as instructed 06/28/23  Yes [name], [name], PA-C  insulin glargine (LANTUS) 100 unit/mL SOPN Inject 15 Units into the skin at bedtime. 07/12/23  Yes [name], MD  Lancet Devices (TRUEDRAW LANCING DEVICE) MISC Use as directed up to four times daily 06/28/23  Yes [name], [name], PA-C  metoprolol tartrate (LOPRESSOR) 25 MG tablet Take 1 tablet (25 mg total) by mouth 2 (two) times daily. 07/25/23  Yes Parcells, Therisa Doyne, PA-C  nitroGLYCERIN (NITROSTAT) 0.4 MG SL tablet Place 0.4 mg under the tongue every 5 (five) minutes as needed for chest pain.   Yes [provider]  oxyCODONE-acetaminophen (PERCOCET) 10-325 MG tablet Take 1 tablet by mouth every 4 (four) hours as needed for pain.   Yes [provider]  sotalol (BETAPACE) 160 MG tablet Take 160 mg by mouth 2 (two) times daily.   Yes [provider]  spironolactone (ALDACTONE) 25 MG tablet Take 12.5 mg by mouth daily.   Yes [provider]  torsemide (DEMADEX) 20 MG tablet Take 20 mg by mouth daily as needed (swelling).   Yes [provider]  valsartan (DIOVAN) 80 MG tablet Take 1 tablet (80 mg total) by mouth daily. 06/28/23  Yes [name], [name], PA-C  pantoprazole (PROTONIX) 40 MG tablet Take 1 tablet (40 mg total) by mouth daily. 08/11/23   Cyntha Brickman, Marzella Schlein, PA-C    ROS: Neg HEENT Neg GI Neg GU Neg MS Neg psych Neg neuro  Objective:   Vitals:   08/11/23 1332 08/11/23 1336  BP:  128/84  Pulse:  62  SpO2:  99%  Weight: 229 lb  (103.9 kg)    Exam General appearance : Awake, alert, not in any distress. Speech Clear. Not toxic looking HEENT: Atraumatic and Normocephalic Neck: Supple, no JVD. No cervical lymphadenopathy.  Chest: Good air entry bilaterally, CTAB.  No rales/rhonchi/wheezing CVS: S1 S2 regular, no murmurs.  Abdomen: Bowel sounds present, Non tender and not distended with no gaurding, rigidity or rebound. Extremities: B/L Lower Ext shows L leg with 1+ edema, R leg minimal to no edema both legs are warm to touch Neurology: Awake alert, and oriented X 3, CN II-XII intact, Non focal Skin: No Rash  Data Review Lab Results  Component Value Date   HGBA1C 9.1 (H) 06/24/2023    Assessment & Plan  1. Type 2 diabetes mellitus with hyperglycemia, with long-term current use of insulin (HCC) (Primary) Not at goal nbut improving - Glucose (CBG)  2. Long term (current) use of insulin (HCC)  3. Long term current use of oral hypoglycemic drug  4. VT (ventricular tachycardia) (HCC) Resolved-has f/up cardiology (see hospital discharge summary for instructions)  5. Hospital discharge follow-up  6. Coronary artery disease involving native coronary artery of native heart without angina pectoris Followed by cardiology  7. Edema of left lower leg TAKE torsemide and do daily weights  8. Noncompliance Not weighing or following medications from discharge.  Did not bring medications with him.  Urged him to take all meds to cardiology appt.  Reviewed most recent labs  9. High risk for readmission    Return in about 2 months (around 10/09/2023) for PCP for chronic conditions-Dr Laural Benes.  The patient was given clear instructions to go to ER or return to medical center if symptoms don't improve, worsen or new problems develop. The patient verbalized understanding. The patient was told to call to get lab results if they haven't heard anything in the next week.      Georgian Co, PA-C [facility] [place], [place] [phone]   08/11/2023, 2:10 PM

## 2023-08-12 ENCOUNTER — Other Ambulatory Visit: Payer: Self-pay

## 2023-08-12 ENCOUNTER — Telehealth: Payer: Self-pay

## 2023-08-12 DIAGNOSIS — I70245 Atherosclerosis of native arteries of left leg with ulceration of other part of foot: Secondary | ICD-10-CM

## 2023-08-12 NOTE — Telephone Encounter (Signed)
Signed PCS request efaxed to [place] [employer]

## 2023-08-12 NOTE — Progress Notes (Unsigned)
Electrophysiology Office Note:   Date:  08/13/2023  ID:  Michael Barnett, DOB 1971/02/26, MRN [id number]  Primary Cardiologist: Orbie Pyo, MD Primary Heart Failure: None Electrophysiologist: Maurice Small, MD       History of Present Illness:   Michael Barnett is a 53 y.o. male with h/o VT s/p ablation 10/2022, dilated ischemic cardiomyopathy s/p ICD, AF on sotolol, CAD s/p CABG 2013, PCI to RCA 2016, MI, HTN, CVA, DM II, OSA seen today for post hospital follow up.    Admitted 1/25-1/26/25 in the setting of ICD shock.  The patients device was interrogated and rhythm was atrial fibrillation with RVR / inappropriate shock. Shock was delivered due to sustained rate duration criterion being met.    Since discharge from hospital the patient reports doing well. No further shocks per ICD.  He brings his medications in with him today.  Med list confirmed.  He is on PRN torsemide and does not regularly take it as he does not like having to go to the bathroom so much. He denies chest pain, palpitations, dyspnea, PND, orthopnea, nausea, vomiting, dizziness, syncope, edema, weight gain, or early satiety.   Review of systems complete and found to be negative unless listed in HPI.   EP Information / Studies Reviewed:    EKG is ordered today. Personal review as below.  EKG Interpretation Date/Time:  Friday August 13 2023 09:35:44 EST Ventricular Rate:  62 PR Interval:  158 QRS Duration:  90 QT Interval:  440 QTC Calculation: 446 R Axis:   115  Text Interpretation: Normal sinus rhythm Left posterior fascicular block Confirmed by Canary Brim ([id number]) on 08/13/2023 9:43:08 AM   ICD Interrogation-  reviewed in detail today,  See PACEART report.  Device History: Magazine features editor ICD implanted 05/26/2011 for monomorphic VT History of appropriate therapy: No, inappropriate for atrial arrhythmia 06/2023 History of AAD therapy: Yes; currently on sotalol    Studies:  EPS  11/24/2022 > VT ablation  ECHO 05/2023 > LVEF 45-50%, GIII DD, LA mod dilated, RA mildly dilated  [facility] 05/2023 > severe native vessel CAD with severe distal left main stenosis, total occlusion of the proximal circumflex, severe proximal LAD stenosis and severe diffuse RCA stenosis, s/p CABG with chronic occlusion of the SVG to OM & wide patency of the LIMA to LAD   Arrhythmia / AAD Monomorphic VT s/p ICD 2012 AF  Sotalol > initiated 10/2022 post VT ablation    Risk Assessment/Calculations:    CHA2DS2-VASc Score = 6   This indicates a 9.7% annual risk of stroke. The patient's score is based upon: CHF History: 1 HTN History: 1 Diabetes History: 1 Stroke History: 2 Vascular Disease History: 1 Age Score: 0 Gender Score: 0             Physical Exam:   VS:  BP 110/70   Pulse 60   Ht 5\' 9"  (1.753 m)   Wt 229 lb (103.9 kg)   SpO2 97%   BMI 33.82 kg/m    Wt Readings from Last 3 Encounters:  08/13/23 229 lb (103.9 kg)  08/11/23 229 lb (103.9 kg)  07/27/23 222 lb (100.7 kg)     GEN: Well nourished, well developed in no acute distress NECK: No JVD; No carotid bruits CARDIAC: Regular rate and rhythm, no murmurs, rubs, gallops RESPIRATORY:  Clear to auscultation without rales, wheezing or rhonchi  ABDOMEN: Soft, non-tender, non-distended EXTREMITIES:  No edema; No deformity   ASSESSMENT AND PLAN:  Chronic Systolic Dysfunction s/p [employer] single chamber ICD  VT  ICM -euvolemic on exam -Stable on an appropriate medical regimen -Normal ICD function -pt will be [facility] to [facility] per [facility] > see scanned report of device check from 08/13/23, appropriate impedance, sensing and threshold, no further VT events -No changes today -arranged for Cardiology follow up, refills given of expired meds -recent labs from 2/7 reviewed   PVC's  -metoprolol added during hospital admit   Paroxysmal Atrial Fibrillation  High Risk Drug Monitoring:  Sotalol -on sotalol at baseline  -EKG with NSR & stable intervals  -metoprolol   Secondary Hypercoagulable State  -continue Eliquis 5mg  BID, dose reviewed and appropriate by age/wt   Vascular Disease  Fem-pop / above the knee in 05/2023  -per VVS  Disposition:   Follow up with Dr. Nelly Laurence in 6 months   Signed, Canary Brim, NP-C, AGACNP-BC [facility] - Electrophysiology  08/13/2023, 1:25 PM

## 2023-08-13 ENCOUNTER — Encounter: Payer: Self-pay | Admitting: Pulmonary Disease

## 2023-08-13 ENCOUNTER — Ambulatory Visit: Payer: Medicaid Other | Attending: Student | Admitting: Pulmonary Disease

## 2023-08-13 VITALS — BP 110/70 | HR 60 | Ht 69.0 in | Wt 229.0 lb

## 2023-08-13 DIAGNOSIS — I480 Paroxysmal atrial fibrillation: Secondary | ICD-10-CM | POA: Diagnosis not present

## 2023-08-13 DIAGNOSIS — Z95810 Presence of automatic (implantable) cardiac defibrillator: Secondary | ICD-10-CM

## 2023-08-13 DIAGNOSIS — I255 Ischemic cardiomyopathy: Secondary | ICD-10-CM

## 2023-08-13 DIAGNOSIS — Z79899 Other long term (current) drug therapy: Secondary | ICD-10-CM

## 2023-08-13 MED ORDER — METOPROLOL TARTRATE 25 MG PO TABS: 25.0000 mg | ORAL_TABLET | Freq: Two times a day (BID) | ORAL | 3 refills | Status: DC

## 2023-08-13 MED ORDER — ASPIRIN 81 MG PO CHEW
81.0000 mg | CHEWABLE_TABLET | Freq: Every day | ORAL | 3 refills | Status: DC
  Filled 2023-10-01: qty 90, 90d supply, fill #0

## 2023-08-13 MED ORDER — SOTALOL HCL 160 MG PO TABS
160.0000 mg | ORAL_TABLET | Freq: Two times a day (BID) | ORAL | 3 refills | Status: AC
  Filled 2023-10-01: qty 180, 90d supply, fill #0

## 2023-08-13 MED ORDER — ATORVASTATIN CALCIUM 80 MG PO TABS: 80.0000 mg | ORAL_TABLET | Freq: Every day | ORAL | 3 refills | Status: AC

## 2023-08-13 MED ORDER — TORSEMIDE 20 MG PO TABS: 20.0000 mg | ORAL_TABLET | Freq: Every day | ORAL | 1 refills | Status: DC | PRN

## 2023-08-13 NOTE — Patient Instructions (Signed)
Medication Instructions:   Your physician recommends that you continue on your current medications as directed. Please refer to the Current Medication list given to you today.  . *If you need a refill on your cardiac medications before your next appointment, please call your pharmacy*    NONE ORDERED  TODAY    If you have labs (blood work) drawn today and your tests are completely normal, you will receive your results only by: MyChart Message (if you have MyChart) OR A paper copy in the mail If you have any lab test that is abnormal or we need to change your treatment, we will call you to review the results.   Testing/Procedures:  NONE ORDERED  TODAY      Follow-Up: At [facility], you and your health needs are our priority.  As part of our continuing mission to provide you with exceptional heart care, we have created designated Provider Care Teams.  These Care Teams include your primary Cardiologist (physician) and Advanced Practice Providers (APPs -  Physician Assistants and Nurse Practitioners) who all work together to provide you with the care you need, when you need it.  We recommend signing up for the patient portal called "MyChart".  Sign up information is provided on this After Visit Summary.  MyChart is used to connect with patients for Virtual Visits (Telemedicine).  Patients are able to view lab/test results, encounter notes, upcoming appointments, etc.  Non-urgent messages can be sent to your provider as well.   To learn more about what you can do with MyChart, go to [email or web address].    Your next appointment:  NEW PATIENT  ( NEXT AVAILABLE CARDIOLOGIST)  6 month(s)   Provider:    You may see Dr Nelly Laurence   Other Instructions

## 2023-08-18 NOTE — Telephone Encounter (Signed)
 Signed PCS request also efaxed to [facility]: [phone]

## 2023-08-19 NOTE — Telephone Encounter (Signed)
 Message received from [facility]. She noted that they received the [facility] request but the member is now living in [place] which is outside of the [facility] service area.  She stated that the patient has been advised to contact DSS of his change in address and to choose a health plan that covers his area.     I called and spoke to the patient and explained the message that I received. He confirmed that he was contacted about this and understands that he will need to change his address with DSS and chose a new health plan.  I instructed him to contact this clinic when he has a new health plan that covers this area an we can then proceed with re-submitting a PCS request and he state he understood

## 2023-08-26 ENCOUNTER — Ambulatory Visit: Payer: Medicaid Other | Attending: Internal Medicine | Admitting: Internal Medicine

## 2023-08-26 DIAGNOSIS — Z794 Long term (current) use of insulin: Secondary | ICD-10-CM | POA: Diagnosis not present

## 2023-08-26 DIAGNOSIS — Z7984 Long term (current) use of oral hypoglycemic drugs: Secondary | ICD-10-CM

## 2023-08-26 DIAGNOSIS — R600 Localized edema: Secondary | ICD-10-CM

## 2023-08-26 DIAGNOSIS — I480 Paroxysmal atrial fibrillation: Secondary | ICD-10-CM

## 2023-08-26 DIAGNOSIS — J4540 Moderate persistent asthma, uncomplicated: Secondary | ICD-10-CM

## 2023-08-26 DIAGNOSIS — Z87891 Personal history of nicotine dependence: Secondary | ICD-10-CM

## 2023-08-26 DIAGNOSIS — E119 Type 2 diabetes mellitus without complications: Secondary | ICD-10-CM

## 2023-08-26 DIAGNOSIS — E1165 Type 2 diabetes mellitus with hyperglycemia: Secondary | ICD-10-CM | POA: Diagnosis not present

## 2023-08-26 DIAGNOSIS — Z1211 Encounter for screening for malignant neoplasm of colon: Secondary | ICD-10-CM

## 2023-08-26 LAB — GLUCOSE, POCT (MANUAL RESULT ENTRY): POC Glucose: 141 mg/dL — AB (ref 70–99)

## 2023-08-26 MED ORDER — ALBUTEROL SULFATE (2.5 MG/3ML) 0.083% IN NEBU: 2.5000 mg | INHALATION_SOLUTION | Freq: Four times a day (QID) | RESPIRATORY_TRACT | 1 refills | Status: AC | PRN

## 2023-08-26 MED ORDER — BUDESONIDE-FORMOTEROL FUMARATE 80-4.5 MCG/ACT IN AERO: 2.0000 | INHALATION_SPRAY | Freq: Two times a day (BID) | RESPIRATORY_TRACT | 5 refills | Status: AC

## 2023-08-26 NOTE — Progress Notes (Signed)
 Patient ID: Michael Barnett, male    DOB: 11-Mar-1971  MRN: [id number]  CC: Hypertension (HTN & DM f/u. Sima Matas, cough X2 mo /No to all vax.Yes to colonoscopy referral)   Subjective: Michael Barnett is a 53 y.o. male who presents for chronic ds management. Michael Barnett, his fiance,is with him. His concerns today include:  Patient with history of PAD, CAD status post CABG x 2/ICM/ICD, aortic atherosclerosis, CHF (EF 45 to 50%,GD III DD), DM type 2, HTN, OSA, CVA (pt does not recall date), tobacco dependence   Discussed the use of AI scribe software for clinical note transcription with the patient, who gave verbal consent to proceed.  History of Present Illness   The patient, with a history of diabetes and a recent hospitalization for atrial fibrillation, presents for a follow-up visit. He was hospitalized after his defibrillator fired, which was determined to be due to an episode of atrial fibrillation. Since then, he has been started on sotalol 160mg  twice daily and metoprolol 25mg  twice daily by his cardiologist, which he is taking as prescribed. He was also started on Eliquis 5mg  twice daily for anticoagulation, and he denies any bruising or bleeding. He denies any palpitations or racing heart since starting these medications.  In terms of his diabetes, he is taking Jardiance 25mg  and Lantus insulin 15 units daily as prescribed. He is monitoring his blood sugars with a Freestyle Libre continuous glucose monitor, which he forgot to bring to the appointment. He reports his blood sugars range from 130s to 170s during the day, and he denies any hypoglycemic symptoms. He is trying to maintain a healthy diet, limiting his intake of starches and sugary foods.  He also reports a history of asthma "bad" when he was younger and recent wheezing for the past two months, which is worse at night. He has been using an albuterol inhaler three to four times a day for symptom relief, but reports it is not helping.  He also reports a dry cough and denies any shortness of breath. He quit smoking three weeks ago and denies any cravings.  He is requesting a nebulizer device.  States this is what he used when he had asthma growing up and worked well for him.  He also reports persistent swelling in his LT leg, which is worse at the end of the day. He had a bypass surgery on this leg 05/2023.  Doppler ultrasound was done the middle of last month and it was negative for DVT.  He has seen the vascular specialist and was told that he can wear compression stocking on this leg to help decrease the swelling.       Patient Active Problem List   Diagnosis Date Noted   Ischemic cardiomyopathy with implantable cardioverter-defibrillator (ICD) 07/25/2023   PVC (premature ventricular contraction) 07/25/2023   Paroxysmal atrial fibrillation (HCC) 07/25/2023   CAD (coronary artery disease) 07/25/2023   Hypertension 07/25/2023   Type 2 diabetes mellitus (HCC) 07/25/2023   History of CVA (cerebrovascular accident) 07/25/2023   VT (ventricular tachycardia) (HCC) 07/24/2023   PAD (peripheral artery disease) (HCC) 06/24/2023     Current Outpatient Medications on File Prior to Visit  Medication Sig Dispense Refill   albuterol (VENTOLIN HFA) 108 (90 Base) MCG/ACT inhaler Inhale 1-2 puffs into the lungs every 6 (six) hours as needed for wheezing or shortness of breath. 1 each 0   apixaban (ELIQUIS) 5 MG TABS tablet Take 1 tablet (5 mg total) by mouth 2 (two) times daily.  180 tablet 3   aspirin 81 MG chewable tablet Chew 1 tablet (81 mg total) by mouth daily. 90 tablet 3   atorvastatin (LIPITOR) 80 MG tablet Take 1 tablet (80 mg total) by mouth daily. 90 tablet 3   Continuous Glucose Receiver (FREESTYLE LIBRE 3 READER) DEVI UAD to monitor blood sugar 1 each 0   Continuous Glucose Sensor (FREESTYLE LIBRE 3 PLUS SENSOR) MISC Change sensor every 15 days. 2 each 6   empagliflozin (JARDIANCE) 25 MG TABS tablet Take 25 mg by mouth  daily.     Fingerstix Lancets MISC Use as directed with testing supplies up to 4 times daily. 100 each 0   glucose blood test strip Use as instructed 100 each 12   insulin glargine (LANTUS) 100 unit/mL SOPN Inject 15 Units into the skin at bedtime.     Lancet Devices (TRUEDRAW LANCING DEVICE) MISC Use as directed up to four times daily 1 each 0   metoprolol tartrate (LOPRESSOR) 25 MG tablet Take 1 tablet (25 mg total) by mouth 2 (two) times daily. 180 tablet 3   nitroGLYCERIN (NITROSTAT) 0.4 MG SL tablet Place 0.4 mg under the tongue every 5 (five) minutes as needed for chest pain.     oxyCODONE-acetaminophen (PERCOCET) 10-325 MG tablet Take 1 tablet by mouth every 4 (four) hours as needed for pain.     pantoprazole (PROTONIX) 40 MG tablet Take 1 tablet (40 mg total) by mouth daily. 30 tablet 3   sotalol (BETAPACE) 160 MG tablet Take 1 tablet (160 mg total) by mouth 2 (two) times daily. 180 tablet 3   torsemide (DEMADEX) 20 MG tablet Take 1 tablet (20 mg total) by mouth daily as needed (swelling). 90 tablet 1   valsartan (DIOVAN) 80 MG tablet Take 1 tablet (80 mg total) by mouth daily. 30 tablet 3   No current facility-administered medications on file prior to visit.    Allergies  Allergen Reactions   Victoza [Liraglutide] Other (See Comments)    Gave pt pancreatitis    Social History   Socioeconomic History   Marital status: Single    Spouse name: Not on file   Number of children: Not on file   Years of education: Not on file   Highest education level: Not on file  Occupational History   Not on file  Tobacco Use   Smoking status: Former    Current packs/day: 0.00    Average packs/day: 0.5 packs/day for 35.0 years (17.5 ttl pk-yrs)    Types: Cigarettes    Start date: 27    Quit date: 06/30/2023    Years since quitting: 0.1   Smokeless tobacco: Not on file  Vaping Use   Vaping status: Never Used  Substance and Sexual Activity   Alcohol use: No   Drug use: Never   Sexual  activity: Yes  Other Topics Concern   Not on file  Social History Narrative   Not on file   Social Drivers of Health   Financial Resource Strain: Low Risk  (03/25/2023)   Received from [facility] [employer] Strain (CARDIA)    Difficulty of Paying Living Expenses: Not hard at all  Food Insecurity: No Food Insecurity (06/24/2023)   Hunger Vital Sign    Worried About Running Out of Food in the Last Year: Never true    Ran Out of Food in the Last Year: Never true  Transportation Needs: No Transportation Needs (06/24/2023)   PRAPARE - Transportation  Lack of Transportation (Medical): No    Lack of Transportation (Non-Medical): No  Physical Activity: Not on file  Stress: Not on file  Social Connections: Not on file  Intimate Partner Violence: Not At Risk (06/24/2023)   Humiliation, Afraid, Rape, and Kick questionnaire    Fear of Current or Ex-Partner: No    Emotionally Abused: No    Physically Abused: No    Sexually Abused: No    Family History  Problem Relation Age of Onset   Heart attack Mother    Heart attack Father    Heart attack Maternal Uncle     Past Surgical History:  Procedure Laterality Date   ABDOMINAL AORTOGRAM W/LOWER EXTREMITY N/A 06/04/2023   Procedure: ABDOMINAL AORTOGRAM W/LOWER EXTREMITY;  Surgeon: Leonie Douglas, MD;  Location: [facility] INVASIVE CV LAB;  Service: Cardiovascular;  Laterality: N/A;   CORONARY ARTERY BYPASS GRAFT     FEMORAL-POPLITEAL BYPASS GRAFT Left 06/24/2023   Procedure: BYPASS GRAFT FEMORAL- ABOVE KNEE POPLITEAL ARTERY;  Surgeon: Leonie Douglas, MD;  Location: [facility] OR;  Service: Vascular;  Laterality: Left;   RIGHT/LEFT HEART CATH AND CORONARY/GRAFT ANGIOGRAPHY N/A 06/15/2023   Procedure: RIGHT/LEFT HEART CATH AND CORONARY/GRAFT ANGIOGRAPHY;  Surgeon: Tonny Bollman, MD;  Location: [facility] INVASIVE CV LAB;  Service: Cardiovascular;  Laterality: N/A;    ROS: Review of Systems Negative except as stated  above  PHYSICAL EXAM: There were no vitals taken for this visit.  Physical Exam   General appearance - alert, well appearing, and in no distress Mental status - normal mood, behavior, speech, dress, motor activity, and thought processes Chest -breath sounds mildly decreased.  No wheezes or crackles heard. Heart - normal rate, regular rhythm, normal S1, S2, no murmurs, rubs, clicks or gallops Extremities -1+ edema in the left lower leg.  No edema in the right leg.     Latest Ref Rng & Units 08/06/2023    8:23 AM 07/25/2023    2:33 AM 07/24/2023    8:07 PM  CMP  Glucose 70 - 99 mg/dL 92  254    BUN 6 - 20 mg/dL 13  21    Creatinine 2.70 - 1.24 mg/dL 6.23  7.62  8.31   Sodium 135 - 145 mmol/L 139  138    Potassium 3.5 - 5.1 mmol/L 4.1  3.9    Chloride 98 - 111 mmol/L 107  106    CO2 22 - 32 mmol/L 25  23    Calcium 8.9 - 10.3 mg/dL 8.8  8.4    Total Protein 6.5 - 8.1 g/dL 6.6     Total Bilirubin 0.0 - 1.2 mg/dL 1.6     Alkaline Phos 38 - 126 U/L 105     AST 15 - 41 U/L 26     ALT 0 - 44 U/L 31      Lipid Panel     Component Value Date/Time   CHOL 103 06/25/2023 0342   TRIG 75 06/25/2023 0342   HDL 29 (L) 06/25/2023 0342   CHOLHDL 3.6 06/25/2023 0342   VLDL 15 06/25/2023 0342   LDLCALC 59 06/25/2023 0342    CBC    Component Value Date/Time   WBC 6.2 08/06/2023 0823   RBC 5.20 08/06/2023 0823   HGB 15.7 08/06/2023 0823   HGB 15.7 06/09/2023 1055   HCT 49.6 08/06/2023 0823   HCT 49.0 06/09/2023 1055   PLT 164 08/06/2023 0823   PLT 115 (L) 06/09/2023 1055   MCV 95.4 08/06/2023 0823  MCV 97 06/09/2023 1055   MCH 30.2 08/06/2023 0823   MCHC 31.7 08/06/2023 0823   RDW 14.8 08/06/2023 0823   RDW 12.2 06/09/2023 1055   LYMPHSABS 1.8 08/06/2023 0823   MONOABS 0.7 08/06/2023 0823   EOSABS 0.1 08/06/2023 0823   BASOSABS 0.1 08/06/2023 0823    ASSESSMENT AND PLAN: 1. Type 2 diabetes mellitus with hyperglycemia, with long-term current use of insulin (HCC)  (Primary) Encouraged him to continue trying to eat healthy.  Continue Jardiance 25 mg daily and Lantus insulin 15 units daily. - POCT glucose (manual entry) - Ambulatory referral to Ophthalmology  2. Diabetes mellitus treated with oral medication (HCC) See #1 above.  3. PAF (paroxysmal atrial fibrillation) (HCC) On Eliquis, metoprolol and sotalol.  Tolerating the medications.  4. Moderate persistent asthma without complication Patient with history of childhood asthma.  Sounds as though the asthma has become active again.  Will prescribe Symbicort to use as controller.  Advised to rinse mouth out after each use.  Prescription sent to [facility] for him to get a nebulizer machine. - budesonide-formoterol (SYMBICORT) 80-4.5 MCG/ACT inhaler; Inhale 2 puffs into the lungs 2 (two) times daily.  Dispense: 1 each; Refill: 5 - For home use only DME Nebulizer machine - albuterol (PROVENTIL) (2.5 MG/3ML) 0.083% nebulizer solution; Take 3 mLs (2.5 mg total) by nebulization every 6 (six) hours as needed for wheezing or shortness of breath.  Dispense: 150 mL; Refill: 1  5. Edema of left lower leg Prescription given for a pair of compression socks below the knee. - For home use only DME Other see comment  6. Screening for colon cancer Patient has a Cologuard kit at home.  I have encouraged him to use it and turn it in to [employer].  He promises to do so.   Patient was given the opportunity to ask questions.  Patient verbalized understanding of the plan and was able to repeat key elements of the plan.   This documentation was completed using Paediatric nurse.  Any transcriptional errors are unintentional.  Orders Placed This Encounter  Procedures   POCT glucose (manual entry)     Requested Prescriptions    No prescriptions requested or ordered in this encounter    No follow-ups on file.  Jonah Blue, MD, FACP

## 2023-09-22 ENCOUNTER — Encounter: Payer: Self-pay | Admitting: Podiatry

## 2023-09-22 ENCOUNTER — Ambulatory Visit (INDEPENDENT_AMBULATORY_CARE_PROVIDER_SITE_OTHER): Admitting: Podiatry

## 2023-09-22 VITALS — Ht 69.0 in | Wt 229.0 lb

## 2023-09-22 DIAGNOSIS — I739 Peripheral vascular disease, unspecified: Secondary | ICD-10-CM

## 2023-09-22 DIAGNOSIS — L97521 Non-pressure chronic ulcer of other part of left foot limited to breakdown of skin: Secondary | ICD-10-CM

## 2023-09-22 MED ORDER — DOXYCYCLINE HYCLATE 100 MG PO TABS: 100.0000 mg | ORAL_TABLET | Freq: Two times a day (BID) | ORAL | 1 refills | Status: DC

## 2023-09-22 NOTE — Progress Notes (Signed)
 Subjective:   Patient ID: Michael Barnett, male   DOB: 53 y.o.   MRN: [id number]   HPI Patient presents with painful lesion plantar left and nails that are elongated which are bothersome for him.  States it is hard for him to walk and patient is in poor health did have a vascular procedure done left but he is concerned because he is getting more pain in his leg   ROS      Objective:  Physical Exam  Neurovascular status is diminished with diminishment of pulses left DP and PT and moderately on the right.  Patient's got keratotic tissue formation plantar left fourth metatarsal with some swelling about.  No active drainage and has elongated nailbeds     Assessment:  Poor health individual who needs to go back to his vascular dog and has contacted him to see him again and should see him in the next couple days with preulcerative type lesion and nail disease     Plan:  H&P reviewed everything at great length and also diabetic shoes which she is scheduled for.  I did do sharp debridement of area I did not note any active drainage hide as precautionary placed on doxycycline just to be safe and I did courtesy debridement of lesions.  He will see his vascular doctor and may require other treatments depending on what they find

## 2023-09-28 ENCOUNTER — Emergency Department (HOSPITAL_COMMUNITY)

## 2023-09-28 ENCOUNTER — Other Ambulatory Visit: Payer: Self-pay

## 2023-09-28 ENCOUNTER — Inpatient Hospital Stay (HOSPITAL_COMMUNITY)
Admission: EM | Admit: 2023-09-28 | Discharge: 2023-09-30 | DRG: 291 | Disposition: A | Discharge status: Home / Self Care | Attending: Internal Medicine | Admitting: Internal Medicine

## 2023-09-28 ENCOUNTER — Encounter (HOSPITAL_COMMUNITY): Payer: Self-pay | Admitting: *Deleted

## 2023-09-28 DIAGNOSIS — Z7951 Long term (current) use of inhaled steroids: Secondary | ICD-10-CM

## 2023-09-28 DIAGNOSIS — R0789 Other chest pain: Secondary | ICD-10-CM | POA: Diagnosis present

## 2023-09-28 DIAGNOSIS — Z7982 Long term (current) use of aspirin: Secondary | ICD-10-CM

## 2023-09-28 DIAGNOSIS — Z8673 Personal history of transient ischemic attack (TIA), and cerebral infarction without residual deficits: Secondary | ICD-10-CM

## 2023-09-28 DIAGNOSIS — Z794 Long term (current) use of insulin: Secondary | ICD-10-CM

## 2023-09-28 DIAGNOSIS — Z79899 Other long term (current) drug therapy: Secondary | ICD-10-CM

## 2023-09-28 DIAGNOSIS — I10 Essential (primary) hypertension: Secondary | ICD-10-CM | POA: Diagnosis present

## 2023-09-28 DIAGNOSIS — E119 Type 2 diabetes mellitus without complications: Secondary | ICD-10-CM

## 2023-09-28 DIAGNOSIS — I255 Ischemic cardiomyopathy: Secondary | ICD-10-CM | POA: Diagnosis present

## 2023-09-28 DIAGNOSIS — I2510 Atherosclerotic heart disease of native coronary artery without angina pectoris: Secondary | ICD-10-CM | POA: Diagnosis present

## 2023-09-28 DIAGNOSIS — R079 Chest pain, unspecified: Principal | ICD-10-CM

## 2023-09-28 DIAGNOSIS — G4733 Obstructive sleep apnea (adult) (pediatric): Secondary | ICD-10-CM | POA: Diagnosis present

## 2023-09-28 DIAGNOSIS — I252 Old myocardial infarction: Secondary | ICD-10-CM

## 2023-09-28 DIAGNOSIS — R072 Precordial pain: Secondary | ICD-10-CM | POA: Diagnosis not present

## 2023-09-28 DIAGNOSIS — Z95810 Presence of automatic (implantable) cardiac defibrillator: Secondary | ICD-10-CM

## 2023-09-28 DIAGNOSIS — I5033 Acute on chronic diastolic (congestive) heart failure: Secondary | ICD-10-CM | POA: Diagnosis not present

## 2023-09-28 DIAGNOSIS — I5043 Acute on chronic combined systolic (congestive) and diastolic (congestive) heart failure: Secondary | ICD-10-CM | POA: Diagnosis present

## 2023-09-28 DIAGNOSIS — Z87891 Personal history of nicotine dependence: Secondary | ICD-10-CM

## 2023-09-28 DIAGNOSIS — Z7901 Long term (current) use of anticoagulants: Secondary | ICD-10-CM

## 2023-09-28 DIAGNOSIS — E1122 Type 2 diabetes mellitus with diabetic chronic kidney disease: Secondary | ICD-10-CM

## 2023-09-28 DIAGNOSIS — I080 Rheumatic disorders of both mitral and aortic valves: Secondary | ICD-10-CM | POA: Diagnosis present

## 2023-09-28 DIAGNOSIS — Z7984 Long term (current) use of oral hypoglycemic drugs: Secondary | ICD-10-CM

## 2023-09-28 DIAGNOSIS — I480 Paroxysmal atrial fibrillation: Secondary | ICD-10-CM | POA: Diagnosis not present

## 2023-09-28 DIAGNOSIS — I110 Hypertensive heart disease with heart failure: Principal | ICD-10-CM | POA: Diagnosis present

## 2023-09-28 DIAGNOSIS — Z888 Allergy status to other drugs, medicaments and biological substances status: Secondary | ICD-10-CM

## 2023-09-28 DIAGNOSIS — Z8249 Family history of ischemic heart disease and other diseases of the circulatory system: Secondary | ICD-10-CM

## 2023-09-28 DIAGNOSIS — E1151 Type 2 diabetes mellitus with diabetic peripheral angiopathy without gangrene: Secondary | ICD-10-CM | POA: Diagnosis present

## 2023-09-28 DIAGNOSIS — I2489 Other forms of acute ischemic heart disease: Secondary | ICD-10-CM | POA: Diagnosis present

## 2023-09-28 DIAGNOSIS — E785 Hyperlipidemia, unspecified: Secondary | ICD-10-CM | POA: Diagnosis present

## 2023-09-28 DIAGNOSIS — I509 Heart failure, unspecified: Secondary | ICD-10-CM

## 2023-09-28 DIAGNOSIS — Z9861 Coronary angioplasty status: Secondary | ICD-10-CM

## 2023-09-28 DIAGNOSIS — M1990 Unspecified osteoarthritis, unspecified site: Secondary | ICD-10-CM | POA: Diagnosis present

## 2023-09-28 DIAGNOSIS — I25810 Atherosclerosis of coronary artery bypass graft(s) without angina pectoris: Secondary | ICD-10-CM | POA: Diagnosis present

## 2023-09-28 LAB — HEPATIC FUNCTION PANEL
ALT: 32 U/L (ref 0–44)
AST: 32 U/L (ref 15–41)
Albumin: 3.5 g/dL (ref 3.5–5.0)
Alkaline Phosphatase: 122 U/L (ref 38–126)
Bilirubin, Direct: 0.3 mg/dL — ABNORMAL HIGH (ref 0.0–0.2)
Indirect Bilirubin: 1.2 mg/dL — ABNORMAL HIGH (ref 0.3–0.9)
Total Bilirubin: 1.5 mg/dL — ABNORMAL HIGH (ref 0.0–1.2)
Total Protein: 6.6 g/dL (ref 6.5–8.1)

## 2023-09-28 LAB — GLUCOSE, CAPILLARY
Glucose-Capillary: 205 mg/dL — ABNORMAL HIGH (ref 70–99)
Glucose-Capillary: 244 mg/dL — ABNORMAL HIGH (ref 70–99)

## 2023-09-28 LAB — CBC
HCT: 48.8 % (ref 39.0–52.0)
Hemoglobin: 15.3 g/dL (ref 13.0–17.0)
MCH: 30.2 pg (ref 26.0–34.0)
MCHC: 31.4 g/dL (ref 30.0–36.0)
MCV: 96.3 fL (ref 80.0–100.0)
Platelets: 136 10*3/uL — ABNORMAL LOW (ref 150–400)
RBC: 5.07 MIL/uL (ref 4.22–5.81)
RDW: 14.9 % (ref 11.5–15.5)
WBC: 7.4 10*3/uL (ref 4.0–10.5)
nRBC: 0 % (ref 0.0–0.2)

## 2023-09-28 LAB — BASIC METABOLIC PANEL WITH GFR
Anion gap: 8 (ref 5–15)
BUN: 21 mg/dL — ABNORMAL HIGH (ref 6–20)
CO2: 24 mmol/L (ref 22–32)
Calcium: 8.7 mg/dL — ABNORMAL LOW (ref 8.9–10.3)
Chloride: 105 mmol/L (ref 98–111)
Creatinine, Ser: 1.34 mg/dL — ABNORMAL HIGH (ref 0.61–1.24)
GFR, Estimated: >60 mL/min (ref >60)
Glucose, Bld: 228 mg/dL — ABNORMAL HIGH (ref 70–99)
Potassium: 4 mmol/L (ref 3.5–5.1)
Sodium: 137 mmol/L (ref 135–145)

## 2023-09-28 LAB — TROPONIN I (HIGH SENSITIVITY)
Troponin I (High Sensitivity): 14 ng/L (ref <18)
Troponin I (High Sensitivity): 17 ng/L (ref <18)

## 2023-09-28 LAB — BRAIN NATRIURETIC PEPTIDE: B Natriuretic Peptide: 855 pg/mL — ABNORMAL HIGH (ref 0.0–100.0)

## 2023-09-28 LAB — CBG MONITORING, ED: Glucose-Capillary: 148 mg/dL — ABNORMAL HIGH (ref 70–99)

## 2023-09-28 LAB — LIPASE, BLOOD: Lipase: 24 U/L (ref 11–51)

## 2023-09-28 LAB — HIV ANTIBODY (ROUTINE TESTING W REFLEX): HIV Screen 4th Generation wRfx: NONREACTIVE

## 2023-09-28 MED ORDER — ONDANSETRON HCL 4 MG PO TABS: 4.0000 mg | ORAL_TABLET | Freq: Four times a day (QID) | ORAL | Status: DC | PRN

## 2023-09-28 MED ORDER — ALBUTEROL SULFATE (2.5 MG/3ML) 0.083% IN NEBU: 2.5000 mg | INHALATION_SOLUTION | Freq: Four times a day (QID) | RESPIRATORY_TRACT | Status: DC | PRN

## 2023-09-28 MED ORDER — FUROSEMIDE 10 MG/ML IJ SOLN
40.0000 mg | Freq: Once | INTRAMUSCULAR | Status: AC
  Administered 2023-09-28: 40 mg via INTRAVENOUS
  Filled 2023-09-28: qty 4

## 2023-09-28 MED ORDER — MORPHINE SULFATE (PF) 2 MG/ML IV SOLN
2.0000 mg | INTRAVENOUS | Status: DC | PRN
  Administered 2023-09-28 – 2023-09-30 (×4): 2 mg via INTRAVENOUS
  Filled 2023-09-28 (×4): qty 1

## 2023-09-28 MED ORDER — EMPAGLIFLOZIN 25 MG PO TABS
25.0000 mg | ORAL_TABLET | Freq: Every day | ORAL | Status: DC
  Administered 2023-09-28 – 2023-09-30 (×3): 25 mg via ORAL
  Filled 2023-09-28 (×3): qty 1

## 2023-09-28 MED ORDER — INSULIN ASPART 100 UNIT/ML IJ SOLN
0.0000 [IU] | Freq: Three times a day (TID) | INTRAMUSCULAR | Status: DC
Start: 2023-09-28 — End: 2023-09-30
  Administered 2023-09-28: 3 [IU] via SUBCUTANEOUS
  Administered 2023-09-28: 1 [IU] via SUBCUTANEOUS
  Administered 2023-09-29: 3 [IU] via SUBCUTANEOUS
  Administered 2023-09-29: 2 [IU] via SUBCUTANEOUS
  Administered 2023-09-29: 1 [IU] via SUBCUTANEOUS
  Administered 2023-09-30 (×2): 2 [IU] via SUBCUTANEOUS

## 2023-09-28 MED ORDER — ACETAMINOPHEN 325 MG PO TABS
650.0000 mg | ORAL_TABLET | Freq: Four times a day (QID) | ORAL | Status: DC | PRN
  Administered 2023-09-28 – 2023-09-29 (×2): 650 mg via ORAL
  Filled 2023-09-28 (×2): qty 2

## 2023-09-28 MED ORDER — ORAL CARE MOUTH RINSE: 15.0000 mL | OROMUCOSAL | Status: DC | PRN

## 2023-09-28 MED ORDER — ACETAMINOPHEN 650 MG RE SUPP: 650.0000 mg | Freq: Four times a day (QID) | RECTAL | Status: DC | PRN

## 2023-09-28 MED ORDER — MOMETASONE FURO-FORMOTEROL FUM 100-5 MCG/ACT IN AERO
2.0000 | INHALATION_SPRAY | Freq: Two times a day (BID) | RESPIRATORY_TRACT | Status: DC
  Administered 2023-09-28 – 2023-09-30 (×3): 2 via RESPIRATORY_TRACT
  Filled 2023-09-28 (×2): qty 8.8

## 2023-09-28 MED ORDER — SOTALOL HCL 80 MG PO TABS
160.0000 mg | ORAL_TABLET | Freq: Two times a day (BID) | ORAL | Status: DC
  Administered 2023-09-28 – 2023-09-30 (×5): 160 mg via ORAL
  Filled 2023-09-28 (×7): qty 2

## 2023-09-28 MED ORDER — FUROSEMIDE 10 MG/ML IJ SOLN
40.0000 mg | Freq: Two times a day (BID) | INTRAMUSCULAR | Status: DC
Start: 2023-09-28 — End: 2023-09-29
  Administered 2023-09-28 – 2023-09-29 (×2): 40 mg via INTRAVENOUS
  Filled 2023-09-28 (×2): qty 4

## 2023-09-28 MED ORDER — INSULIN GLARGINE-YFGN 100 UNIT/ML ~~LOC~~ SOLN
15.0000 [IU] | Freq: Every day | SUBCUTANEOUS | Status: DC
Start: 2023-09-28 — End: 2023-09-30
  Administered 2023-09-28 – 2023-09-29 (×2): 15 [IU] via SUBCUTANEOUS
  Filled 2023-09-28 (×4): qty 0.15

## 2023-09-28 MED ORDER — ASPIRIN 81 MG PO CHEW
81.0000 mg | CHEWABLE_TABLET | Freq: Every day | ORAL | Status: DC
  Administered 2023-09-29 – 2023-09-30 (×2): 81 mg via ORAL
  Filled 2023-09-28 (×2): qty 1

## 2023-09-28 MED ORDER — APIXABAN 5 MG PO TABS
5.0000 mg | ORAL_TABLET | Freq: Two times a day (BID) | ORAL | Status: DC
  Administered 2023-09-28 – 2023-09-29 (×4): 5 mg via ORAL
  Filled 2023-09-28 (×4): qty 1

## 2023-09-28 MED ORDER — ATORVASTATIN CALCIUM 80 MG PO TABS
80.0000 mg | ORAL_TABLET | Freq: Every day | ORAL | Status: DC
  Administered 2023-09-28 – 2023-09-30 (×3): 80 mg via ORAL
  Filled 2023-09-28 (×3): qty 1

## 2023-09-28 MED ORDER — ASPIRIN 81 MG PO CHEW
324.0000 mg | CHEWABLE_TABLET | Freq: Once | ORAL | Status: AC
  Administered 2023-09-28: 324 mg via ORAL
  Filled 2023-09-28: qty 4

## 2023-09-28 MED ORDER — NITROGLYCERIN 0.4 MG SL SUBL
0.4000 mg | SUBLINGUAL_TABLET | SUBLINGUAL | Status: DC | PRN
  Administered 2023-09-28 (×2): 0.4 mg via SUBLINGUAL
  Filled 2023-09-28 (×2): qty 1

## 2023-09-28 MED ORDER — MORPHINE SULFATE (PF) 4 MG/ML IV SOLN
4.0000 mg | Freq: Once | INTRAVENOUS | Status: AC
  Administered 2023-09-28: 4 mg via INTRAVENOUS
  Filled 2023-09-28: qty 1

## 2023-09-28 MED ORDER — ONDANSETRON HCL 4 MG/2ML IJ SOLN: 4.0000 mg | Freq: Four times a day (QID) | INTRAMUSCULAR | Status: DC | PRN

## 2023-09-28 MED ORDER — METOPROLOL TARTRATE 25 MG PO TABS
25.0000 mg | ORAL_TABLET | Freq: Two times a day (BID) | ORAL | Status: DC
  Administered 2023-09-28 – 2023-09-29 (×4): 25 mg via ORAL
  Filled 2023-09-28 (×4): qty 1

## 2023-09-28 MED ORDER — INSULIN ASPART 100 UNIT/ML IJ SOLN
0.0000 [IU] | Freq: Every day | INTRAMUSCULAR | Status: DC
  Administered 2023-09-28 – 2023-09-29 (×2): 2 [IU] via SUBCUTANEOUS

## 2023-09-28 MED ORDER — PANTOPRAZOLE SODIUM 40 MG PO TBEC
40.0000 mg | DELAYED_RELEASE_TABLET | Freq: Every day | ORAL | Status: DC
  Administered 2023-09-28: 40 mg via ORAL
  Filled 2023-09-28: qty 1

## 2023-09-28 MED ORDER — PANTOPRAZOLE SODIUM 40 MG PO TBEC
40.0000 mg | DELAYED_RELEASE_TABLET | Freq: Two times a day (BID) | ORAL | Status: DC
  Administered 2023-09-28 – 2023-09-30 (×4): 40 mg via ORAL
  Filled 2023-09-28 (×4): qty 1

## 2023-09-28 MED ORDER — INSULIN GLARGINE 100 UNITS/ML SOLOSTAR PEN: 15.0000 [IU] | PEN_INJECTOR | Freq: Every day | SUBCUTANEOUS | Status: DC

## 2023-09-28 MED ORDER — ONDANSETRON HCL 4 MG/2ML IJ SOLN
4.0000 mg | Freq: Once | INTRAMUSCULAR | Status: AC
  Administered 2023-09-28: 4 mg via INTRAVENOUS
  Filled 2023-09-28: qty 2

## 2023-09-28 NOTE — ED Provider Notes (Addendum)
 [facility] EMERGENCY DEPARTMENT AT [facility] Provider Note   CSN: [id number] Arrival date & time: 09/28/23  4098     History  Chief Complaint  Patient presents with   Chest Pain    Michael Barnett is a 53 y.o. male.  Presents to the emergency department for evaluation of chest pain.  Patient has a history of significant cardiac disease.  He reports that he has been experiencing chest pain for about 3 days but it significantly worsened tonight.  He also has been experiencing intermittent racing heartbeat.  He reports nausea, vomiting and abdominal discomfort as well.       Home Medications Prior to Admission medications   Medication Sig Start Date End Date Taking? Authorizing Provider  albuterol (PROVENTIL) (2.5 MG/3ML) 0.083% nebulizer solution Take 3 mLs (2.5 mg total) by nebulization every 6 (six) hours as needed for wheezing or shortness of breath. 08/26/23   [name], MD  albuterol (VENTOLIN HFA) 108 (90 Base) MCG/ACT inhaler Inhale 1-2 puffs into the lungs every 6 (six) hours as needed for wheezing or shortness of breath. 08/06/23   [name], DO  apixaban (ELIQUIS) 5 MG TABS tablet Take 1 tablet (5 mg total) by mouth 2 (two) times daily. 07/25/23   Parcells, Therisa Doyne, PA-C  aspirin 81 MG chewable tablet Chew 1 tablet (81 mg total) by mouth daily. 08/13/23   [name], NP  atorvastatin (LIPITOR) 80 MG tablet Take 1 tablet (80 mg total) by mouth daily. 08/13/23   [name], NP  budesonide-formoterol (SYMBICORT) 80-4.5 MCG/ACT inhaler Inhale 2 puffs into the lungs 2 (two) times daily. 08/26/23   [name], MD  Continuous Glucose Receiver (FREESTYLE LIBRE 3 READER) DEVI UAD to monitor blood sugar 07/12/23   [name], MD  Continuous Glucose Sensor (FREESTYLE LIBRE 3 PLUS SENSOR) MISC Change sensor every 15 days. 07/12/23   [name], MD  doxycycline (VIBRA-TABS) 100 MG tablet Take 1 tablet (100 mg total) by mouth 2 (two)  times daily. 09/22/23   Lenn Sink, DPM  empagliflozin (JARDIANCE) 25 MG TABS tablet Take 25 mg by mouth daily.    [provider]  Fingerstix Lancets MISC Use as directed with testing supplies up to 4 times daily. 06/28/23   [name], [name], PA-C  glucose blood test strip Use as instructed 06/28/23   [name], [name], PA-C  insulin glargine (LANTUS) 100 unit/mL SOPN Inject 15 Units into the skin at bedtime. 07/12/23   [name], MD  Lancet Devices (TRUEDRAW LANCING DEVICE) MISC Use as directed up to four times daily 06/28/23   [name], [name], PA-C  metoprolol tartrate (LOPRESSOR) 25 MG tablet Take 1 tablet (25 mg total) by mouth 2 (two) times daily. 08/13/23   [name], NP  nitroGLYCERIN (NITROSTAT) 0.4 MG SL tablet Place 0.4 mg under the tongue every 5 (five) minutes as needed for chest pain.    [provider]  oxyCODONE-acetaminophen (PERCOCET) 10-325 MG tablet Take 1 tablet by mouth every 4 (four) hours as needed for pain.    [provider]  pantoprazole (PROTONIX) 40 MG tablet Take 1 tablet (40 mg total) by mouth daily. 08/11/23   [name], PA-C  sotalol (BETAPACE) 160 MG tablet Take 1 tablet (160 mg total) by mouth 2 (two) times daily. 08/13/23   [name], NP  torsemide (DEMADEX) 20 MG tablet Take 1 tablet (20 mg total) by mouth daily as needed (swelling).  08/13/23   [name], NP  valsartan (DIOVAN) 80 MG tablet Take 1 tablet (80 mg total) by mouth daily. 06/28/23   [name], [name], PA-C      Allergies    Victoza [liraglutide]    Review of Systems   Review of Systems  Physical Exam Updated Vital Signs BP 130/64   Pulse 69   Temp 98 F (36.7 C)   Resp 15   SpO2 93%  Physical Exam Vitals and nursing note reviewed.  Constitutional:      General: He is not in acute distress.    Appearance: He is well-developed.  HENT:     Head: Normocephalic and atraumatic.     Mouth/Throat:     Mouth: Mucous membranes are  moist.  Eyes:     General: Vision grossly intact. Gaze aligned appropriately.     Extraocular Movements: Extraocular movements intact.     Conjunctiva/sclera: Conjunctivae normal.  Cardiovascular:     Rate and Rhythm: Normal rate and regular rhythm.     Pulses: Normal pulses.     Heart sounds: Normal heart sounds, S1 normal and S2 normal. No murmur heard.    No friction rub. No gallop.  Pulmonary:     Effort: Pulmonary effort is normal. No respiratory distress.     Breath sounds: Normal breath sounds.  Abdominal:     Palpations: Abdomen is soft.     Tenderness: There is no abdominal tenderness. There is no guarding or rebound.     Hernia: No hernia is present.  Musculoskeletal:        General: No swelling.     Cervical back: Full passive range of motion without pain, normal range of motion and neck supple. No pain with movement, spinous process tenderness or muscular tenderness. Normal range of motion.     Right lower leg: Edema present.     Left lower leg: Edema present.  Skin:    General: Skin is warm and dry.     Capillary Refill: Capillary refill takes less than 2 seconds.     Findings: No ecchymosis, erythema, lesion or wound.  Neurological:     Mental Status: He is alert and oriented to person, place, and time.     GCS: GCS eye subscore is 4. GCS verbal subscore is 5. GCS motor subscore is 6.     Cranial Nerves: Cranial nerves 2-12 are intact.     Sensory: Sensation is intact.     Motor: Motor function is intact. No weakness or abnormal muscle tone.     Coordination: Coordination is intact.  Psychiatric:        Mood and Affect: Mood normal.        Speech: Speech normal.        Behavior: Behavior normal.     ED Results / Procedures / Treatments   Labs (all labs ordered are listed, but only abnormal results are displayed) Labs Reviewed  BASIC METABOLIC PANEL WITH GFR - Abnormal; Notable for the following components:      Result Value   Glucose, Bld 228 (*)    BUN 21  (*)    Creatinine, Ser 1.34 (*)    Calcium 8.7 (*)    All other components within normal limits  CBC - Abnormal; Notable for the following components:   Platelets 136 (*)    All other components within normal limits  HEPATIC FUNCTION PANEL - Abnormal; Notable for the following components:   Total Bilirubin 1.5 (*)  Bilirubin, Direct 0.3 (*)    Indirect Bilirubin 1.2 (*)    All other components within normal limits  BRAIN NATRIURETIC PEPTIDE - Abnormal; Notable for the following components:   B Natriuretic Peptide 855.0 (*)    All other components within normal limits  LIPASE, BLOOD  TROPONIN I (HIGH SENSITIVITY)  TROPONIN I (HIGH SENSITIVITY)    EKG EKG Interpretation Date/Time:  Tuesday September 28 2023 00:35:37 EDT Ventricular Rate:  73 PR Interval:  156 QRS Duration:  118 QT Interval:  388 QTC Calculation: 427 R Axis:   18  Text Interpretation: Normal sinus rhythm Possible Left atrial enlargement Cannot rule out Anterior infarct , age undetermined Abnormal ECG When compared with ECG of 13-Aug-2023 09:35, No significant change was found Confirmed by Dione Booze ([id number]) on 09/28/2023 12:39:23 AM  Radiology DG Chest Portable 1 View Result Date: 09/28/2023 CLINICAL DATA:  Shortness of breath and chest pain EXAM: PORTABLE CHEST 1 VIEW COMPARISON:  08/06/2023 FINDINGS: Stable cardiomegaly. Left chest wall ICD. Sternotomy and CABG. Interstitial coarsening in the lower lungs. Question trace bilateral pleural effusions. No pneumothorax. No focal consolidation. IMPRESSION: Cardiomegaly. Question mild interstitial edema and trace bilateral pleural effusions. Electronically Signed   By: Minerva Fester M.D.   On: 09/28/2023 00:46    Procedures Procedures    Medications Ordered in ED Medications  furosemide (LASIX) injection 40 mg (has no administration in time range)  morphine (PF) 4 MG/ML injection 4 mg (4 mg Intravenous Given 09/28/23 0059)  ondansetron (ZOFRAN) injection 4 mg (4 mg  Intravenous Given 09/28/23 0059)  aspirin chewable tablet 324 mg (324 mg Oral Given 09/28/23 0058)    ED Course/ Medical Decision Making/ A&P                                 Medical Decision Making Amount and/or Complexity of Data Reviewed Labs: ordered. Radiology: ordered.  Risk OTC drugs. Prescription drug management. Decision regarding hospitalization.   Differential Diagnosis considered includes, but not limited to: STEMI; NSTEMI; myocarditis; pericarditis; pulmonary embolism; aortic dissection; pneumothorax; pneumonia; gastritis; musculoskeletal pain  Patient presents for evaluation of chest pain.  Patient experiencing intermittent chest pain for 3 days but pain significantly worsened tonight.  He has experiencing increasing swelling of his legs.  Patient also reports intermittent racing heartbeat.  Patient's history is complex.  He has a history of ischemic cardiomyopathy, ventricular tachycardia and atrial fibrillation.  Patient has had recent hospitalizations for volume overload.  Patient hospitalized in January for volume overload with possible V. tach after his ICD fired.  At that time it was determined that it was an inappropriate firing for atrial fibrillation.  Settings were changed.  ICD was interrogated.  Patient did have episodes of tachycardia over the last 48 hours.  It appears that they may be calling his V. tach in the record, but it does look like atrial fibrillation to me that was not sustained.  He has had increasing swelling of his legs consistent with volume overload.  Patient mildly hypoxic, sats 88% on room air.  Placed on nasal cannula.  Will treat for CHF with IV Lasix.  Patient with significant cardiac disease and increasing chest pain, will ask hospitalist to monitor patient in the hospital.          Final Clinical Impression(s) / ED Diagnoses Final diagnoses:  Chest pain, unspecified type  Acute on chronic congestive heart failure, unspecified  heart failure type (  [facility])    Rx / DC Orders ED Discharge Orders     None         Cimone Fahey, Canary Brim, MD 09/28/23 0451    Gilda Crease, MD 09/28/23 [phone]

## 2023-09-28 NOTE — Consult Note (Addendum)
 Cardiology Consultation   Patient ID: Michael [facility] MRN: [id number]; DOB: [birth year]  Admit date: 09/28/2023 Date of Consult: 09/28/2023  PCP:  Michael Matar, MD   [facility] Providers Cardiologist:  Michael Pyo, MD  Electrophysiologist:  Michael Small, MD  {    Patient Profile:   Michael [facility] is a 53 y.o. 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  History of Present Illness:   Mr. Michael [facility]ett with above complex PMH who presented to ER for chest pain today.  He reports 3 days onset of chest pain intermittently. He states he had left sided chest pain on Friday where he was just resting and sitting. Pain was tight and squeezing sensation, radiating across the entire chest wall, he felt SOB at the same time, symptoms lasted 5 minutes and resolved spontaneously. He states similar episodes recurred three times that day. He had 2 episodes of lightheadedness the next day while driving. He felt quite dizzy where he had stop and prevent himself from falling. He denied syncope. Last night he had recurrent chest pain, pain was severe and pressure like, he had some nausea and emesis. He therefore came to ER.  He reports associated intermittent racing heartbeat sensation lately as well, but did not sense any shock from his ICD. He states he is taking torsemide 20mg  BID as needed at home for leg edema, usually need it 2-3 times a week. He felt his leg edema are worse currently. He is complaint with his cardiac medication, denied missed any medication. He is currently chest pain. His device was interrogated at  ED and print out is available. He states his heart rate was racing at 150s when he came to ER yesterday.   Diagnostic workup from today revealed glucose 228, BUN 21, creatinine 1.34, GFR >60, total bilirubin 1.5.  BNP 855.  High sensitive troponin 14 >17.  CBC with platelet 136k. CXR showed Question mild interstitial edema and trace bilateral pleural effusions. He is admitted to hospitalist for chest pain. Cardiology consulted for this matter.     Per extensive chart review, patient was previously followed by [facility] cardiology, transitioned to Dr. Lynnette [facility] on 06/09/2023.  Based on chart review on care everywhere, he was last seen by cardiology Dr. Langston [facility] 04/02/2023 at [facility].    He has known multivessel CAD, underwent CABG 2013 (LIMA to LAD and SVG to OM ) and subsequent PCI to RCA 2016.  He had chronic atypical chest pain.  Heart cath in 2017 revealed occluded SVG to OM, patent LIMA to LAD.  Repeat cath at [facility] 10/2020 revealing chronic multivessel CAD without revascularization option, LVEDP , patent LIMA to LAD and occluded SVG to OM.  He was maintained on aspirin and Lipitor 80 and Imdur for CAD, Ranexa discontinued due to QT prolongation in the past.    He has ischemic cardiomyopathy with LVEF 25% on Echo 02/2021.  LVEF improved to 40 to 45% on echo 11/27/2022, with inferior lateral hypokinesis.  He was maintained on torsemide as needed along with GDMT including valsartan, Jardiance, spironolactone in the past.  Entresto and Toprol were prescribed but discontinued for unclear reason in the past.  He was followed by advanced heart failure clinic at one point for possible transplantation, due to noncompliance he was declined.   He underwent [facility] single-chamber ICD implant 04/2011 for ischemic cardiomyopathy.  He had hx of appropriate and inappropriate shock in the past. He was noted having paroxysmal atrial tachycardia in the past. He was diagnosed with monomorphic VT in the setting of  [facility] bronchitis 05/2022, ultimately underwent successful VT ablation 10/2022.  He was maintained on Eliquis 5 mg twice daily for 1 month after ablation and this was stopped on 12/25/2022.  He has been maintained on sotalol 160 mg twice daily.   He saw Dr. Lynnette Barn[facility] 06/09/2023 for preop cardiac evaluation before left femoropopliteal bypass for critical limb ischemia due to occluded left SFA.  He denied having any chest pain or significant shortness of breath.  Device interrogation at the time showed occasional NSVT, no defibrillation.  He was arranged for right and left heart cath 06/15/23 which revealed severe native multivessel CAD with severe distal left main stenosis, total occlusion of proximal circumflex, severe proximal LAD stenosis, severe diffuse RCA stenosis; chronic occlusion of SVG to OM, patent LIMA to LAD; left circumflex branches with collaterals; PA mean , mean wedge pressure 19 mmHg, Transpulmonary gradient 18 mmHg with PVR 3 Wood units, Cardiac output 5.9 L/min.  It was felt he had elevated diastolic filling pressure but overall stable coronary anatomy.  Repeat echo on 06/15/2023 revealing LVEF 45 to 50%, grade 3 diastolic dysfunction, normal RV, moderate LAE, mild RAE, mild MR, aortic sclerosis, akinetic inferior wall, hypokinetic posterior wall.   He underwent left common femoral to above-knee popliteal bypass by Dr. Lenell [facility] 06/24/2023, postop course was uncomplicated.  He was maintained on aspirin 81 mg and Lipitor 80 mg for PAD/CAD.   He was hospitalized here 07/24/2023 under cardiology for ICD shock.  He reported heart palpitation and dizziness prior to ICD shock.  He was seen by EP Dr. Nelly Laurence, felt he had inappropriate shock for A-fib RVR due to sustained rate duration criteria being met by the device, which was programmed off.  He was continued on sotalol due to history of VT and frequent PVCs, metoprolol was added for A-fib rate control, Eliquis 5 mg twice daily was added for A-fib  anticoagulation.   He followed up with Brandi NP from EP on 08/13/23, doing well, no further VT or shocks on ICD interrogation with normal device function.  He was felt euvolemic on exam.   Past Medical History:  Diagnosis Date   AICD (automatic cardioverter/defibrillator) present    Arthritis    CHF (congestive heart failure) (HCC)    Coronary artery disease    Defibrillator activation    Diabetes mellitus    Hypertension    Myocardial infarction [facility])    Sleep apnea    Stroke [facility])     Past Surgical History:  Procedure Laterality Date   ABDOMINAL AORTOGRAM W/LOWER EXTREMITY N/A 06/04/2023   Procedure: ABDOMINAL AORTOGRAM W/LOWER EXTREMITY;  Surgeon: Leonie Douglas, MD;  Location: [facility] INVASIVE CV LAB;  Service: Cardiovascular;  Laterality: N/A;   CORONARY ARTERY BYPASS GRAFT     FEMORAL-POPLITEAL BYPASS GRAFT Left 06/24/2023   Procedure: BYPASS GRAFT FEMORAL- ABOVE KNEE POPLITEAL ARTERY;  Surgeon: Leonie Douglas, MD;  Location: [facility] OR;  Service: Vascular;  Laterality: Left;   RIGHT/LEFT HEART CATH AND CORONARY/GRAFT ANGIOGRAPHY N/A 06/15/2023   Procedure: RIGHT/LEFT HEART CATH  AND CORONARY/GRAFT ANGIOGRAPHY;  Surgeon: Tonny Bollman, MD;  Location: [facility];  Service: Cardiovascular;  Laterality: N/A;     Home Medications:  Prior to Admission medications   Medication Sig Start Date End Date Taking? Authorizing Provider  albuterol (PROVENTIL) (2.5 MG/3ML) 0.083% nebulizer solution Take 3 mLs (2.5 mg total) by nebulization every 6 (six) hours as needed for wheezing or shortness of breath. 08/26/23  Yes Michael Matar, MD  albuterol (VENTOLIN HFA) 108 (90 Base) MCG/ACT inhaler Inhale 1-2 puffs into the lungs every 6 (six) hours as needed for wheezing or shortness of breath. 08/06/23  Yes [name], DO  apixaban (ELIQUIS) 5 MG TABS tablet Take 1 tablet (5 mg total) by mouth 2 (two) times daily. 07/25/23  Yes Parcells, Therisa Doyne, PA-C  aspirin 81 MG chewable tablet Chew  1 tablet (81 mg total) by mouth daily. 08/13/23  Yes Ollis, Brandi L, NP  atorvastatin (LIPITOR) 80 MG tablet Take 1 tablet (80 mg total) by mouth daily. 08/13/23  Yes Ollis, Luetta Nutting, NP  budesonide-formoterol (SYMBICORT) 80-4.5 MCG/ACT inhaler Inhale 2 puffs into the lungs 2 (two) times daily. 08/26/23  Yes Michael Matar, MD  Continuous Glucose Sensor (FREESTYLE LIBRE 3 PLUS SENSOR) MISC Change sensor every 15 days. 07/12/23  Yes Michael Matar, MD  doxycycline (VIBRA-TABS) 100 MG tablet Take 1 tablet (100 mg total) by mouth 2 (two) times daily. 09/22/23  Yes Regal, Kirstie Peri, DPM  empagliflozin (JARDIANCE) 25 MG TABS tablet Take 25 mg by mouth daily.   Yes [provider]  insulin glargine (LANTUS) 100 unit/mL SOPN Inject 15 Units into the skin at bedtime. 07/12/23  Yes Michael Matar, MD  metoprolol tartrate (LOPRESSOR) 25 MG tablet Take 1 tablet (25 mg total) by mouth 2 (two) times daily. 08/13/23  Yes Ollis, Brandi L, NP  nitroGLYCERIN (NITROSTAT) 0.4 MG SL tablet Place 0.4 mg under the tongue every 5 (five) minutes as needed for chest pain.   Yes [provider]  oxyCODONE-acetaminophen (PERCOCET) 10-325 MG tablet Take 1 tablet by mouth every 4 (four) hours as needed for pain.   Yes [provider]  pantoprazole (PROTONIX) 40 MG tablet Take 1 tablet (40 mg total) by mouth daily. 08/11/23  Yes Georgian Co M, PA-C  sotalol (BETAPACE) 160 MG tablet Take 1 tablet (160 mg total) by mouth 2 (two) times daily. 08/13/23  Yes Ollis, Luetta Nutting, NP  torsemide (DEMADEX) 20 MG tablet Take 1 tablet (20 mg total) by mouth daily as needed (swelling). 08/13/23  Yes [name], NP  Continuous Glucose Receiver (FREESTYLE LIBRE 3 READER) DEVI UAD to monitor blood sugar 07/12/23   Michael Matar, MD  Fingerstix Lancets MISC Use as directed with testing supplies up to 4 times daily. 06/28/23   [name], [name], PA-C  glucose blood test strip Use as instructed 06/28/23   [name],  [name], PA-C  Lancet Devices (TRUEDRAW LANCING DEVICE) MISC Use as directed up to four times daily 06/28/23   [name], [name], PA-C  valsartan (DIOVAN) 80 MG tablet Take 1 tablet (80 mg total) by mouth daily. Patient not taking: Reported on 09/28/2023 06/28/23   [name], PA-C    Inpatient Medications: Scheduled Meds:  apixaban  5 mg Oral BID   [START ON 09/29/2023] aspirin  81 mg Oral Daily   atorvastatin  80 mg Oral Daily   empagliflozin  25 mg Oral Daily   furosemide  40 mg Intravenous BID  insulin aspart  0-5 Units Subcutaneous QHS   insulin aspart  0-9 Units Subcutaneous TID WC   insulin glargine-yfgn  15 Units Subcutaneous QHS   metoprolol tartrate  25 mg Oral BID   mometasone-formoterol  2 puff Inhalation BID   pantoprazole  40 mg Oral BID   sotalol  160 mg Oral BID   Continuous Infusions:  PRN Meds: acetaminophen **OR** acetaminophen, albuterol, nitroGLYCERIN  Allergies:    Allergies  Allergen Reactions   Victoza [Liraglutide] Other (See Comments)    Gave pt pancreatitis    Social History:   Social History   Socioeconomic History   Marital status: Single    Spouse name: Not on file   Number of children: Not on file   Years of education: Not on file   Highest education level: Not on file  Occupational History   Not on file  Tobacco Use   Smoking status: Former    Current packs/day: 0.00    Average packs/day: 0.5 packs/day for 35.0 years (17.5 ttl pk-yrs)    Types: Cigarettes    Start date: 35    Quit date: 06/30/2023    Years since quitting: 0.2   Smokeless tobacco: Not on file  Vaping Use   Vaping status: Never Used  Substance and Sexual Activity   Alcohol use: No   Drug use: Never   Sexual activity: Yes  Other Topics Concern   Not on file  Social History Narrative   Not on file   Social Drivers of Health   Financial Resource Strain: Low Risk  (03/25/2023)   Received from [facility] Strain  (CARDIA)    Difficulty of Paying Living Expenses: Not hard at all  Food Insecurity: No Food Insecurity (09/28/2023)   Hunger Vital Sign    Worried About Running Out of Food in the Last Year: Never true    Ran Out of Food in the Last Year: Never true  Transportation Needs: No Transportation Needs (09/28/2023)   PRAPARE - Administrator, Civil Service (Medical): No    Lack of Transportation (Non-Medical): No  Physical Activity: Not on file  Stress: Not on file  Social Connections: Not on file  Intimate Partner Violence: Not At Risk (09/28/2023)   Humiliation, Afraid, Rape, and Kick questionnaire    Fear of Current or Ex-Partner: No    Emotionally Abused: No    Physically Abused: No    Sexually Abused: No    Family History:    Family History  Problem Relation Age of Onset   Heart attack Mother    Heart attack Father    Heart attack Maternal Uncle      ROS:  Constitutional: Denied fever, chills, malaise, night sweats Eyes: Denied vision change or loss Ears/Nose/Mouth/Throat: Denied ear ache, sore throat, coughing, sinus pain Cardiovascular: see HPI  Respiratory:see HPI  Gastrointestinal: Denied nausea, vomiting, abdominal pain, diarrhea Genital/Urinary: Denied dysuria, hematuria, urinary frequency/urgency Musculoskeletal: Denied muscle ache, joint pain, weakness Skin: Denied rash, wound Neuro: see HPI  Psych: Denied history of depression/anxiety  Endocrine: history of diabetes   Physical Exam/Data:   Vitals:   09/28/23 0929 09/28/23 1012 09/28/23 1100 09/28/23 1444  BP:  132/81 123/73 115/68  Pulse:  64 (!) 59 (!) 124  Resp:   20 19  Temp: 97.7 F (36.5 C)   98 F (36.7 C)  TempSrc: Oral   Oral  SpO2:   98%   Weight:  101.1 kg  Height:    5\' 9"  (1.753 m)   No intake or output data in the 24 hours ending 09/28/23 1456    09/28/2023    2:44 PM 09/22/2023    8:58 AM 08/13/2023    9:39 AM  Last 3 Weights  Weight (lbs) 222 lb 12.8 oz 229 lb 229 lb  Weight  (kg) 101.061 kg 103.874 kg 103.874 kg     Body mass index is 32.9 kg/m.   Vitals:  Vitals:   09/28/23 1100 09/28/23 1444  BP: 123/73 115/68  Pulse: (!) 59 (!) 124  Resp: 20 19  Temp:  98 F (36.7 C)  SpO2: 98%    General Appearance: In no apparent distress, laying in bed, well nourished  HEENT: Normocephalic, atraumatic.  Neck: Supple, trachea midline, + JVDs Cardiovascular: Regular rate and rhythm, normal S1-S2,  no murmur Respiratory: Resting breathing unlabored, lungs sounds with fine crackles at base to auscultation bilaterally, no use of accessory muscles. On Florin oxygen  Gastrointestinal: Bowel sounds positive, abdomen soft, non-tender Extremities: Able to move all extremities in bed without difficulty, 2+ pitting edema LLE >RLE  Musculoskeletal: Normal muscle bulk and tone Skin: Intact, warm, dry. No rashes or petechiae noted in exposed areas.  Neurologic: Alert, oriented to person, place and time. no cognitive deficit, no gross focal neuro deficit Psychiatric: Normal affect. Mood is appropriate.     EKG:  The EKG was personally reviewed and demonstrates:    EKG today showed sinus rhythm 73bpm, non-specific IVCD, non-specific T wave abnormalities, no acute changes   Telemetry:  Telemetry was personally reviewed and demonstrates:    Sinus bradycardia 50s, frequent PVCs   Relevant CV Studies:   Echo from 06/15/23:   1. Left ventricular ejection fraction, by estimation, is 45 to 50%. The  left ventricle has mildly decreased function. The left ventricle  demonstrates regional wall motion abnormalities (see scoring  diagram/findings for description). Left ventricular  diastolic parameters are consistent with Grade III diastolic dysfunction  (restrictive).   2. Right ventricular systolic function is normal. The right ventricular  size is normal.   3. Left atrial size was moderately dilated.   4. Right atrial size was mildly dilated.   5. The mitral valve is normal  in structure. Mild mitral valve  regurgitation. No evidence of mitral stenosis.   6. The aortic valve is tricuspid. There is mild calcification of the  aortic valve. Aortic valve regurgitation is not visualized. Aortic valve  sclerosis/calcification is present, without any evidence of aortic  stenosis.   7. The inferior vena cava is normal in size with greater than 50%  respiratory variability, suggesting right atrial pressure of 3 mmHg.    R/L heart cath 06/15/23:  1.  Severe native vessel coronary artery disease with severe distal left main stenosis, total occlusion of the proximal circumflex, severe proximal LAD stenosis, and severe diffuse RCA stenosis 2.  Status post CABG with chronic occlusion of the SVG to OM and wide patency of the LIMA to LAD 3.  The left circumflex branches fill late from left to left collaterals 4.  The LIMA graft is large in caliber, widely patent, and fills a large area of myocardium as it backfills the proximal LAD and its branches, also supplies collaterals to the circumflex 5.  Preserved cardiac output with elevated diastolic filling pressures Mean PA pressure 37 mmHg Mean wedge pressure 19 mmHg Transpulmonary gradient 18 mmHg with PVR 3 Wood units Cardiac output 5.9 L/min  Patient appears to have stable coronary anatomy based on comparison to previous cath report.        Laboratory Data:  High Sensitivity Troponin:   Recent Labs  Lab 09/28/23 0049 09/28/23 0248  TROPONINIHS 14 17     Chemistry Recent Labs  Lab 09/28/23 0049  NA 137  K 4.0  CL 105  CO2 24  GLUCOSE 228*  BUN 21*  CREATININE 1.34*  CALCIUM 8.7*  GFRNONAA >60  ANIONGAP 8    Recent Labs  Lab 09/28/23 0053  PROT 6.6  ALBUMIN 3.5  AST 32  ALT 32  ALKPHOS 122  BILITOT 1.5*   Lipids No results for input(s): "CHOL", "TRIG", "HDL", "LABVLDL", "LDLCALC", "CHOLHDL" in the last 168 hours.  Hematology Recent Labs  Lab 09/28/23 0049  WBC 7.4  RBC 5.07  HGB 15.3   HCT 48.8  MCV 96.3  MCH 30.2  MCHC 31.4  RDW 14.9  PLT 136*   Thyroid No results for input(s): "TSH", "FREET4" in the last 168 hours.  BNP Recent Labs  Lab 09/28/23 0051  BNP 855.0*    DDimer No results for input(s): "DDIMER" in the last 168 hours.   Radiology/Studies:  DG Chest Portable 1 View Result Date: 09/28/2023 CLINICAL DATA:  Shortness of breath and chest pain EXAM: PORTABLE CHEST 1 VIEW COMPARISON:  08/06/2023 FINDINGS: Stable cardiomegaly. Left chest wall ICD. Sternotomy and CABG. Interstitial coarsening in the lower lungs. Question trace bilateral pleural effusions. No pneumothorax. No focal consolidation. IMPRESSION: Cardiomegaly. Question mild interstitial edema and trace bilateral pleural effusions. Electronically Signed   By: Minerva Fester M.D.   On: 09/28/2023 00:46     Assessment and Plan:   Acute on chronic combined CHF Ischemic cardiomyopathy with improved LVEF - presented with chest pain, leg edema, heart palpitation, dizziness over the past 3 days  - BNP 855 (was 1067 on 08/06/23) - CXR concern for edema  - Echo last 05/2023 showed LVEF 45 to 50%, grade 3 diastolic dysfunction, normal RV, moderate LAE, mild RAE, mild MR, aortic sclerosis, akinetic inferior wall, hypokinetic posterior wall.  - R/L heat cath 05/2023 as below  - will repeat Echo  - on PRN torsemide 20mg  BID 3-4 times weekly at home due to not wanting to void too often, clinically hypervolemic, will start IV Lasix 40mg  BID, monitor I&O, daily weight - GDMT: on PTA jardiance 25mg  daily, valsartan 80mg  daily, can hold both given mild AKI, if not worsening with diuresis, may resume; would change metoprolol 25mg  BID to Toprol XL at discharge   Chest pain Multivessel CAD s/p CABG 2013 with LIMA to LAD and SVG to OM and RCA PCI 2016 - hx of chronic atypical chest pain per [facility]  - presented with 3 days onset of resting self limiting chest pain, associated with racing heartbeat, nausea, leg  edema, SOB - L/R heart cath 06/15/2023 with severe native multivessel CAD, chronic occlusion of SVG to OM and patent LIMA to LAD, stable anatomy and no target for revascularization;  elevated diastolic filling pressures with PA 37, wedge 19, Transpulmonary gradient 18 mmHg with PVR 3 Wood units, CO 5.9L/min.  - Hs trop negative x2, EKG no acute changes  - suspect demand ischemia from decompensated CHF, ACS ruled out, would diuresis and re-assess symptoms, can add Imdur for anti-angina therapy  - continue medical therapy with PTA ASA 81mg , lipitor 80mg , metoprolol 25mg  BID, PTA valsartan 80mg  held for mild AKI   Heart palpitation  Dizziness/pre-syncope  Paroxysmal  A fib - ICD interrogation by EP 07/24/2023 hospitalization was felt consistent with A fib RVR with aberrancy triggering ICD inappropriate shock, he was maintained on sotalol, added metoprolol for rate control and eliquis for anticoagulation since  - device interrogation at ED revealed multiple episodes of NSVT on 09/19/23- 09/26/23 and one episode VT on 08/14/23, no shock has been delivered, limited information due to single lead ICD, reviewed interrogation report with EP team today, felt patient had paroxysmal SVT - currently he is in sinus bradycardia 50-60s, PVCs ; continue telemetry monitor  - continue PTA sotalol 160mg  BID, metoprolol 25mg  BID, eliquis 5mg  BID   Hx of monomorphic VT s/p ablation 10/2022 at [facility] single chamber ICD in situ with hx of inappropriate shock for A fib RVR 06/2023  - no recurrent VT or shocks  - continue sotalol 160mg  BID, metoprolol 25mg  BID  HTN HLD DM Hx of CVA  PAD  OSA - per primary team     Risk Assessment/Risk Scores:    New York Heart Association (NYHA) Functional Class NYHA Class III  CHA2DS2-VASc Score = 6   This indicates a 9.7% annual risk of stroke. The patient's score is based upon: CHF History: 1 HTN History: 1 Diabetes History: 1 Stroke History: 2 Vascular Disease History:  1 Age Score: 0 Gender Score: 0     For questions or updates, please contact [facility] Please consult [email or web address] for contact info under    Signed, Cyndi Bender, NP  09/28/2023 2:56 PM

## 2023-09-28 NOTE — Plan of Care (Signed)

## 2023-09-28 NOTE — H&P (Signed)
 History and Physical    Michael Barnett ZOX:[id number] DOB: 06-17-1971 DOA: 09/28/2023  PCP: [name], MD  Patient coming from: Home  I have personally briefly reviewed patient's old medical records available.   Chief Complaint: Chest tightness, nausea, fluid in the legs for 3 days.  HPI: Michael Barnett is a 53 y.o. male with medical history significant of ischemic cardiomyopathy with improved ejection fraction, chronic combined heart failure, coronary artery disease status post CABG in 2013 and multiple PCI after that.  VT ablation on sotalol and metoprolol.  Hypertension, diabetes, peripheral artery disease, sleep apnea and stroke who presents to the emergency room with about 3 days of ongoing intermittent mid sternal squeezing chest discomfort for the last 3 days, no aggravating or relieving factors.  Also has some epigastric pain.  This is associated with nausea and 1 episode of vomiting.  Frequency have been increasing so he came to the ER.  Patient also complains of leg swelling which is mostly chronic.  Patient was recently admitted to the hospital with ICD firing secondary to A-fib.  Settings were adjusted and sent home. Patient was recently taking doxycycline for his plantar wart. ED Course: Patient in the emergency room fairly stable.  On room air.  Renal functions at about baseline.  BNP 855.  High-sensitivity troponins less than 20.  Chest x-ray with mild interstitial edema.  EKG nonischemic.  Due to multiple risk factors, he was asked to be admitted.  Patient received 40 mg of IV Lasix before my exam. Patient had received multiple rounds of morphine injections, on my interview he had mild pain.  Fianc at the bedside. ICD interrogated in the emergency room showed evidence of rapid A-fib but no V. tach.  Review of Systems: all systems are reviewed and pertinent positive as per HPI otherwise rest are negative.    Past Medical History:  Diagnosis Date   AICD (automatic  cardioverter/defibrillator) present    Arthritis    CHF (congestive heart failure) (HCC)    Coronary artery disease    Defibrillator activation    Diabetes mellitus    Hypertension    Myocardial infarction [facility])    Sleep apnea    Stroke [facility])     Past Surgical History:  Procedure Laterality Date   ABDOMINAL AORTOGRAM W/LOWER EXTREMITY N/A 06/04/2023   Procedure: ABDOMINAL AORTOGRAM W/LOWER EXTREMITY;  Surgeon: Leonie Douglas, MD;  Location: [facility] INVASIVE CV LAB;  Service: Cardiovascular;  Laterality: N/A;   CORONARY ARTERY BYPASS GRAFT     FEMORAL-POPLITEAL BYPASS GRAFT Left 06/24/2023   Procedure: BYPASS GRAFT FEMORAL- ABOVE KNEE POPLITEAL ARTERY;  Surgeon: Leonie Douglas, MD;  Location: [facility] OR;  Service: Vascular;  Laterality: Left;   RIGHT/LEFT HEART CATH AND CORONARY/GRAFT ANGIOGRAPHY N/A 06/15/2023   Procedure: RIGHT/LEFT HEART CATH AND CORONARY/GRAFT ANGIOGRAPHY;  Surgeon: Tonny Bollman, MD;  Location: [facility] INVASIVE CV LAB;  Service: Cardiovascular;  Laterality: N/A;    Social history   reports that he quit smoking about 2 months ago. His smoking use included cigarettes. He started smoking about 35 years ago. He has a 17.5 pack-year smoking history. He does not have any smokeless tobacco history on file. He reports that he does not drink alcohol and does not use drugs.  Allergies  Allergen Reactions   Victoza [Liraglutide] Other (See Comments)    Gave pt pancreatitis    Family History  Problem Relation Age of Onset   Heart attack Mother    Heart attack Father  Heart attack Maternal Uncle      Prior to Admission medications   Medication Sig Start Date End Date Taking? Authorizing Provider  albuterol (PROVENTIL) (2.5 MG/3ML) 0.083% nebulizer solution Take 3 mLs (2.5 mg total) by nebulization every 6 (six) hours as needed for wheezing or shortness of breath. 08/26/23  Yes [name], MD  albuterol (VENTOLIN HFA) 108 (90 Base) MCG/ACT inhaler Inhale 1-2 puffs  into the lungs every 6 (six) hours as needed for wheezing or shortness of breath. 08/06/23  Yes [name], DO  apixaban (ELIQUIS) 5 MG TABS tablet Take 1 tablet (5 mg total) by mouth 2 (two) times daily. 07/25/23  Yes Parcells, Therisa Doyne, PA-C  aspirin 81 MG chewable tablet Chew 1 tablet (81 mg total) by mouth daily. 08/13/23  Yes Ollis, Brandi L, NP  atorvastatin (LIPITOR) 80 MG tablet Take 1 tablet (80 mg total) by mouth daily. 08/13/23  Yes Ollis, Luetta Nutting, NP  budesonide-formoterol (SYMBICORT) 80-4.5 MCG/ACT inhaler Inhale 2 puffs into the lungs 2 (two) times daily. 08/26/23  Yes [name], MD  Continuous Glucose Sensor (FREESTYLE LIBRE 3 PLUS SENSOR) MISC Change sensor every 15 days. 07/12/23  Yes [name], MD  doxycycline (VIBRA-TABS) 100 MG tablet Take 1 tablet (100 mg total) by mouth 2 (two) times daily. 09/22/23  Yes Regal, Kirstie Peri, DPM  empagliflozin (JARDIANCE) 25 MG TABS tablet Take 25 mg by mouth daily.   Yes [provider]  insulin glargine (LANTUS) 100 unit/mL SOPN Inject 15 Units into the skin at bedtime. 07/12/23  Yes [name], MD  metoprolol tartrate (LOPRESSOR) 25 MG tablet Take 1 tablet (25 mg total) by mouth 2 (two) times daily. 08/13/23  Yes Ollis, Brandi L, NP  nitroGLYCERIN (NITROSTAT) 0.4 MG SL tablet Place 0.4 mg under the tongue every 5 (five) minutes as needed for chest pain.   Yes [provider]  oxyCODONE-acetaminophen (PERCOCET) 10-325 MG tablet Take 1 tablet by mouth every 4 (four) hours as needed for pain.   Yes [provider]  pantoprazole (PROTONIX) 40 MG tablet Take 1 tablet (40 mg total) by mouth daily. 08/11/23  Yes Georgian Co M, PA-C  sotalol (BETAPACE) 160 MG tablet Take 1 tablet (160 mg total) by mouth 2 (two) times daily. 08/13/23  Yes Ollis, Luetta Nutting, NP  torsemide (DEMADEX) 20 MG tablet Take 1 tablet (20 mg total) by mouth daily as needed (swelling). 08/13/23  Yes [name], NP  Continuous Glucose  Receiver (FREESTYLE LIBRE 3 READER) DEVI UAD to monitor blood sugar 07/12/23   [name], MD  Fingerstix Lancets MISC Use as directed with testing supplies up to 4 times daily. 06/28/23   [name], [name], PA-C  glucose blood test strip Use as instructed 06/28/23   [name], [name], PA-C  Lancet Devices (TRUEDRAW LANCING DEVICE) MISC Use as directed up to four times daily 06/28/23   [name], [name], PA-C  valsartan (DIOVAN) 80 MG tablet Take 1 tablet (80 mg total) by mouth daily. Patient not taking: Reported on 09/28/2023 06/28/23   [name], PA-C    Physical Exam: Vitals:   09/28/23 0600 09/28/23 0929 09/28/23 1012 09/28/23 1100  BP: 132/75  132/81 123/73  Pulse: 62  64 (!) 59  Resp: 13   20  Temp:  97.7 F (36.5 C)    TempSrc:  Oral    SpO2: 100%   98%    Constitutional: NAD, calm, comfortable Vitals:   09/28/23 0600 09/28/23  1610 09/28/23 1012 09/28/23 1100  BP: 132/75  132/81 123/73  Pulse: 62  64 (!) 59  Resp: 13   20  Temp:  97.7 F (36.5 C)    TempSrc:  Oral    SpO2: 100%   98%   Eyes: PERRL, lids and conjunctivae normal ENMT: Mucous membranes are moist. Posterior pharynx clear of any exudate or lesions.Normal dentition.  Neck: normal, supple, no masses, no thyromegaly Respiratory: clear to auscultation bilaterally, no wheezing, no crackles. Normal respiratory effort. No accessory muscle use.  Cardiovascular: Regular rate and rhythm, no murmurs / rubs / gallops.  Bilateral pedal edema, left more than right.  2+ pedal pulses. No carotid bruits.  Abdomen: no tenderness, no masses palpated. No hepatosplenomegaly. Bowel sounds positive.  Musculoskeletal: no clubbing / cyanosis. No joint deformity upper and lower extremities. Good ROM, no contractures. Normal muscle tone.  Skin: no rashes, lesions, ulcers. No induration Neurologic: CN 2-12 grossly intact. Sensation intact, DTR normal. Strength 5/5 in all 4.  Psychiatric: Normal judgment and insight. Alert and  oriented x 3. Normal mood.  Patient has a dry plantar callus left foot.   Labs on Admission: I have personally reviewed following labs and imaging studies  CBC: Recent Labs  Lab 09/28/23 0049  WBC 7.4  HGB 15.3  HCT 48.8  MCV 96.3  PLT 136*   Basic Metabolic Panel: Recent Labs  Lab 09/28/23 0049  NA 137  K 4.0  CL 105  CO2 24  GLUCOSE 228*  BUN 21*  CREATININE 1.34*  CALCIUM 8.7*   GFR: Estimated Creatinine Clearance: 75.7 mL/min (A) (by C-G formula based on SCr of 1.34 mg/dL (H)). Liver Function Tests: Recent Labs  Lab 09/28/23 0053  AST 32  ALT 32  ALKPHOS 122  BILITOT 1.5*  PROT 6.6  ALBUMIN 3.5   Recent Labs  Lab 09/28/23 0053  LIPASE 24   No results for input(s): "AMMONIA" in the last 168 hours. Coagulation Profile: No results for input(s): "INR", "PROTIME" in the last 168 hours. Cardiac Enzymes: No results for input(s): "CKTOTAL", "CKMB", "CKMBINDEX", "TROPONINI" in the last 168 hours. BNP (last 3 results) No results for input(s): "PROBNP" in the last 8760 hours. HbA1C: No results for input(s): "HGBA1C" in the last 72 hours. CBG: Recent Labs  Lab 09/28/23 1249  GLUCAP 148*   Lipid Profile: No results for input(s): "CHOL", "HDL", "LDLCALC", "TRIG", "CHOLHDL", "LDLDIRECT" in the last 72 hours. Thyroid Function Tests: No results for input(s): "TSH", "T4TOTAL", "FREET4", "T3FREE", "THYROIDAB" in the last 72 hours. Anemia Panel: No results for input(s): "VITAMINB12", "FOLATE", "FERRITIN", "TIBC", "IRON", "RETICCTPCT" in the last 72 hours. Urine analysis:    Component Value Date/Time   COLORURINE STRAW (A) 11/01/2021 0136   APPEARANCEUR CLEAR 11/01/2021 0136   LABSPEC 1.025 11/01/2021 0136   PHURINE 5.0 11/01/2021 0136   GLUCOSEU >=500 (A) 11/01/2021 0136   HGBUR SMALL (A) 11/01/2021 0136   BILIRUBINUR NEGATIVE 11/01/2021 0136   KETONESUR NEGATIVE 11/01/2021 0136   PROTEINUR NEGATIVE 11/01/2021 0136   NITRITE NEGATIVE 11/01/2021 0136    LEUKOCYTESUR NEGATIVE 11/01/2021 0136    Radiological Exams on Admission: DG Chest Portable 1 View Result Date: 09/28/2023 CLINICAL DATA:  Shortness of breath and chest pain EXAM: PORTABLE CHEST 1 VIEW COMPARISON:  08/06/2023 FINDINGS: Stable cardiomegaly. Left chest wall ICD. Sternotomy and CABG. Interstitial coarsening in the lower lungs. Question trace bilateral pleural effusions. No pneumothorax. No focal consolidation. IMPRESSION: Cardiomegaly. Question mild interstitial edema and trace bilateral pleural effusions. Electronically  Signed   By: Minerva Fester M.D.   On: 09/28/2023 00:46    EKG: Independently reviewed.  Sinus rhythm, T wave abnormalities nonspecific,  Assessment/Plan Principal Problem:   Chest pain Active Problems:   Ischemic cardiomyopathy with implantable cardioverter-defibrillator (ICD)   Paroxysmal atrial fibrillation (HCC)   Hypertension   Type 2 diabetes mellitus (HCC)   History of CVA (cerebrovascular accident)     1.  Chest pain: Both typical and atypical features.  Patient with history of ischemic cardiomyopathy and previous stents. EKG and troponins are nonischemic.  Chest pain is responding to symptomatic management including morphine. Given his high risk factors, will work to rule out acute coronary syndrome. Resume aspirin, Eliquis, atorvastatin, metoprolol. Cardiology consultation. Differential diagnosis will include doxycycline induced reflux esophagitis.  Will increase dose of PPI and monitor.  2.  Acute on chronic combined heart failure:  Essential hypertension:  Hyperlipidemia:  Patient does have interstitial fluid in his lungs.  He has asymmetrical edema. Starting on IV Lasix.  Intake output monitoring.  Already on GDMT including Jardiance, valsartan, metoprolol.  Further management as per cardiology. Already on statin that will be continued.  3.  Type 2 diabetes: Well-controlled.  Resume home dose of insulin.  4.  History of VT/paroxysmal  A-fib: Currently in sinus rhythm.  He did go into A-fib as evidenced on interrogation.  No shock delivered. Rate controlled on metoprolol.  Continue sotalol.   DVT prophylaxis: Eliquis Code Status: Full code Family Communication: Fianc at the bedside Disposition Plan: Probable home Consults called: Cardiology Admission status: Telemetry monitor observation.   [name] MD [facility] Pager [phone]

## 2023-09-28 NOTE — TOC CM/SW Note (Signed)
 Transition of Care [facility] - Inpatient Brief Assessment   Patient Details  Name: Michael Barnett MRN: [id number] Date of Birth: 02-18-1971  Transition of Care [facility] CM/SW Contact:    Gala Lewandowsky, RN Phone Number: 09/28/2023, 4:03 PM   Clinical Narrative: Patient presented for chest pain. PTA patient states he is from home. Patient [facility] and states he gets to appointments without any issues. Patient has Medicaid and meds cost around $4.00. No home needs identified at this time.     Transition of Care Asessment: Insurance and Status: Insurance coverage has been reviewed Patient has primary care physician: Yes [facility] Home environment has been reviewed: reviewed Prior level of function:: independent Prior/Current Home Services: No current home services Social Drivers of Health Review: SDOH reviewed no interventions necessary Readmission risk has been reviewed: Yes Transition of care needs: no transition of care needs at this time

## 2023-09-28 NOTE — ED Triage Notes (Addendum)
 Pt c/o right side chest pain radiating across his chest ongoing for the past couple of days. Pain worsens after eating. Reports NV with mild sob. Bilateral LE edema; took nitroglycerinx2 prior to arrival with minimal improvement. Reports feeling his heart racing at times.

## 2023-09-28 NOTE — ED Notes (Signed)
 PT OTF with transport in no new onset distress at this time.

## 2023-09-29 ENCOUNTER — Observation Stay (HOSPITAL_COMMUNITY)

## 2023-09-29 DIAGNOSIS — Z9861 Coronary angioplasty status: Secondary | ICD-10-CM | POA: Diagnosis not present

## 2023-09-29 DIAGNOSIS — Z7984 Long term (current) use of oral hypoglycemic drugs: Secondary | ICD-10-CM | POA: Diagnosis not present

## 2023-09-29 DIAGNOSIS — Z7951 Long term (current) use of inhaled steroids: Secondary | ICD-10-CM | POA: Diagnosis not present

## 2023-09-29 DIAGNOSIS — I2489 Other forms of acute ischemic heart disease: Secondary | ICD-10-CM | POA: Diagnosis present

## 2023-09-29 DIAGNOSIS — I480 Paroxysmal atrial fibrillation: Secondary | ICD-10-CM | POA: Diagnosis present

## 2023-09-29 DIAGNOSIS — I5021 Acute systolic (congestive) heart failure: Secondary | ICD-10-CM | POA: Diagnosis not present

## 2023-09-29 DIAGNOSIS — R0789 Other chest pain: Secondary | ICD-10-CM | POA: Diagnosis not present

## 2023-09-29 DIAGNOSIS — I5043 Acute on chronic combined systolic (congestive) and diastolic (congestive) heart failure: Secondary | ICD-10-CM | POA: Diagnosis present

## 2023-09-29 DIAGNOSIS — Z87891 Personal history of nicotine dependence: Secondary | ICD-10-CM | POA: Diagnosis not present

## 2023-09-29 DIAGNOSIS — Z95810 Presence of automatic (implantable) cardiac defibrillator: Secondary | ICD-10-CM | POA: Diagnosis not present

## 2023-09-29 DIAGNOSIS — Z7982 Long term (current) use of aspirin: Secondary | ICD-10-CM | POA: Diagnosis not present

## 2023-09-29 DIAGNOSIS — I2510 Atherosclerotic heart disease of native coronary artery without angina pectoris: Secondary | ICD-10-CM | POA: Diagnosis present

## 2023-09-29 DIAGNOSIS — E785 Hyperlipidemia, unspecified: Secondary | ICD-10-CM | POA: Diagnosis present

## 2023-09-29 DIAGNOSIS — Z8673 Personal history of transient ischemic attack (TIA), and cerebral infarction without residual deficits: Secondary | ICD-10-CM | POA: Diagnosis not present

## 2023-09-29 DIAGNOSIS — R079 Chest pain, unspecified: Secondary | ICD-10-CM | POA: Diagnosis present

## 2023-09-29 DIAGNOSIS — E1151 Type 2 diabetes mellitus with diabetic peripheral angiopathy without gangrene: Secondary | ICD-10-CM | POA: Diagnosis present

## 2023-09-29 DIAGNOSIS — Z7901 Long term (current) use of anticoagulants: Secondary | ICD-10-CM | POA: Diagnosis not present

## 2023-09-29 DIAGNOSIS — I509 Heart failure, unspecified: Secondary | ICD-10-CM

## 2023-09-29 DIAGNOSIS — Z8249 Family history of ischemic heart disease and other diseases of the circulatory system: Secondary | ICD-10-CM | POA: Diagnosis not present

## 2023-09-29 DIAGNOSIS — Z79899 Other long term (current) drug therapy: Secondary | ICD-10-CM | POA: Diagnosis not present

## 2023-09-29 DIAGNOSIS — I110 Hypertensive heart disease with heart failure: Secondary | ICD-10-CM | POA: Diagnosis present

## 2023-09-29 DIAGNOSIS — I255 Ischemic cardiomyopathy: Secondary | ICD-10-CM | POA: Diagnosis present

## 2023-09-29 DIAGNOSIS — Z794 Long term (current) use of insulin: Secondary | ICD-10-CM | POA: Diagnosis not present

## 2023-09-29 DIAGNOSIS — I25810 Atherosclerosis of coronary artery bypass graft(s) without angina pectoris: Secondary | ICD-10-CM | POA: Diagnosis present

## 2023-09-29 DIAGNOSIS — G4733 Obstructive sleep apnea (adult) (pediatric): Secondary | ICD-10-CM | POA: Diagnosis present

## 2023-09-29 DIAGNOSIS — M1990 Unspecified osteoarthritis, unspecified site: Secondary | ICD-10-CM | POA: Diagnosis present

## 2023-09-29 DIAGNOSIS — I252 Old myocardial infarction: Secondary | ICD-10-CM | POA: Diagnosis not present

## 2023-09-29 DIAGNOSIS — I5033 Acute on chronic diastolic (congestive) heart failure: Secondary | ICD-10-CM | POA: Diagnosis not present

## 2023-09-29 DIAGNOSIS — I080 Rheumatic disorders of both mitral and aortic valves: Secondary | ICD-10-CM | POA: Diagnosis present

## 2023-09-29 DIAGNOSIS — I10 Essential (primary) hypertension: Secondary | ICD-10-CM | POA: Diagnosis not present

## 2023-09-29 LAB — ECHOCARDIOGRAM COMPLETE
Area-P 1/2: 4.68 cm2
Height: 69 in
MV M vel: 4.56 m/s
MV Peak grad: 83.2 mmHg
Radius: 0.5 cm
S' Lateral: 4.6 cm
Weight: 3564.8 [oz_av]

## 2023-09-29 LAB — BASIC METABOLIC PANEL WITH GFR
Anion gap: 7 (ref 5–15)
Anion gap: 8 (ref 5–15)
BUN: 23 mg/dL — ABNORMAL HIGH (ref 6–20)
BUN: 20 mg/dL (ref 6–20)
CO2: 26 mmol/L (ref 22–32)
CO2: 26 mmol/L (ref 22–32)
Calcium: 8.4 mg/dL — ABNORMAL LOW (ref 8.9–10.3)
Calcium: 8.3 mg/dL — ABNORMAL LOW (ref 8.9–10.3)
Chloride: 104 mmol/L (ref 98–111)
Chloride: 105 mmol/L (ref 98–111)
Creatinine, Ser: 1.26 mg/dL — ABNORMAL HIGH (ref 0.61–1.24)
Creatinine, Ser: 1.14 mg/dL (ref 0.61–1.24)
GFR, Estimated: >60 mL/min (ref >60)
GFR, Estimated: >60 mL/min (ref >60)
Glucose, Bld: 160 mg/dL — ABNORMAL HIGH (ref 70–99)
Glucose, Bld: 117 mg/dL — ABNORMAL HIGH (ref 70–99)
Potassium: 3.8 mmol/L (ref 3.5–5.1)
Potassium: 3.6 mmol/L (ref 3.5–5.1)
Sodium: 137 mmol/L (ref 135–145)
Sodium: 139 mmol/L (ref 135–145)

## 2023-09-29 LAB — GLUCOSE, CAPILLARY
Glucose-Capillary: 122 mg/dL — ABNORMAL HIGH (ref 70–99)
Glucose-Capillary: 207 mg/dL — ABNORMAL HIGH (ref 70–99)
Glucose-Capillary: 188 mg/dL — ABNORMAL HIGH (ref 70–99)
Glucose-Capillary: 211 mg/dL — ABNORMAL HIGH (ref 70–99)

## 2023-09-29 LAB — MAGNESIUM: Magnesium: 1.9 mg/dL (ref 1.7–2.4)

## 2023-09-29 MED ORDER — SPIRONOLACTONE 25 MG PO TABS
25.0000 mg | ORAL_TABLET | Freq: Every day | ORAL | Status: DC
  Administered 2023-09-29 – 2023-09-30 (×2): 25 mg via ORAL
  Filled 2023-09-29 (×2): qty 1

## 2023-09-29 MED ORDER — POTASSIUM CHLORIDE CRYS ER 20 MEQ PO TBCR
40.0000 meq | EXTENDED_RELEASE_TABLET | Freq: Once | ORAL | Status: AC
  Administered 2023-09-29: 40 meq via ORAL
  Filled 2023-09-29: qty 2

## 2023-09-29 MED ORDER — POTASSIUM CHLORIDE 20 MEQ PO PACK: 40.0000 meq | PACK | Freq: Once | ORAL | Status: DC

## 2023-09-29 MED ORDER — PERFLUTREN LIPID MICROSPHERE
1.0000 mL | INTRAVENOUS | Status: AC | PRN
  Administered 2023-09-29: 3 mL via INTRAVENOUS

## 2023-09-29 MED ORDER — METHOCARBAMOL 500 MG PO TABS
500.0000 mg | ORAL_TABLET | Freq: Three times a day (TID) | ORAL | Status: DC | PRN
  Administered 2023-09-29: 500 mg via ORAL
  Filled 2023-09-29 (×2): qty 1

## 2023-09-29 MED ORDER — FUROSEMIDE 40 MG PO TABS: 40.0000 mg | ORAL_TABLET | Freq: Every day | ORAL | Status: DC

## 2023-09-29 NOTE — Plan of Care (Signed)

## 2023-09-29 NOTE — Progress Notes (Signed)
 Heart Failure Stewardship Pharmacist Progress Note   PCP: [name], MD PCP-Cardiologist: Orbie Pyo, MD    HPI: 53 yo M with PMH of ischemic cardiomyopathy s/p ICD placement 04/2011 , VT s/p ablation 10/2022, PVC, pAF, PAD s/p left femoral-popliteal bypass, multivessel CAD s/p CABG in 2013 with PCI to RCA 2016 and cath 10/2020 showing chronic MV CAD w/o revasculariation options, HTN, T2DM, CVA, CHF   Patient recently hospitalized from 1/25-26/2025 for ventricular tachycardia vs afib post ICD firing and volume overload. Patient was subsequently started on Apixaban for suspected AF with RVR and Metoprolol for rate control.  Presented to the ED on 4/1 with worsening  BL LE edema, right side chest pain that radiates across chest for the past 3 days - worsens after eating. Patient also reports NV and abdominal discomfort. Prior to ED, patient took NTG x2 with minimal improvement. In the ED, patient was mildly hypoxic and was diuresed with IV lasix. ICD interrogation in the ED showed evidence of rapid A-fib, brief episodes of SVT; functioning normally. No JVD.  CXR 4/2 showed stable cardiomegaly, left chest wall ICD, and questionable trace BL pleural effusions. Bibasilar crackles on exam. ECHO 4/2 showed LVEF 30-35% (reduced from 05/2023 45-50%) with LV regional wall motion abnormalities, asymmetric LV hypertrophy, grade 3 LV diastolic dysfunction. RV systolic function mildly reduced. Degenerate mitral valve with moderate regurgitation. L/RHC from 05/2023: stable CAD based on comparison from prior cath report. Cardiology consulted and notes CP appearing musculoskeletal and not suggesting angina.   Patient denies shortness of breath, chest pain, or orthopnea. LE edema present on exam. Patient reports that fluid returns quickly after being diuresed with IV lasix and that fluid begins to pool in legs when walking. Reviewed current discharge medications thus far and answered all questions.    Patient is interested in compression socks to help reduce leg swelling. Family member present in the room, expressed that oral PTA diuretic is "not working" due to leg swelling returning quickly after receiving IV lasix during recent hospital discharge. They have reported this issue to PCP and cardiologist but is now interested in alternative diuretic options. Patient is uncertain if currently taking valsartan but does report daily episodes of dizziness that occur while sitting vs going from standing to sitting position. Episodes started "a while ago". Patient does not have a BP cuff at home. Interested in [facility] pharmacy and mail order pharmacy refills after discharge.   Current HF Medications: Diuretic: PO Lasix 40mg  daily Beta Blocker: Metoprolol Succinate 25mg  daily  ACE/ARB/ARNI: Irbesartan 37.5mg  daily MRA: Spironolactone 25mg  daily SGLT2i: Jardiance 25mg  daily  Prior to admission HF Medications: Diuretic: Torsemide 20mg  daily PRN swelling Beta blocker: Metoprolol Tartrate 25mg  BID ACE/ARB/ARNI: Valsartan 80mg  daily - previously holding due to hypotension/lightheadedness  SGLT2i: Jardiance 25mg  daily  Pertinent Lab Values: Serum creatinine 1.14>1.29 (BL 1-1.1), BUN 24, Potassium 3.8, Sodium 139, BNP 855, Magnesium 1.9, A1c 9.1  Vital Signs: Weight: n/a lbs (admission weight: 222 lbs) Blood pressure: 99/50-110-130s/60-80s  Heart rate: 50-60s  I/O: incomplete   Medication Assistance / Insurance Benefits Check: Does the patient have prescription insurance?  Yes Type of insurance plan: Medicaid   Outpatient Pharmacy:  Prior to admission outpatient pharmacy: [facility] [address] [place], [place] [address]   Is the patient willing to use [facility] pharmacy at discharge? Yes Is the patient willing to transition their outpatient pharmacy to utilize a [facility] outpatient pharmacy?   Yes, at [facility] pharmacy    Assessment:  1. Acute on chronic systolic CHF (LVEF 30-35%), due to ischemic  cardiomyopathy. NYHA class 2 symptoms. May need to restart PO lasix if still appearing volume overloaded. May conduct outpatient cath. - Continue PO lasix 40mg  daily - Consider adding PRN metolazone at discharge and may need SQ furoscix at follow up if volume overloaded at that visit - Consider deferring Valsartan to [facility] discharge plans given episodes of lightheadedness/dizziness - Consider holding jardiance if renal function continues to worsen - Strict I/Os, Keep Mg >2, K >4   Plan: 1) Medication changes recommended at this time: - F/u Mg and K post replacement - Off IV Lasix, agree with starting PO lasix for now  - Monitor renal function  - Order compression socks and BP cuff upon discharge  - Continue Irbesartan 37.5mg  daily  - Continue Metoprolol Succinate 25mg  daily  - Continue Spironolactone 25mg  daily - Continue Jardiance 25mg  daily   3)  Education  - Patient has been educated on current HF medications and potential additions to HF medication regimen - Patient verbalizes understanding that over the next few months, these medication doses may change and more medications may be added to optimize HF regimen - Patient has been educated on basic disease state pathophysiology and goals of therapy  - Educated on  alternative diuretic options of furoscix SQ or metolazone and counseled on . Will discuss with cardiologist to determine best option for patient  - Educated patient to check BP when experiencing signs of dizziness/lightheadedness  Verdene Rio, PharmD PGY1 Pharmacy Resident

## 2023-09-29 NOTE — Progress Notes (Signed)
 PROGRESS NOTE    Michael Barnett  JYN:[id number] DOB: 09-22-1970 DOA: 09/28/2023 PCP: [name], MD    Brief Narrative:  53 year old with history of ischemic cardiomyopathy with improved ejection fraction, chronic combined heart failure, coronary artery disease status post CABG in 2013 and multiple PCI after that.  VT ablation on sotalol and metoprolol.  Hypertension, diabetes, peripheral artery disease, sleep apnea and stroke who presents to the emergency room with about 3 days of ongoing intermittent mid sternal squeezing chest discomfort for the last 3 days, no aggravating or relieving factors.  Also has some epigastric pain.  This is associated with nausea and 1 episode of vomiting.  Frequency have been increasing so he came to the ER.  Patient also complains of leg swelling which is mostly chronic.  Patient was recently admitted to the hospital with ICD firing secondary to A-fib.  Settings were adjusted and sent home. In the emergency room, troponins and EKG nonischemic.  BNP 855.  Admitted with cardiology consultation.  Treated with IV diuresis.  Subjective: Patient seen in the morning rounds.  He was trying to walk around.  No overnight events noted.  Patient denies any shortness of breath but he still has intermittent squeezing sensation on his midsternal region.  Leg swelling has slightly improved.   Assessment & Plan:   Atypical chest pain in a patient with extensive coronary artery disease, bypass.  EKG and troponins nonischemic.  Seen by cardiology.  Currently no evidence of acute coronary syndrome.  Symptomatic management.  Echocardiogram pending. Resume aspirin, Eliquis, atorvastatin, metoprolol. Increasing dose of PPI.   Acute on chronic combined heart failure:  Essential hypertension:  Hyperlipidemia:  Patient does have interstitial fluid in his lungs.  He has asymmetrical edema. Remains on IV Lasix.  Intake output monitoring.  Already on GDMT including Jardiance,  valsartan, metoprolol.  Further management as per cardiology. Already on statin that will be continued. Compression socks.   Type 2 diabetes: Well-controlled.  Resume home dose of insulin.   History of VT/paroxysmal A-fib: Currently in sinus rhythm.  He did go into A-fib as evidenced on interrogation.  No shock delivered. Rate controlled on metoprolol.  Continue sotalol.   DVT prophylaxis:  apixaban (ELIQUIS) tablet 5 mg   Code Status: Full code Family Communication: Fianc at the bedside Disposition Plan: Status is: Observation The patient will require care spanning > 2 midnights and should be moved to inpatient because: IV diuresis     Consultants:  Cardiology  Procedures:  None  Antimicrobials:  None     Objective: Vitals:   09/28/23 1444 09/28/23 1628 09/28/23 1947 09/29/23 0449  BP: 115/68 122/73 (!) 107/51 (!) 99/50  Pulse: (!) 124 61  61  Resp: 19 18 18 18   Temp: 98 F (36.7 C) 98.5 F (36.9 C) 98 F (36.7 C) 98.1 F (36.7 C)  TempSrc: Oral Oral Oral Oral  SpO2:   100% 100%  Weight: 101.1 kg     Height: 5\' 9"  (1.753 m)      No intake or output data in the 24 hours ending 09/29/23 1101 Filed Weights   09/28/23 1444  Weight: 101.1 kg    Examination:  General exam: Appears calm and comfortable.  Looks fairly comfortable today. Respiratory system: Clear to auscultation. Respiratory effort normal. Cardiovascular system: S1 & S2 heard, RRR. No JVD, murmurs, rubs, gallops or clicks.  Gastrointestinal system: Abdomen is nondistended, soft and nontender. No organomegaly or masses felt. Normal bowel sounds heard. Central nervous system:  Alert and oriented. No focal neurological deficits. Extremities: Symmetric 5 x 5 power. Skin: No rashes, lesions or ulcers Psychiatry: Judgement and insight appear normal. Mood & affect appropriate.  Asymmetrical leg edema.  Left leg 2+, right leg 1+.    Data Reviewed: I have personally reviewed following labs and  imaging studies  CBC: Recent Labs  Lab 09/28/23 0049  WBC 7.4  HGB 15.3  HCT 48.8  MCV 96.3  PLT 136*   Basic Metabolic Panel: Recent Labs  Lab 09/28/23 0049 09/29/23 0035 09/29/23 0437  NA 137 137 139  K 4.0 3.8 3.6  CL 105 104 105  CO2 24 26 26   GLUCOSE 228* 160* 117*  BUN 21* 23* 20  CREATININE 1.34* 1.26* 1.14  CALCIUM 8.7* 8.4* 8.3*  MG  --   --  1.9   GFR: Estimated Creatinine Clearance: 87.9 mL/min (by C-G formula based on SCr of 1.14 mg/dL). Liver Function Tests: Recent Labs  Lab 09/28/23 0053  AST 32  ALT 32  ALKPHOS 122  BILITOT 1.5*  PROT 6.6  ALBUMIN 3.5   Recent Labs  Lab 09/28/23 0053  LIPASE 24   No results for input(s): "AMMONIA" in the last 168 hours. Coagulation Profile: No results for input(s): "INR", "PROTIME" in the last 168 hours. Cardiac Enzymes: No results for input(s): "CKTOTAL", "CKMB", "CKMBINDEX", "TROPONINI" in the last 168 hours. BNP (last 3 results) No results for input(s): "PROBNP" in the last 8760 hours. HbA1C: No results for input(s): "HGBA1C" in the last 72 hours. CBG: Recent Labs  Lab 09/28/23 1249 09/28/23 1630 09/28/23 2101 09/29/23 0740  GLUCAP 148* 205* 244* 122*   Lipid Profile: No results for input(s): "CHOL", "HDL", "LDLCALC", "TRIG", "CHOLHDL", "LDLDIRECT" in the last 72 hours. Thyroid Function Tests: No results for input(s): "TSH", "T4TOTAL", "FREET4", "T3FREE", "THYROIDAB" in the last 72 hours. Anemia Panel: No results for input(s): "VITAMINB12", "FOLATE", "FERRITIN", "TIBC", "IRON", "RETICCTPCT" in the last 72 hours. Sepsis Labs: No results for input(s): "PROCALCITON", "LATICACIDVEN" in the last 168 hours.  No results found for this or any previous visit (from the past 240 hours).       Radiology Studies: DG Chest Portable 1 View Result Date: 09/28/2023 CLINICAL DATA:  Shortness of breath and chest pain EXAM: PORTABLE CHEST 1 VIEW COMPARISON:  08/06/2023 FINDINGS: Stable cardiomegaly. Left  chest wall ICD. Sternotomy and CABG. Interstitial coarsening in the lower lungs. Question trace bilateral pleural effusions. No pneumothorax. No focal consolidation. IMPRESSION: Cardiomegaly. Question mild interstitial edema and trace bilateral pleural effusions. Electronically Signed   By: Minerva Fester M.D.   On: 09/28/2023 00:46        Scheduled Meds:  apixaban  5 mg Oral BID   aspirin  81 mg Oral Daily   atorvastatin  80 mg Oral Daily   empagliflozin  25 mg Oral Daily   furosemide  40 mg Intravenous BID   insulin aspart  0-5 Units Subcutaneous QHS   insulin aspart  0-9 Units Subcutaneous TID WC   insulin glargine-yfgn  15 Units Subcutaneous QHS   metoprolol tartrate  25 mg Oral BID   mometasone-formoterol  2 puff Inhalation BID   pantoprazole  40 mg Oral BID   sotalol  160 mg Oral BID   Continuous Infusions:   LOS: 0 days    Time spent: 40 minutes    [name], MD [facility]

## 2023-09-29 NOTE — Progress Notes (Signed)
  Echocardiogram 2D Echocardiogram has been performed.  Leda Roys RDCS 09/29/2023, 11:41 AM

## 2023-09-29 NOTE — Progress Notes (Addendum)
 Patient Name: Michael Barnett Date of Encounter: 09/29/2023 [facility] Cardiologist: Orbie Pyo, MD   Interval Summary  .    Shortness of breath and lower extremity edema have improved. Was on 3L [place] during the visit. Continues to have chest pain that feels similar to the pain he had earlier in the ER. Had some muscle spasms overnight.  Vital Signs .    Vitals:   09/28/23 1444 09/28/23 1628 09/28/23 1947 09/29/23 0449  BP: 115/68 122/73 (!) 107/51 (!) 99/50  Pulse: (!) 124 61  61  Resp: 19 18 18 18   Temp: 98 F (36.7 C) 98.5 F (36.9 C) 98 F (36.7 C) 98.1 F (36.7 C)  TempSrc: Oral Oral Oral Oral  SpO2:   100% 100%  Weight: 101.1 kg     Height: 5\' 9"  (1.753 m)      No intake or output data in the 24 hours ending 09/29/23 0725    09/28/2023    2:44 PM 09/22/2023    8:58 AM 08/13/2023    9:39 AM  Last 3 Weights  Weight (lbs) 222 lb 12.8 oz 229 lb 229 lb  Weight (kg) 101.061 kg 103.874 kg 103.874 kg      Telemetry/ECG    Sinus bradycardia with rates in the high 50's and low 60's - Personally Reviewed  Physical Exam .   GEN: No acute distress.   Neck: No JVD Cardiac: RRR, no murmurs, rubs, or gallops.  Respiratory: bibasilar crackles. MS: 2+ edema  Assessment & Plan .     Acute on chronic heart failure with moderately reduced ejection fraction Ischemic cardiomyopathy with improved LVEF - presented with chest pain, leg edema, heart palpitation, dizziness over the past 3 days  - BNP 855 (was 1067 on 08/06/23) - CXR concern for edema  - Last echo on 05/2023 showed LVEF 45 to 50%, grade 3 diastolic dysfunction, normal RV, moderate LAE, mild RAE, mild MR, aortic sclerosis, akinetic inferior wall, hypokinetic posterior wall.  - R/L heat cath 05/2023 as below  - repeat Echo scheduled - PTA (prior to admission) on PRN torsemide 20mg  BID 3-4 times weekly, was hypervolemic and IV Lasix 40mg  BID was started, no I/O recorded but pt reported good urine output.  Continues to be hypervolemic. Cr improved to baseline (1.14), potassium 3.6, ordered replacement. Ordered magnesium lab. - Continue IV Lasix 40mg  BID.  - GDMT:  PTA  continue to hold valsartan for BP concerns (BP 99/50 overnight, 121/68 this am), May consider adding spironolactone after BP's improve; continue jardiance, would change metoprolol 25mg  BID to Toprol XL at discharge    Chest pain Multivessel CAD s/p CABG 2013 with LIMA to LAD and SVG to OM and RCA PCI 2016 - hx of chronic atypical chest pain per [facility] records  - presented with 3 days onset of resting self limiting chest pain, associated with racing heartbeat, nausea, leg edema, SOB - L/R heart cath 06/15/2023 with severe native multivessel CAD, chronic occlusion of SVG to OM and patent LIMA to LAD, stable anatomy and no target for revascularization;  elevated diastolic filling pressures with PA 37, wedge 19, Transpulmonary gradient 18 mmHg with PVR 3 [place] units, CO 5.9L/min.  - Hs trop negative x2, EKG no acute changes  - suspect demand ischemia from decompensated CHF, ACS ruled out, continues to have anginal symptoms.  - continue medical therapy with PTA ASA 81mg , lipitor 80mg , metoprolol 25mg  BID. - hold valsartan 80mg  for hypotension    Heart  palpitation  Dizziness/pre-syncope  Paroxysmal A fib CHA2DS2-VASc Score = 6 [CHF History: 1, HTN History: 1, Diabetes History: 1, Stroke History: 2, Vascular Disease History: 1, Age Score: 0, Gender Score: 0].  Therefore, the patient's annual risk of stroke is 9.7 %.     - ICD interrogation by EP 07/24/2023 hospitalization was felt consistent with A fib RVR with aberrancy triggering ICD inappropriate shock, he was maintained on sotalol, added metoprolol for rate control and eliquis for anticoagulation. 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   HLD - continue lipitor 80mg   DM Hx of CVA  PAD  OSA - per primary team  For questions or updates, please contact [facility] Please consult [email or web address] for contact info under        Signed, Arabella Merles, PA-C

## 2023-09-30 ENCOUNTER — Other Ambulatory Visit (HOSPITAL_COMMUNITY): Payer: Self-pay

## 2023-09-30 ENCOUNTER — Encounter (HOSPITAL_COMMUNITY): Payer: Self-pay | Admitting: Internal Medicine

## 2023-09-30 DIAGNOSIS — I10 Essential (primary) hypertension: Secondary | ICD-10-CM

## 2023-09-30 DIAGNOSIS — R0789 Other chest pain: Secondary | ICD-10-CM | POA: Diagnosis not present

## 2023-09-30 DIAGNOSIS — I480 Paroxysmal atrial fibrillation: Secondary | ICD-10-CM | POA: Diagnosis not present

## 2023-09-30 DIAGNOSIS — I5033 Acute on chronic diastolic (congestive) heart failure: Secondary | ICD-10-CM | POA: Diagnosis not present

## 2023-09-30 LAB — BASIC METABOLIC PANEL WITH GFR
Anion gap: 10 (ref 5–15)
BUN: 24 mg/dL — ABNORMAL HIGH (ref 6–20)
CO2: 25 mmol/L (ref 22–32)
Calcium: 8.3 mg/dL — ABNORMAL LOW (ref 8.9–10.3)
Chloride: 104 mmol/L (ref 98–111)
Creatinine, Ser: 1.26 mg/dL — ABNORMAL HIGH (ref 0.61–1.24)
GFR, Estimated: >60 mL/min (ref >60)
Glucose, Bld: 147 mg/dL — ABNORMAL HIGH (ref 70–99)
Potassium: 3.8 mmol/L (ref 3.5–5.1)
Sodium: 139 mmol/L (ref 135–145)

## 2023-09-30 LAB — MAGNESIUM: Magnesium: 1.9 mg/dL (ref 1.7–2.4)

## 2023-09-30 LAB — GLUCOSE, CAPILLARY
Glucose-Capillary: 164 mg/dL — ABNORMAL HIGH (ref 70–99)
Glucose-Capillary: 195 mg/dL — ABNORMAL HIGH (ref 70–99)

## 2023-09-30 MED ORDER — SPIRONOLACTONE 25 MG PO TABS
25.0000 mg | ORAL_TABLET | Freq: Every day | ORAL | 0 refills | Status: DC
  Filled 2023-09-30: qty 90, 90d supply, fill #0

## 2023-09-30 MED ORDER — FUROSEMIDE 40 MG PO TABS
40.0000 mg | ORAL_TABLET | Freq: Every day | ORAL | Status: DC
  Administered 2023-09-30: 40 mg via ORAL
  Filled 2023-09-30: qty 1

## 2023-09-30 MED ORDER — FUROSEMIDE 40 MG PO TABS
40.0000 mg | ORAL_TABLET | Freq: Every day | ORAL | 0 refills | Status: DC
  Filled 2023-09-30: qty 30, 30d supply, fill #0

## 2023-09-30 MED ORDER — POTASSIUM CHLORIDE CRYS ER 20 MEQ PO TBCR
20.0000 meq | EXTENDED_RELEASE_TABLET | Freq: Every day | ORAL | 0 refills | Status: DC
  Filled 2023-09-30: qty 30, 30d supply, fill #0

## 2023-09-30 MED ORDER — METOPROLOL SUCCINATE ER 25 MG PO TB24
25.0000 mg | ORAL_TABLET | Freq: Every day | ORAL | Status: DC
  Administered 2023-09-30: 25 mg via ORAL
  Filled 2023-09-30: qty 1

## 2023-09-30 MED ORDER — METOPROLOL SUCCINATE ER 25 MG PO TB24
25.0000 mg | ORAL_TABLET | Freq: Every day | ORAL | 0 refills | Status: DC
  Filled 2023-09-30: qty 90, 90d supply, fill #0

## 2023-09-30 MED ORDER — IRBESARTAN 75 MG PO TABS
37.5000 mg | ORAL_TABLET | Freq: Every day | ORAL | 2 refills | Status: DC
  Filled 2023-09-30: qty 45, 90d supply, fill #0

## 2023-09-30 MED ORDER — MAGNESIUM SULFATE 2 GM/50ML IV SOLN
2.0000 g | Freq: Once | INTRAVENOUS | Status: AC
  Administered 2023-09-30: 2 g via INTRAVENOUS
  Filled 2023-09-30: qty 50

## 2023-09-30 MED ORDER — IRBESARTAN 75 MG PO TABS
37.5000 mg | ORAL_TABLET | Freq: Every day | ORAL | Status: DC
  Administered 2023-09-30: 37.5 mg via ORAL
  Filled 2023-09-30: qty 1

## 2023-09-30 MED ORDER — VALSARTAN 80 MG PO TABS
80.0000 mg | ORAL_TABLET | Freq: Every day | ORAL | 0 refills | Status: DC
  Filled 2023-09-30: qty 90, 90d supply, fill #0

## 2023-09-30 MED ORDER — VALSARTAN 40 MG PO TABS
40.0000 mg | ORAL_TABLET | Freq: Every day | ORAL | 0 refills | Status: DC
Start: 2023-09-30 — End: 2023-10-06
  Filled 2023-09-30: qty 90, 90d supply, fill #0

## 2023-09-30 MED ORDER — BLOOD PRESSURE KIT
1.0000 [IU] | PACK | 0 refills | Status: DC | PRN
  Filled 2023-09-30: qty 1, fill #0

## 2023-09-30 MED ORDER — APIXABAN 5 MG PO TABS
5.0000 mg | ORAL_TABLET | Freq: Two times a day (BID) | ORAL | Status: DC
  Administered 2023-09-30: 5 mg via ORAL
  Filled 2023-09-30: qty 1

## 2023-09-30 MED ORDER — METOLAZONE 2.5 MG PO TABS
2.5000 mg | ORAL_TABLET | Freq: Every day | ORAL | 0 refills | Status: DC | PRN
  Filled 2023-09-30: qty 30, 30d supply, fill #0

## 2023-09-30 MED ORDER — POTASSIUM CHLORIDE CRYS ER 20 MEQ PO TBCR
40.0000 meq | EXTENDED_RELEASE_TABLET | Freq: Once | ORAL | Status: AC
  Administered 2023-09-30: 40 meq via ORAL
  Filled 2023-09-30: qty 2

## 2023-09-30 NOTE — Plan of Care (Signed)

## 2023-09-30 NOTE — Discharge Summary (Addendum)
 Physician Discharge Summary  Michael Barnett NGE:[id number] DOB: [birth year] DOA: 09/28/2023  PCP: [name], MD  Admit date: 09/28/2023 Discharge date: 09/30/2023  Admitted From: Home Disposition: Home  Recommendations for Outpatient Follow-up:  Follow up with PCP in 1-2 weeks Please obtain BMP/CBC in one week Cardiology to schedule follow-up  Home Health: N/A Equipment/Devices: N/A  Discharge Condition: Stable CODE STATUS: Full code Diet recommendation: Low-salt and low-carb diet  Discharge summary: 53 year old with history of ischemic cardiomyopathy with improved ejection fraction, chronic combined heart failure, coronary artery disease status post CABG in 2013 and multiple PCI after that.  VT ablation on sotalol and metoprolol.  Hypertension, diabetes, peripheral artery disease, sleep apnea and stroke who presents to the emergency room with about 3 days of ongoing intermittent mid sternal squeezing chest discomfort for the last 3 days, no aggravating or relieving factors.  Also has some epigastric pain.  This is associated with nausea and 1 episode of vomiting.  Frequency have been increasing so he came to the ER.  Patient also complains of leg swelling which is mostly chronic.  Patient was recently admitted to the hospital with ICD firing secondary to A-fib.  Settings were adjusted and sent home. In the emergency room, troponins and EKG nonischemic.  BNP 855.  Admitted with cardiology consultation.  Treated with IV diuresis.  His repeat ejection fraction was 35%.  He received IV diuresis and adjustment of his cardiac medications.  Atypical chest pain in a patient with extensive coronary artery disease, bypass.  EKG and troponins nonischemic.  Seen by cardiology.  Currently no evidence of acute coronary syndrome.  Symptomatic management.  Echocardiogram with reduced ejection fraction, ischemic cardiomyopathy with regional wall motion abnormalities. Resume aspirin, Eliquis,  atorvastatin, metoprolol. Chest pain improved now. Patient with extensive coronary artery disease status post bypass, artery is not amenable to stenting or surgery.  May benefit with heart transplant.  Cardiology service to do referral.   Acute on chronic combined heart failure:  Essential hypertension:  Hyperlipidemia:  Patient did present with evidence of interstitial fluid in his lungs.  He has asymmetrical edema. Treated with IV Lasix with clinical improvement. Going home with oral Lasix 40 mg daily, Jardiance, Toprol XL, valsartan 40 mg daily, metolazone 2.5 mg as needed. Seen by cardiology.  Currently clinically stabilized.  He will have outpatient follow-up. Advised compression socks.   Type 2 diabetes: Well-controlled.  Resume home dose of insulin on discharge.  Already on Jardiance.   History of VT/paroxysmal A-fib: Currently in sinus rhythm.  He did go into A-fib as evidenced on interrogation.  No shock delivered. Rate controlled on metoprolol.  Continue sotalol.  Therapeutic on Eliquis.   Stable for discharge.    Discharge Diagnoses:  Principal Problem:   Musculoskeletal chest pain Active Problems:   Ischemic cardiomyopathy with implantable cardioverter-defibrillator (ICD)   Paroxysmal atrial fibrillation (HCC)   Hypertension   Type 2 diabetes mellitus (HCC)   History of CVA (cerebrovascular accident)   Acute on chronic heart failure with preserved ejection fraction (HCC)   CHF exacerbation [facility])    Discharge Instructions  Discharge Instructions     Diet - low sodium heart healthy   Complete by: As directed    Diet Carb Modified   Complete by: As directed    Discharge instructions   Complete by: As directed    Compression stockings both legs   Increase activity slowly   Complete by: As directed       Allergies as of  09/30/2023       Reactions   Victoza [liraglutide] Other (See Comments)   Gave pt pancreatitis        Medication List     STOP  taking these medications    doxycycline 100 MG tablet Commonly known as: VIBRA-TABS   metoprolol tartrate 25 MG tablet Commonly known as: LOPRESSOR   torsemide 20 MG tablet Commonly known as: DEMADEX       TAKE these medications    albuterol 108 (90 Base) MCG/ACT inhaler Commonly known as: VENTOLIN HFA Inhale 1-2 puffs into the lungs every 6 (six) hours as needed for wheezing or shortness of breath.   albuterol (2.5 MG/3ML) 0.083% nebulizer solution Commonly known as: PROVENTIL Take 3 mLs (2.5 mg total) by nebulization every 6 (six) hours as needed for wheezing or shortness of breath.   apixaban 5 MG Tabs tablet Commonly known as: Eliquis Take 1 tablet (5 mg total) by mouth 2 (two) times daily.   aspirin 81 MG chewable tablet Chew 1 tablet (81 mg total) by mouth daily.   atorvastatin 80 MG tablet Commonly known as: LIPITOR Take 1 tablet (80 mg total) by mouth daily.   budesonide-formoterol 80-4.5 MCG/ACT inhaler Commonly known as: SYMBICORT Inhale 2 puffs into the lungs 2 (two) times daily.   empagliflozin 25 MG Tabs tablet Commonly known as: JARDIANCE Take 25 mg by mouth daily.   FreeStyle Libre 3 Plus Sensor Misc Change sensor every 15 days.   FreeStyle Libre 3 Reader Devi UAD to monitor blood sugar   furosemide 40 MG tablet Commonly known as: LASIX Take 1 tablet (40 mg total) by mouth daily. Start taking on: October 01, 2023   insulin glargine 100 unit/mL Sopn Commonly known as: LANTUS Inject 15 Units into the skin at bedtime.   metolazone 2.5 MG tablet Commonly known as: ZAROXOLYN Take 1 tablet (2.5 mg total) by mouth daily as needed (increasing leg swelling, shortness of breath or weight gain > 3lbs overnight).   metoprolol succinate 25 MG 24 hr tablet Commonly known as: TOPROL-XL Take 1 tablet (25 mg total) by mouth daily. Start taking on: October 01, 2023   nitroGLYCERIN 0.4 MG SL tablet Commonly known as: NITROSTAT Place 0.4 mg under the tongue  every 5 (five) minutes as needed for chest pain.   oxyCODONE-acetaminophen 10-325 MG tablet Commonly known as: PERCOCET Take 1 tablet by mouth every 4 (four) hours as needed for pain.   pantoprazole 40 MG tablet Commonly known as: PROTONIX Take 1 tablet (40 mg total) by mouth daily.   sotalol 160 MG tablet Commonly known as: BETAPACE Take 1 tablet (160 mg total) by mouth 2 (two) times daily.   spironolactone 25 MG tablet Commonly known as: ALDACTONE Take 1 tablet (25 mg total) by mouth daily. Start taking on: October 01, 2023   True Metrix Blood Glucose Test test strip Generic drug: glucose blood Use as instructed   TRUEdraw Lancing Device Misc Use as directed up to four times daily   TRUEplus Lancets 28G Misc Use as directed with testing supplies up to 4 times daily.   valsartan 80 MG tablet Commonly known as: DIOVAN Take 1 tablet (80 mg total) by mouth daily.        Follow-up Information     [facility]. Go in 6 day(s).   Specialty: Cardiology Why: Hospital follow up 10/06/2023 @ 11 am PLEASE bring a current medication list to appointment FREE valet parking, Entrance C,  off [employer] information: [address] [place] [address] [phone]               Allergies  Allergen Reactions   Victoza [Liraglutide] Other (See Comments)    Gave pt pancreatitis    Consultations: Cardiology   Procedures/Studies: ECHOCARDIOGRAM COMPLETE Result Date: 09/29/2023    ECHOCARDIOGRAM REPORT   Patient Name:   Michael Barnett Date of Exam: 09/29/2023 Medical Rec #:  [id number]       Height:       69.0 in Accession #:    [id number]      Weight:       222.8 lb Date of Birth:  10-27-1970       BSA:          2.163 m Patient Age:    52 years        BP:           99/50 mmHg Patient Gender: M               HR:           65 bpm. Exam Location:  Inpatient Procedure: 2D Echo, Color Doppler and Cardiac  Doppler (Both Spectral and Color            Flow Doppler were utilized during procedure). Indications:    CHF- Acute Systolic I50.21  History:        Patient has prior history of Echocardiogram examinations, most                 recent 06/15/2023. CHF, Previous Myocardial Infarction and CAD,                 AICD; Risk Factors:Diabetes and Hypertension.  Sonographer:    Harriette Bouillon RDCS Referring Phys: [id number] Cyndi Bender IMPRESSIONS  1. Left ventricular ejection fraction, by estimation, is 30 to 35%. The left ventricle has moderately decreased function. The left ventricle demonstrates regional wall motion abnormalities (see scoring diagram/findings for description). There is moderate asymmetric left ventricular hypertrophy of the basal-septal segment. Left ventricular diastolic parameters are consistent with Grade III diastolic dysfunction (restrictive). Elevated left atrial pressure.  2. Right ventricular systolic function is mildly reduced. The right ventricular size is normal. Tricuspid regurgitation signal is inadequate for assessing PA pressure.  3. Left atrial size was moderately dilated.  4. The mitral valve is degenerative. Moderate mitral valve regurgitation. No evidence of mitral stenosis.  5. The aortic valve is tricuspid. Aortic valve regurgitation is not visualized. AV gradients were not measured  6. The inferior vena cava is dilated in size with >50% respiratory variability, suggesting right atrial pressure of 8 mmHg. FINDINGS  Left Ventricle: Left ventricular ejection fraction, by estimation, is 30 to 35%. The left ventricle has moderately decreased function. The left ventricle demonstrates regional wall motion abnormalities. The left ventricular internal cavity size was normal in size. There is moderate asymmetric left ventricular hypertrophy of the basal-septal segment. Left ventricular diastolic parameters are consistent with Grade III diastolic dysfunction (restrictive). Elevated left atrial  pressure.  LV Wall Scoring: The entire lateral wall, inferior wall, and basal inferoseptal segment are akinetic. The entire anterior wall, entire anterior septum, mid inferoseptal segment, apical inferior segment, and apex are normal. Right Ventricle: The right ventricular size is normal. No increase in right ventricular wall thickness. Right ventricular systolic function is mildly reduced. Tricuspid regurgitation signal is inadequate for assessing PA pressure. Left Atrium: Left atrial size was moderately dilated. Right Atrium:  Right atrial size was normal in size. Pericardium: There is no evidence of pericardial effusion. Mitral Valve: The mitral valve is degenerative in appearance. Moderate mitral valve regurgitation. No evidence of mitral valve stenosis. Tricuspid Valve: The tricuspid valve is normal in structure. Tricuspid valve regurgitation is trivial. Aortic Valve: The aortic valve is tricuspid. Aortic valve regurgitation is not visualized. Pulmonic Valve: The pulmonic valve was not well visualized. Pulmonic valve regurgitation is not visualized. Aorta: The aortic root and ascending aorta are structurally normal, with no evidence of dilitation. Venous: The inferior vena cava is dilated in size with greater than 50% respiratory variability, suggesting right atrial pressure of 8 mmHg. IAS/Shunts: The interatrial septum was not well visualized.  LEFT VENTRICLE PLAX 2D LVIDd:         5.10 cm   Diastology LVIDs:         4.60 cm   LV e' medial:    4.90 cm/s LV PW:         1.40 cm   LV E/e' medial:  26.9 LV IVS:        1.40 cm   LV e' lateral:   7.07 cm/s LVOT diam:     2.30 cm   LV E/e' lateral: 18.7 LVOT Area:     4.15 cm  RIGHT VENTRICLE            IVC RV S prime:     9.57 cm/s  IVC diam: 1.90 cm TAPSE (M-mode): 1.3 cm LEFT ATRIUM             Index        RIGHT ATRIUM           Index LA diam:        5.30 cm 2.45 cm/m   RA Area:     14.20 cm LA Vol (A2C):   69.6 ml 32.18 ml/m  RA Volume:   32.90 ml  15.21  ml/m LA Vol (A4C):   98.8 ml 45.68 ml/m LA Biplane Vol: 90.1 ml 41.66 ml/m   AORTA Ao Root diam: 3.20 cm Ao Asc diam:  3.30 cm MITRAL VALVE MV Area (PHT): 4.68 cm       SHUNTS MV Decel Time: 162 msec       Systemic Diam: 2.30 cm MR Peak grad:    83.2 mmHg MR Mean grad:    52.0 mmHg MR Vmax:         456.00 cm/s MR Vmean:        341.0 cm/s MR PISA:         1.57 cm MR PISA Eff ROA: 33 mm MR PISA Radius:  0.50 cm MV E velocity: 132.00 cm/s Epifanio Lesches MD Electronically signed by Epifanio Lesches MD Signature Date/Time: 09/29/2023/6:17:32 PM    Final    DG Chest Portable 1 View Result Date: 09/28/2023 CLINICAL DATA:  Shortness of breath and chest pain EXAM: PORTABLE CHEST 1 VIEW COMPARISON:  08/06/2023 FINDINGS: Stable cardiomegaly. Left chest wall ICD. Sternotomy and CABG. Interstitial coarsening in the lower lungs. Question trace bilateral pleural effusions. No pneumothorax. No focal consolidation. IMPRESSION: Cardiomegaly. Question mild interstitial edema and trace bilateral pleural effusions. Electronically Signed   By: Minerva Fester M.D.   On: 09/28/2023 00:46   (Echo, Carotid, EGD, Colonoscopy, ERCP)    Subjective: Patient seen and examined.  He was up about and walking around.  Occasional discomfort on the anterior chest wall but no shortness of breath.  Leg swelling is better but is still  present.  Eager to go home.  He was wondering about referral to heart transplant team.   Discharge Exam: Vitals:   09/29/23 1933 09/29/23 1943  BP:  125/67  Pulse:  71  Resp:    Temp:  98.6 F (37 C)  SpO2: 100% 99%   Vitals:   09/29/23 0449 09/29/23 1147 09/29/23 1933 09/29/23 1943  BP: (!) 99/50 118/64  125/67  Pulse: 61 64  71  Resp: 18     Temp: 98.1 F (36.7 C)   98.6 F (37 C)  TempSrc: Oral   Oral  SpO2: 100%  100% 99%  Weight:      Height:        General: Pt is alert, awake, not in acute distress, on room air.  Getting up and walking around. Cardiovascular: RRR, S1/S2  +, no rubs, no gallops Respiratory: CTA bilaterally, no wheezing, no rhonchi Abdominal: Soft, NT, ND, bowel sounds + Extremities: 1+ edema left leg, trace edema on right leg.  Nontender.    The results of significant diagnostics from this hospitalization (including imaging, microbiology, ancillary and laboratory) are listed below for reference.     Microbiology: No results found for this or any previous visit (from the past 240 hours).   Labs: BNP (last 3 results) Recent Labs    07/01/23 1300 08/06/23 0915 09/28/23 0051  BNP 785.2* 1,067.0* 855.0*   Basic Metabolic Panel: Recent Labs  Lab 09/28/23 0049 09/29/23 0035 09/29/23 0437 09/30/23 0512  NA 137 137 139 139  K 4.0 3.8 3.6 3.8  CL 105 104 105 104  CO2 24 26 26 25   GLUCOSE 228* 160* 117* 147*  BUN 21* 23* 20 24*  CREATININE 1.34* 1.26* 1.14 1.26*  CALCIUM 8.7* 8.4* 8.3* 8.3*  MG  --   --  1.9 1.9   Liver Function Tests: Recent Labs  Lab 09/28/23 0053  AST 32  ALT 32  ALKPHOS 122  BILITOT 1.5*  PROT 6.6  ALBUMIN 3.5   Recent Labs  Lab 09/28/23 0053  LIPASE 24   No results for input(s): "AMMONIA" in the last 168 hours. CBC: Recent Labs  Lab 09/28/23 0049  WBC 7.4  HGB 15.3  HCT 48.8  MCV 96.3  PLT 136*   Cardiac Enzymes: No results for input(s): "CKTOTAL", "CKMB", "CKMBINDEX", "TROPONINI" in the last 168 hours. BNP: Invalid input(s): "POCBNP" CBG: Recent Labs  Lab 09/29/23 1219 09/29/23 1629 09/29/23 2049 09/30/23 0734 09/30/23 1131  GLUCAP 207* 188* 211* 164* 195*   D-Dimer No results for input(s): "DDIMER" in the last 72 hours. Hgb A1c No results for input(s): "HGBA1C" in the last 72 hours. Lipid Profile No results for input(s): "CHOL", "HDL", "LDLCALC", "TRIG", "CHOLHDL", "LDLDIRECT" in the last 72 hours. Thyroid function studies No results for input(s): "TSH", "T4TOTAL", "T3FREE", "THYROIDAB" in the last 72 hours.  Invalid input(s): "FREET3" Anemia work up No results for  input(s): "VITAMINB12", "FOLATE", "FERRITIN", "TIBC", "IRON", "RETICCTPCT" in the last 72 hours. Urinalysis    Component Value Date/Time   COLORURINE STRAW (A) 11/01/2021 0136   APPEARANCEUR CLEAR 11/01/2021 0136   LABSPEC 1.025 11/01/2021 0136   PHURINE 5.0 11/01/2021 0136   GLUCOSEU >=500 (A) 11/01/2021 0136   HGBUR SMALL (A) 11/01/2021 0136   BILIRUBINUR NEGATIVE 11/01/2021 0136   KETONESUR NEGATIVE 11/01/2021 0136   PROTEINUR NEGATIVE 11/01/2021 0136   NITRITE NEGATIVE 11/01/2021 0136   LEUKOCYTESUR NEGATIVE 11/01/2021 0136   Sepsis Labs Recent Labs  Lab 09/28/23 0049  WBC 7.4  Microbiology No results found for this or any previous visit (from the past 240 hours).   Time coordinating discharge: 35 minutes  SIGNED:   [name], MD  [facility] 09/30/2023, 1:28 PM

## 2023-09-30 NOTE — Progress Notes (Addendum)
 Patient Name: Michael Barnett Date of Encounter: 09/30/2023 [facility] Cardiologist: Michael Pyo, MD   Interval Summary  .    Had several episodes of lightheadedness yesterday. Had cramps yesterday that improved after taking potassium. Shortness of breath and lower extremity edema have improved. Continues to have CP.  Vital Signs .    Vitals:   09/29/23 0449 09/29/23 1147 09/29/23 1933 09/29/23 1943  BP: (!) 99/50 118/64  125/67  Pulse: 61 64  71  Resp: 18     Temp: 98.1 F (36.7 C)   98.6 F (37 C)  TempSrc: Oral   Oral  SpO2: 100%  100% 99%  Weight:      Height:       No intake or output data in the 24 hours ending 09/30/23 0708    09/28/2023    2:44 PM 09/22/2023    8:58 AM 08/13/2023    9:39 AM  Last 3 Weights  Weight (lbs) 222 lb 12.8 oz 229 lb 229 lb  Weight (kg) 101.061 kg 103.874 kg 103.874 kg      Telemetry/ECG    Normal sinus rhythm in the 60's - Personally Reviewed  Physical Exam .   GEN: No acute distress. On room air   Neck: No JVD Cardiac: RRR, no murmurs, rubs, or gallops.  Respiratory: Clear to auscultation bilaterally. MS: 2+ edema Blood pressure was 124/67  Assessment & Plan .    Michael Barnett is a 53 y.o. 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/2022 on sotalol, A Fib,  HTN, HLD, Type 2 DM, PAD s/p left femoral -popliteal bypass,  OSA, CVA, who is being seen 09/28/2023 for the evaluation of chest pain at the request of Dr Michael Barnett   Acute on chronic heart failure with reduced ejection fraction Ischemic cardiomyopathy with improved LVEF - presented with chest pain, leg edema, heart palpitation, dizziness over the past 3 days  - BNP 855 (was 1067 on 08/06/23) - CXR concern for edema - R/L heat cath 05/2023 as below   - Last echo on 05/2023  showed LVEF 45 to 50%, grade 3 diastolic dysfunction, normal RV, moderate LAE, mild RAE, mild MR, aortic sclerosis, akinetic inferior wall, hypokinetic posterior wall.  -- Echo on 09/29/2023 showed reduced LVEF of 30 to 35% and akinesis in the entire lateral wall, inferior wall and basal inferior septal segment.  Moderate asymmetric LVH of the basal septal segment, grade 3 diastolic dysfunction, and elevated atrial pressure. Plan to manage heart failure medically. - PTA (prior to admission) on PRN torsemide 20mg  BID 3-4 times weekly, was hypervolemic and IV Lasix 40mg  BID was started, no I/O recorded but pt reported good urine output and improved shortness of breath and lower extremity edema. Euvolemic on exam. Cr today 1.26, potassium 3.8, mag 1.9. replace potassium and magnesium. IV Lasix 40mg  BID was stopped yesterday.  -- Order compression stockings for lower extremity edema that is worse with standing -- Needs follow up appointment with cardiology -- Start oral lasix 40 mg daily. -- Start potassium chloride 20mg  daily. -- Start PRN metolazone 2.5mg  daily for worsening lower extremity edema or weight gain.  -- GDMT:  continue spironolactone, continue jardiance. -- Discharge home on valsartan 40mg  -- Stop metoprolol tartrate 25mg  BID -- Start Toprol XL 25mg  daily due  to lightheadedness. -- Schedule F/U in 2 weeks due to starting lasix 40mg  daily.   Chest pain Multivessel CAD s/p CABG 2013 with LIMA to LAD and SVG to OM and RCA PCI 2016 - hx of chronic atypical chest pain per Michael Barnett records  - presented with 3 days onset of resting self limiting chest pain, associated with racing heartbeat, nausea, leg edema, SOB - L/R heart cath 06/15/2023 with severe native multivessel CAD, chronic occlusion of SVG to OM and patent LIMA to LAD, stable anatomy and no target for revascularization;  elevated diastolic filling pressures with PA 37, wedge 19, Transpulmonary gradient 18 mmHg with PVR 3 Wood units, CO  5.9L/min.  - Hs trop negative x2, EKG no acute changes  - suspect demand ischemia from decompensated CHF, ACS ruled out, continues to have anginal symptoms.  - continue medical therapy with PTA ASA 81mg , lipitor 80mg . -- Stop metoprolol tartrate 25mg  BID -- Start Toprol XL 25mg  daily due to lightheadedness. -- Discharge home on valsartan 40mg    Heart palpitation  Dizziness/pre-syncope  Paroxysmal A fib CHA2DS2-VASc Score = 6 [CHF History: 1, HTN History: 1, Diabetes History: 1, Stroke History: 2, Vascular Disease History: 1, Age Score: 0, Gender Score: 0].  Therefore, the patient's annual risk of stroke is 9.7 %.     - ICD interrogation by EP 07/24/2023 hospitalization was felt consistent with A fib RVR with aberrancy triggering ICD inappropriate shock, he was maintained on sotalol, added metoprolol for rate control and eliquis for anticoagulation. - device interrogation at ED revealed multiple episodes of NSVT on 09/19/23- 09/26/23 and one episode VT on 08/14/23, no shock has been delivered, limited information due to single lead ICD, reviewed interrogation report with EP team today, felt patient had paroxysmal SVT - currently he is in sinus bradycardia 50-60s, PVCs ; continue telemetry monitor  - continue PTA sotalol 160mg  BID, eliquis 5mg  BID  -- Stop metoprolol tartrate 25mg  BID -- Start Toprol XL 25mg  daily due to lightheadedness.   Hx of monomorphic VT s/p ablation 10/2022 at Michael Barnett single chamber ICD in situ with hx of inappropriate shock for A fib RVR 06/2023  - no recurrent VT or shocks  - continue sotalol 160mg  BID. -- Stop metoprolol tartrate 25mg  BID -- Start Toprol XL 25mg  daily due to lightheadedness.   HTN -- Stop metoprolol tartrate 25mg  BID -- Start Toprol XL 25mg  daily due to lightheadedness. -- Discharge home on valsartan 40mg    HLD - continue lipitor 80mg    DM Hx of CVA  PAD  OSA - per primary team For questions or updates, please contact   [facility] Please consult [email or web address] for contact info under        Signed, Arabella Merles, PA-C

## 2023-09-30 NOTE — Progress Notes (Signed)
 Heart Failure Nurse Navigator Progress Note  PCP: [name], MD PCP-Cardiologist: Lynnette Caffey Admission Diagnosis: Chest pain, Acute on chronic congestive heart failure.  Admitted from: Home  Presentation:   Michael Barnett presented with complaints of right side chest pain, that worsens after eating, some shortness of breath and nausea and vomiting. Bilateral left leg edema, feels at times that his heart is racing. Patient reported to being hospitalized in January for fluid overload. BP 130/64, HR 69, BNP 855, Patients ICD was interrogated, did have episodes of tachycardia over the last 48 hours. CXR with concerns for edema,   Patient and his wife were educated on the sign and symptoms of heart failure, daily weights, when to call his doctor or go to the ED, Diet/ fluid restrictions, taking all medications as prescribed and attending all medical appointments. Patient and wife verbalized their understanding of education, a HF TOC appointment was scheduled for 10/06/2023 @ 11 am.   ECHO/ LVEF: 30-35%  Clinical Course:  Past Medical History:  Diagnosis Date   AICD (automatic cardioverter/defibrillator) present    Arthritis    CHF (congestive heart failure) (HCC)    Coronary artery disease    Defibrillator activation    Diabetes mellitus    Hypertension    Myocardial infarction (HCC)    Sleep apnea    Stroke [facility])      Social History   Socioeconomic History   Marital status: Single    Spouse name: Not on file   Number of children: Not on file   Years of education: Not on file   Highest education level: Not on file  Occupational History   Not on file  Tobacco Use   Smoking status: Former    Current packs/day: 0.00    Average packs/day: 0.5 packs/day for 35.0 years (17.5 ttl pk-yrs)    Types: Cigarettes    Start date: 5    Quit date: 06/30/2023    Years since quitting: 0.2   Smokeless tobacco: Not on file  Vaping Use   Vaping status: Never Used  Substance and Sexual  Activity   Alcohol use: No   Drug use: Never   Sexual activity: Yes  Other Topics Concern   Not on file  Social History Narrative   Not on file   Social Drivers of Health   Financial Resource Strain: Low Risk  (03/25/2023)   Received from [facility] [employer] Strain (CARDIA)    Difficulty of Paying Living Expenses: Not hard at all  Food Insecurity: No Food Insecurity (09/28/2023)   Hunger Vital Sign    Worried About Running Out of Food in the Last Year: Never true    Ran Out of Food in the Last Year: Never true  Transportation Needs: No Transportation Needs (09/28/2023)   PRAPARE - Administrator, Civil Service (Medical): No    Lack of Transportation (Non-Medical): No  Physical Activity: Not on file  Stress: Not on file  Social Connections: Not on file   Education Assessment and Provision:  Detailed education and instructions provided on heart failure disease management including the following:  Signs and symptoms of Heart Failure When to call the physician Importance of daily weights Low sodium diet Fluid restriction Medication management Anticipated future follow-up appointments  Patient education given on each of the above topics.  Patient acknowledges understanding via teach back method and acceptance of all instructions.  Education Materials:  "Living Better With Heart Failure" Booklet, HF  zone tool, & Daily Weight Tracker Tool.  Patient has scale at home: yes Patient has pill box at home: NA    High Risk Criteria for Readmission and/or Poor Patient Outcomes: Heart failure hospital admissions (last 6 months): 1  No Show rate: 3 % Difficult social situation: No, lives with his wife Demonstrates medication adherence: Yes Primary Language: English Literacy level: Reading, writing, and comprehension  Barriers of Care:   Diet/ fluid restrictions Daily weights  Considerations/Referrals:   Referral made to Heart  Failure Pharmacist Stewardship: Yes Referral made to Heart Failure CSW/NCM TOC: NA Referral made to Heart & Vascular TOC clinic: Yes, 10/06/2023 @ 11 am.   Items for Follow-up on DC/TOC: Continued HF education Diet/ fluid restrictions/ daily weights   Rhae Hammock, BSN, RN Heart Failure Teacher, adult education Only

## 2023-10-01 ENCOUNTER — Telehealth: Payer: Self-pay

## 2023-10-01 ENCOUNTER — Other Ambulatory Visit (HOSPITAL_COMMUNITY): Payer: Self-pay

## 2023-10-01 MED ORDER — EMPAGLIFLOZIN 25 MG PO TABS
25.0000 mg | ORAL_TABLET | Freq: Every day | ORAL | 2 refills | Status: DC
  Filled 2023-10-01: qty 90, 90d supply, fill #0

## 2023-10-01 MED ORDER — TORSEMIDE 20 MG PO TABS
20.0000 mg | ORAL_TABLET | Freq: Every day | ORAL | 0 refills | Status: DC | PRN
  Filled 2023-10-01: qty 90, 90d supply, fill #0

## 2023-10-01 MED ORDER — INSULIN GLARGINE 100 UNIT/ML SOLOSTAR PEN
30.0000 [IU] | PEN_INJECTOR | Freq: Two times a day (BID) | SUBCUTANEOUS | 0 refills | Status: DC
Start: 2023-07-16 — End: 2023-10-06
  Filled 2023-10-01: qty 12, 20d supply, fill #0

## 2023-10-01 MED ORDER — SPIRONOLACTONE 25 MG PO TABS
12.5000 mg | ORAL_TABLET | Freq: Every day | ORAL | 1 refills | Status: DC
  Filled 2023-10-01: qty 90, 180d supply, fill #0

## 2023-10-01 MED ORDER — METOPROLOL TARTRATE 25 MG PO TABS
25.0000 mg | ORAL_TABLET | Freq: Two times a day (BID) | ORAL | 3 refills | Status: DC
  Filled 2023-10-01: qty 180, 90d supply, fill #0

## 2023-10-01 MED ORDER — NICOTINE 21 MG/24HR TD PT24
21.0000 mg | MEDICATED_PATCH | Freq: Every day | TRANSDERMAL | 1 refills | Status: DC
  Filled 2023-10-01: qty 28, 28d supply, fill #0

## 2023-10-01 NOTE — Transitions of Care (Post Inpatient/ED Visit) (Signed)
   10/01/2023  Name: Michael Barnett MRN: [id number] DOB: [birth year]  Today's TOC FU Call Status: Today's TOC FU Call Status:: Unsuccessful Call (1st Attempt) Unsuccessful Call (1st Attempt) Date: 10/01/23  Attempted to reach the patient regarding the most recent Inpatient/ED visit. Patient answered and verified HIPAA but states he will call CM back.   Follow Up Plan: Additional outreach attempts will be made to reach the patient to complete the Transitions of Care (Post Inpatient/ED visit) call.   Bary Leriche RN, MSN [facility], [facility] RN Care Manager Direct Dial: [phone]  Fax: [phone] Website: [email or web address]

## 2023-10-04 ENCOUNTER — Other Ambulatory Visit (HOSPITAL_COMMUNITY): Payer: Self-pay

## 2023-10-05 ENCOUNTER — Telehealth (HOSPITAL_COMMUNITY): Payer: Self-pay

## 2023-10-05 ENCOUNTER — Telehealth: Payer: Self-pay

## 2023-10-05 NOTE — Patient Instructions (Addendum)
 Visit Information  Thank you for taking time to visit with me today. Please don't hesitate to contact me if I can be of assistance to you before our next scheduled telephone appointment.  WEIGH DAILY Weigh yourself at the same time of day and in the same kind of clothes. Ideally, weigh yourself first thing in the morning after you empty your bladder, but before you eat breakfast.   Call physician for more than 2-3 pounds in 1 day or 5 pounds in 1 week.   Limit Salt intake     Patient verbalizes understanding of instructions and care plan provided today and agrees to view in MyChart. Active MyChart status and patient understanding of how to access instructions and care plan via MyChart confirmed with patient.       Please call the care guide team at [phone] if you need to cancel or reschedule your appointment.   Please  if you are experiencing a Mental Health or Behavioral Health Crisis or need someone to talk to.  Bary Leriche RN, MSN [facility], [facility] RN Care Manager Direct Dial: [phone]  Fax: [phone] Website: [email or web address]

## 2023-10-05 NOTE — Telephone Encounter (Signed)
 Called to confirm/remind patient of their appointment at the [facility] on 10/06/2023 11:00.   Appointment:   [x] Confirmed  [] Left mess   [] No answer/No voice mail  [] Phone not in service  Patient reminded to bring all medications and/or complete list.  Confirmed patient has transportation. Gave directions, instructed to utilize valet parking.

## 2023-10-05 NOTE — Transitions of Care (Post Inpatient/ED Visit) (Signed)
 10/05/2023  Name: Michael Barnett MRN: [id number] DOB: 01/05/1971  Today's TOC FU Call Status: Today's TOC FU Call Status:: Successful TOC FU Call Completed TOC FU Call Complete Date: 10/05/23 Patient's Name and Date of Birth confirmed.  Transition Care Management Follow-up Telephone Call Date of Discharge: 09/30/23 Discharge Facility: [facility]) Type of Discharge: Inpatient Admission Primary Inpatient Discharge Diagnosis:: Congestive Heart Failure How have you been since you were released from the hospital?: Better Any questions or concerns?: No  Items Reviewed: Did you receive and understand the discharge instructions provided?: Yes Medications obtained,verified, and reconciled?: Yes (Medications Reviewed) Any new allergies since your discharge?: No Dietary orders reviewed?: Yes Type of Diet Ordered:: Low-salt and low-carb diet Do you have support at home?: Yes People in Home [RPT]: significant other Name of Support/Comfort Primary Source: [name]  Medications Reviewed Today: Medications Reviewed Today     Reviewed by [name], RN (Case Manager) on 10/05/23 at 1055  Med List Status: <None>   Medication Order Taking? Sig Documenting Provider Last Dose Status Informant  albuterol (PROVENTIL) (2.5 MG/3ML) 0.083% nebulizer solution [id number] Yes Take 3 mLs (2.5 mg total) by nebulization every 6 (six) hours as needed for wheezing or shortness of breath. [name], MD Taking Active Self, Pharmacy Records, Spouse/Significant Other  albuterol (VENTOLIN HFA) 108 (90 Base) MCG/ACT inhaler [id number] Yes Inhale 1-2 puffs into the lungs every 6 (six) hours as needed for wheezing or shortness of breath. [name], DO Taking Active Self, Pharmacy Records, Spouse/Significant Other  apixaban (ELIQUIS) 5 MG TABS tablet [id number] Yes Take 1 tablet (5 mg total) by mouth 2 (two) times daily. [name], PA-C Taking Active Pharmacy Records, Self, Spouse/Significant  Other  aspirin 81 MG chewable tablet [id number] Yes Chew 1 tablet (81 mg total) by mouth daily. [name], NP Taking Active Self, Pharmacy Records, Spouse/Significant Other  atorvastatin (LIPITOR) 80 MG tablet [id number] Yes Take 1 tablet (80 mg total) by mouth daily. [name], NP Taking Active Self, Pharmacy Records, Spouse/Significant Other  Blood Pressure KIT [id number]  Use as needed. [name], MD  Active   budesonide-formoterol [facility]) 80-4.5 MCG/ACT inhaler [id number] Yes Inhale 2 puffs into the lungs 2 (two) times daily. [name], MD Taking Active Self, Pharmacy Records, Spouse/Significant Other  Continuous Glucose Receiver (FREESTYLE LIBRE 3 READER) DEVI [id number] Yes UAD to monitor blood sugar [name], MD Taking Active Self, Pharmacy Records, Spouse/Significant Other  Continuous Glucose Sensor (FREESTYLE LIBRE 3 PLUS SENSOR) [place] [id number] Yes Change sensor every 15 days. [name], MD Taking Active Self, Pharmacy Records, Spouse/Significant Other  empagliflozin (JARDIANCE) 25 MG TABS tablet [id number] Yes Take 25 mg by mouth daily. [provider] Taking Active Self, Pharmacy Records, Spouse/Significant Other  empagliflozin (JARDIANCE) 25 MG TABS tablet [id number] No Take 1 tablet (25 mg total) by mouth daily.  Patient not taking: Reported on 10/05/2023    Not Taking Active   Fingerstix Lancets MISC [id number]  Use as directed with testing supplies up to 4 times daily. [name], PA-C  Active Self, Pharmacy Records, Spouse/Significant Other  furosemide (LASIX) 40 MG tablet [id number]  Take 1 tablet (40 mg total) by mouth daily. [name], MD  Active   glucose blood test strip [id number]  Use as instructed [name], [name], PA-C  Active Self, Pharmacy Records, Spouse/Significant Other  insulin glargine (LANTUS) 100 UNIT/ML Solostar Pen [id number] No Inject 30 Units into the skin 2 (two) times daily in the morning  and at bedtime.   Patient not taking: Reported on 10/05/2023    Not Taking Active   insulin glargine (LANTUS) 100 unit/mL SOPN [id number] Yes Inject 15 Units into the skin at bedtime. [name], MD Taking Active Self, Pharmacy Records, Spouse/Significant Other           Med Note [name], [name]   Tue Oct 05, 2023 10:52 AM) Taking twice a day  Lancet Devices (TRUEDRAW LANCING DEVICE) MISC [id number]  Use as directed up to four times daily [name], [name], PA-C  Active Self, Pharmacy Records, Spouse/Significant Other  metolazone (ZAROXOLYN) 2.5 MG tablet [id number] Yes Take 1 tablet (2.5 mg total) by mouth daily as needed (increasing leg swelling, shortness of breath or weight gain > 3lbs overnight). [name], MD Taking Active   metoprolol succinate (TOPROL-XL) 25 MG 24 hr tablet [id number] Yes Take 1 tablet (25 mg total) by mouth daily. [name], MD Taking Active   metoprolol tartrate (LOPRESSOR) 25 MG tablet [id number] No Take 1 tablet (25 mg total) by mouth 2 (two) times daily.  Patient not taking: Reported on 10/05/2023    Not Taking Active   nicotine (NICODERM CQ - DOSED IN MG/24 HOURS) 21 mg/24hr patch [id number] No Place 1 patch (21 mg total) onto the skin daily.  Patient not taking: Reported on 10/05/2023   [name], MD Not Taking Active   nitroGLYCERIN (NITROSTAT) 0.4 MG SL tablet [id number] Yes Place 0.4 mg under the tongue every 5 (five) minutes as needed for chest pain. [provider] Taking Active Self, Pharmacy Records, Spouse/Significant Other           Med Note (WHITE, TONIA S   Tue Sep 28, 2023  5:12 AM)    oxyCODONE-acetaminophen (PERCOCET) 10-325 MG tablet [id number] Yes Take 1 tablet by mouth every 4 (four) hours as needed for pain. [provider] Taking Active Self, Pharmacy Records, Spouse/Significant Other           Med Note (WHITE, TONIA S   Tue Sep 28, 2023  5:13 AM)    pantoprazole (PROTONIX) 40 MG tablet [id number] Yes Take 1 tablet (40 mg total) by  mouth daily. [name], PA-C Taking Active Self, Pharmacy Records, Spouse/Significant Other  potassium chloride SA (KLOR-CON M) 20 MEQ tablet [id number] Yes Take 1 tablet (20 mEq total) by mouth daily. [name], MD Taking Active   sotalol (BETAPACE) 160 MG tablet [id number] Yes Take 1 tablet (160 mg total) by mouth 2 (two) times daily. [name], NP Taking Active Self, Pharmacy Records, Spouse/Significant Other  spironolactone (ALDACTONE) 25 MG tablet [id number] Yes Take 0.5 tablets (12.5 mg total) by mouth daily.  Taking Active   torsemide (DEMADEX) 20 MG tablet [id number] No Take 1 tablet (20 mg total) by mouth daily as needed.  Patient not taking: Reported on 10/05/2023   [name], NP Not Taking Active   valsartan (DIOVAN) 40 MG tablet [id number] Yes Take 1 tablet (40 mg total) by mouth daily. [name], MD Taking Active             Home Care and Equipment/Supplies: Were Home Health Services Ordered?: No Any new equipment or medical supplies ordered?: No  Functional Questionnaire: Do you need assistance with bathing/showering or dressing?: No Do you need assistance with meal preparation?: No Do you need assistance with eating?: No Do you have difficulty maintaining continence: No Do you need assistance with getting out of bed/getting out of a chair/moving?: No Do  you have difficulty managing or taking your medications?: No  Follow up appointments reviewed: PCP Follow-up appointment confirmed?: Yes Date of PCP follow-up appointment?: 10/11/23 Follow-up Provider: Dr. Jonah Blue Specialist [facility] Follow-up appointment confirmed?: Yes Date of Specialist follow-up appointment?: 10/06/23 Follow-Up Specialty Provider:: Heart and Vascular Do you need transportation to your follow-up appointment?: No Do you understand care options if your condition(s) worsen?: Yes-patient verbalized understanding  SDOH Interventions Today    Flowsheet Row Most Recent  Value  SDOH Interventions   Food Insecurity Interventions Intervention Not Indicated  Housing Interventions Intervention Not Indicated  Transportation Interventions Intervention Not Indicated  Utilities Interventions Intervention Not Indicated      Patient reports he is better since being at home.  He has follow up appointments scheduled. No transportation issues.  Discussed heart failure management and importance of weights. Patient states he has not been weighing daily but states he will start.  Patient declined 30 day TOC program.  Bary Leriche RN, MSN [facility], [facility] Health RN Care Manager Direct Dial: [phone]  Fax: [phone] Website: [email or web address]

## 2023-10-06 ENCOUNTER — Ambulatory Visit (HOSPITAL_COMMUNITY)
Admit: 2023-10-06 | Discharge: 2023-10-06 | Disposition: A | Discharge status: Home / Self Care | Attending: Physician Assistant | Admitting: Physician Assistant

## 2023-10-06 ENCOUNTER — Other Ambulatory Visit (HOSPITAL_COMMUNITY): Payer: Self-pay

## 2023-10-06 ENCOUNTER — Encounter (HOSPITAL_COMMUNITY): Payer: Self-pay

## 2023-10-06 ENCOUNTER — Telehealth (HOSPITAL_COMMUNITY): Payer: Self-pay

## 2023-10-06 VITALS — BP 124/70 | HR 67 | Wt 217.4 lb

## 2023-10-06 DIAGNOSIS — E11621 Type 2 diabetes mellitus with foot ulcer: Secondary | ICD-10-CM | POA: Diagnosis not present

## 2023-10-06 DIAGNOSIS — R42 Dizziness and giddiness: Secondary | ICD-10-CM | POA: Diagnosis not present

## 2023-10-06 DIAGNOSIS — L97529 Non-pressure chronic ulcer of other part of left foot with unspecified severity: Secondary | ICD-10-CM | POA: Insufficient documentation

## 2023-10-06 DIAGNOSIS — E1165 Type 2 diabetes mellitus with hyperglycemia: Secondary | ICD-10-CM | POA: Diagnosis not present

## 2023-10-06 DIAGNOSIS — H547 Unspecified visual loss: Secondary | ICD-10-CM | POA: Insufficient documentation

## 2023-10-06 DIAGNOSIS — Z955 Presence of coronary angioplasty implant and graft: Secondary | ICD-10-CM | POA: Insufficient documentation

## 2023-10-06 DIAGNOSIS — Z79899 Other long term (current) drug therapy: Secondary | ICD-10-CM | POA: Insufficient documentation

## 2023-10-06 DIAGNOSIS — G4733 Obstructive sleep apnea (adult) (pediatric): Secondary | ICD-10-CM | POA: Diagnosis not present

## 2023-10-06 DIAGNOSIS — M1611 Unilateral primary osteoarthritis, right hip: Secondary | ICD-10-CM | POA: Diagnosis not present

## 2023-10-06 DIAGNOSIS — I255 Ischemic cardiomyopathy: Secondary | ICD-10-CM | POA: Insufficient documentation

## 2023-10-06 DIAGNOSIS — I110 Hypertensive heart disease with heart failure: Secondary | ICD-10-CM | POA: Insufficient documentation

## 2023-10-06 DIAGNOSIS — I2510 Atherosclerotic heart disease of native coronary artery without angina pectoris: Secondary | ICD-10-CM | POA: Diagnosis not present

## 2023-10-06 DIAGNOSIS — Z87891 Personal history of nicotine dependence: Secondary | ICD-10-CM | POA: Insufficient documentation

## 2023-10-06 DIAGNOSIS — Z7901 Long term (current) use of anticoagulants: Secondary | ICD-10-CM | POA: Diagnosis not present

## 2023-10-06 DIAGNOSIS — I480 Paroxysmal atrial fibrillation: Secondary | ICD-10-CM | POA: Insufficient documentation

## 2023-10-06 DIAGNOSIS — Z736 Limitation of activities due to disability: Secondary | ICD-10-CM | POA: Insufficient documentation

## 2023-10-06 DIAGNOSIS — R0789 Other chest pain: Secondary | ICD-10-CM | POA: Diagnosis not present

## 2023-10-06 DIAGNOSIS — Z95810 Presence of automatic (implantable) cardiac defibrillator: Secondary | ICD-10-CM | POA: Insufficient documentation

## 2023-10-06 DIAGNOSIS — E1151 Type 2 diabetes mellitus with diabetic peripheral angiopathy without gangrene: Secondary | ICD-10-CM | POA: Insufficient documentation

## 2023-10-06 DIAGNOSIS — Z7982 Long term (current) use of aspirin: Secondary | ICD-10-CM | POA: Insufficient documentation

## 2023-10-06 DIAGNOSIS — I25810 Atherosclerosis of coronary artery bypass graft(s) without angina pectoris: Secondary | ICD-10-CM | POA: Insufficient documentation

## 2023-10-06 DIAGNOSIS — I472 Ventricular tachycardia, unspecified: Secondary | ICD-10-CM | POA: Diagnosis not present

## 2023-10-06 DIAGNOSIS — I493 Ventricular premature depolarization: Secondary | ICD-10-CM | POA: Insufficient documentation

## 2023-10-06 DIAGNOSIS — Z794 Long term (current) use of insulin: Secondary | ICD-10-CM | POA: Insufficient documentation

## 2023-10-06 DIAGNOSIS — Z8673 Personal history of transient ischemic attack (TIA), and cerebral infarction without residual deficits: Secondary | ICD-10-CM | POA: Insufficient documentation

## 2023-10-06 DIAGNOSIS — I5022 Chronic systolic (congestive) heart failure: Secondary | ICD-10-CM | POA: Insufficient documentation

## 2023-10-06 DIAGNOSIS — I447 Left bundle-branch block, unspecified: Secondary | ICD-10-CM | POA: Insufficient documentation

## 2023-10-06 DIAGNOSIS — Z7984 Long term (current) use of oral hypoglycemic drugs: Secondary | ICD-10-CM | POA: Insufficient documentation

## 2023-10-06 LAB — BASIC METABOLIC PANEL WITH GFR
Anion gap: 9 (ref 5–15)
BUN: 24 mg/dL — ABNORMAL HIGH (ref 6–20)
CO2: 25 mmol/L (ref 22–32)
Calcium: 8.7 mg/dL — ABNORMAL LOW (ref 8.9–10.3)
Chloride: 103 mmol/L (ref 98–111)
Creatinine, Ser: 1.15 mg/dL (ref 0.61–1.24)
GFR, Estimated: >60 mL/min (ref >60)
Glucose, Bld: 242 mg/dL — ABNORMAL HIGH (ref 70–99)
Potassium: 4 mmol/L (ref 3.5–5.1)
Sodium: 137 mmol/L (ref 135–145)

## 2023-10-06 LAB — BRAIN NATRIURETIC PEPTIDE: B Natriuretic Peptide: 451.2 pg/mL — ABNORMAL HIGH (ref 0.0–100.0)

## 2023-10-06 MED ORDER — ENTRESTO 24-26 MG PO TABS
1.0000 | ORAL_TABLET | Freq: Two times a day (BID) | ORAL | 3 refills | Status: DC
  Filled 2023-10-06: qty 60, 30d supply, fill #0
  Filled 2023-11-29: qty 60, 30d supply, fill #1

## 2023-10-06 NOTE — Patient Instructions (Addendum)
 Medication Changes:  STOP VALSARTAN   START: ENTRESTO 24/26MG  TWICE DAILY   Lab Work:  Labs done today, your results will be available in MyChart, we will contact you for abnormal readings.  THEN LABS AGAIN IN 2 WEEKS AS SCHEDULED   Referrals:  YOU HAVE BEEN REFERRED TO PULMONOLOGY FOR SLEEP APNEA THEY WILL REACH OUT TO YOU OR CALL TO ARRANGE THIS. PLEASE CALL us WITH ANY CONCERNS   Follow-Up in: 4 WEEKS AS SCHEDULED WITH DR. Gala Romney   At the [facility], you and your health needs are our priority. We have a designated team specialized in the treatment of Heart Failure. This Care Team includes your primary Heart Failure Specialized Cardiologist (physician), Advanced Practice Providers (APPs- Physician Assistants and Nurse Practitioners), and Pharmacist who all work together to provide you with the care you need, when you need it.   You may see any of the following providers on your designated Care Team at your next follow up:  Dr. Arvilla Meres Dr. Marca Ancona Dr. Dorthula Nettles Dr. Theresia Bough Tonye Becket, NP Robbie Lis, [place] [facility] [place], [place] Brynda Peon, NP [place] Lee, NP Karle Plumber, PharmD   Please be sure to bring in all your medications bottles to every appointment.   Need to Contact us:  If you have any questions or concerns before your next appointment please send [place] a message through [place] or call our office at [phone].    TO LEAVE A MESSAGE FOR THE NURSE SELECT OPTION 2, PLEASE LEAVE A MESSAGE INCLUDING: YOUR NAME DATE OF BIRTH CALL BACK NUMBER REASON FOR CALL**this is important as we prioritize the call backs  YOU WILL RECEIVE A CALL BACK THE SAME DAY AS LONG AS YOU CALL BEFORE 4:00 PM

## 2023-10-06 NOTE — Progress Notes (Addendum)
 HEART & VASCULAR TRANSITION OF CARE CONSULT NOTE     Referring Physician: Dr. Edilia Bo, Binnie Rail, MD   Chief Complaint: chronic systolic CHF  HPI: Referred to clinic by Dr. Jerral Ralph with [facility] for heart failure consultation.   Michael Barnett is a 53 y.o. male history of chronic systolic CHF/ICM s/p ICD, CAD s/p CABG (LIMA to LAD, SVG to OM) 2013 and PCI to RCA in 2016, VT s/p VT ablation in 05/24 on Sotalol, PAF, PAD s/p left fem-pop, OSA, CVA, poorly controlled DM II, recurrent dizziness/presyncope felt to be noncardiac. Has history of chronic atypical chest pain on review of outside records.  Previously followed by Cardiology at [facility]. Had been followed by Advanced Heart Failure at OSH in past. Underwent evaluation for transplant but later declined d/t noncompliance. Established with Dr. Lynnette Caffey 12/24.   EF previously 25% 09/22. EF improved to 40-45% on echo 05/24.   R/LHC 12/24: occluded SVG to OM, patent LIMA to LAD, LIMA backfills the p LAD and its branches, also supplies collaterals to LCX, occluded p Cx, severe RCA disease, elevated filling pressures with preserved Fick CO. Echo 12/24: EF 45-50%, grade III DD, RV okay  Admitted 01/25 following ICD shock. On device interrogation rhythm appeared to be Afib with RVR >> inappropriate shock.  Admitted 09/28/23 with acute on chronic CHF and chest pain.  Echo with EF 30-35%, RWMA, grade III DD. Cardiology consulted. He was diuresed and GDMT titrated.   Here today for post hospital CHF follow-up. He is accompanied by his fiance who assists with the history. Has been feeling well. No longer having dyspnea/wheezing or lower extremity edema. Taking all medications as prescribed. Planning to purchase a scale and pill organizer after today's visit. Watching fluid and sodium intake. Activity level limited by right hip pain which he attributes to arthritis. States his right hip gives out when he walks (note history of PAD).   Reports  he recently quit smoking. Does not use ETOH or drugs.  On disability due to vision impairment. Lives with his fiance.   Past Medical History:  Diagnosis Date   AICD (automatic cardioverter/defibrillator) present    Arthritis    CHF (congestive heart failure) (HCC)    Coronary artery disease    Defibrillator activation    Diabetes mellitus    Hypertension    Myocardial infarction [facility])    Sleep apnea    Stroke [facility])     Current Outpatient Medications  Medication Sig Dispense Refill   albuterol (PROVENTIL) (2.5 MG/3ML) 0.083% nebulizer solution Take 3 mLs (2.5 mg total) by nebulization every 6 (six) hours as needed for wheezing or shortness of breath. 150 mL 1   albuterol (VENTOLIN HFA) 108 (90 Base) MCG/ACT inhaler Inhale 1-2 puffs into the lungs every 6 (six) hours as needed for wheezing or shortness of breath. 1 each 0   apixaban (ELIQUIS) 5 MG TABS tablet Take 1 tablet (5 mg total) by mouth 2 (two) times daily. 180 tablet 3   aspirin 81 MG chewable tablet Chew 1 tablet (81 mg total) by mouth daily. 90 tablet 3   atorvastatin (LIPITOR) 80 MG tablet Take 1 tablet (80 mg total) by mouth daily. 90 tablet 3   Blood Pressure KIT Use as needed. 1 kit 0   budesonide-formoterol (SYMBICORT) 80-4.5 MCG/ACT inhaler Inhale 2 puffs into the lungs 2 (two) times daily. 1 each 5   Continuous Glucose Receiver (FREESTYLE LIBRE 3 READER) DEVI UAD to monitor blood sugar 1  each 0   Continuous Glucose Sensor (FREESTYLE LIBRE 3 PLUS SENSOR) MISC Change sensor every 15 days. 2 each 6   empagliflozin (JARDIANCE) 25 MG TABS tablet Take 1 tablet (25 mg total) by mouth daily. 90 tablet 2   Fingerstix Lancets MISC Use as directed with testing supplies up to 4 times daily. 100 each 0   furosemide (LASIX) 40 MG tablet Take 1 tablet (40 mg total) by mouth daily. 30 tablet 0   glucose blood test strip Use as instructed 100 each 12   insulin glargine (LANTUS) 100 UNIT/ML injection Inject 15 Units into the skin 2  (two) times daily.     Lancet Devices (TRUEDRAW LANCING DEVICE) MISC Use as directed up to four times daily 1 each 0   metolazone (ZAROXOLYN) 2.5 MG tablet Take 1 tablet (2.5 mg total) by mouth daily as needed (increasing leg swelling, shortness of breath or weight gain > 3lbs overnight). 30 tablet 0   metoprolol succinate (TOPROL-XL) 25 MG 24 hr tablet Take 1 tablet (25 mg total) by mouth daily. 90 tablet 0   nitroGLYCERIN (NITROSTAT) 0.4 MG SL tablet Place 0.4 mg under the tongue every 5 (five) minutes as needed for chest pain.     oxyCODONE-acetaminophen (PERCOCET) 10-325 MG tablet Take 1 tablet by mouth every 4 (four) hours as needed for pain.     pantoprazole (PROTONIX) 40 MG tablet Take 1 tablet (40 mg total) by mouth daily. 30 tablet 3   potassium chloride SA (KLOR-CON M) 20 MEQ tablet Take 1 tablet (20 mEq total) by mouth daily. 30 tablet 0   sacubitril-valsartan (ENTRESTO) 24-26 MG Take 1 tablet by mouth 2 (two) times daily. 60 tablet 3   sotalol (BETAPACE) 160 MG tablet Take 1 tablet (160 mg total) by mouth 2 (two) times daily. 180 tablet 3   spironolactone (ALDACTONE) 25 MG tablet Take 0.5 tablets (12.5 mg total) by mouth daily. 90 tablet 1   nicotine (NICODERM CQ - DOSED IN MG/24 HOURS) 21 mg/24hr patch Place 1 patch (21 mg total) onto the skin daily. (Patient not taking: Reported on 10/06/2023) 28 patch 1   No current facility-administered medications for this encounter.    Allergies  Allergen Reactions   Victoza [Liraglutide] Other (See Comments)    Gave pt pancreatitis      Social History   Socioeconomic History   Marital status: Single    Spouse name: Not on file   Number of children: 3   Years of education: Not on file   Highest education level: Associate degree: academic program  Occupational History   Occupation: Disabilty  Tobacco Use   Smoking status: Former    Current packs/day: 0.00    Average packs/day: 0.5 packs/day for 35.0 years (17.5 ttl pk-yrs)     Types: Cigarettes    Start date: 33    Quit date: 06/30/2023    Years since quitting: 0.2   Smokeless tobacco: Not on file  Vaping Use   Vaping status: Never Used  Substance and Sexual Activity   Alcohol use: No   Drug use: Never   Sexual activity: Yes  Other Topics Concern   Not on file  Social History Narrative   Not on file   Social Drivers of Health   Financial Resource Strain: Low Risk  (09/30/2023)   Overall Financial Resource Strain (CARDIA)    Difficulty of Paying Living Expenses: Not very hard  Food Insecurity: No Food Insecurity (10/05/2023)   Hunger Vital Sign  Worried About Programme researcher, broadcasting/film/video in the Last Year: Never true    Ran Out of Food in the Last Year: Never true  Transportation Needs: No Transportation Needs (10/05/2023)   PRAPARE - Administrator, Civil Service (Medical): No    Lack of Transportation (Non-Medical): No  Physical Activity: Not on file  Stress: Not on file  Social Connections: Not on file  Intimate Partner Violence: Not At Risk (10/05/2023)   Humiliation, Afraid, Rape, and Kick questionnaire    Fear of Current or Ex-Partner: No    Emotionally Abused: No    Physically Abused: No    Sexually Abused: No      Family History  Problem Relation Age of Onset   Heart attack Mother    Heart attack Father    Heart attack Maternal Uncle     Vitals:   10/06/23 1132  BP: 124/70  Pulse: 67  SpO2: 96%  Weight: 98.6 kg (217 lb 6.4 oz)    PHYSICAL EXAM: General:  Well appearing.  Neck: no JVD.  Cor: Regular rate & rhythm. No rubs, gallops or murmurs. Lungs: clear Abdomen: soft, nontender, nondistended. Extremities: no edema Neuro: alert & oriented x 3. Affect pleasant.  ECG: SR 69 bpm   ASSESSMENT & PLAN: Chronic systolic CHF -Ischemic cardiomyopathy -R/LHC 12/24: see cath below, elevated filling pressures with preserved Fick CO.  -EF previously as low as 25% in 2022 -Echo 12/24: EF 45-50%, grade III DD, RV okay -Echo  03/25: EF 30-35% -NYHA status difficult d/t leg pain.  -Volume looks good on exam. Continue lasix 40 mg daily. Has PRN metolazone on medication list. -Continue Jardiance 25 mg daily (diabetes dose) -Continue spiro 12.5 mg daily -Continue toprol xl 25 mg daily -Switch valsartan to entresto 24/26 mg BID -BMET/BNP today, repeat BMET 2 weeks -May eventually require advanced therapies.   CAD -CABG X 2 in 2013 with SVG to OM and LIMA to LAD -PCI to RCA in 2016 -LHC 12/24 occluded SVG to OM, patent LIMA to LAD, LIMA backfills the p LAD and its branches, also supplies collaterals to LCX, occluded p Cx, severe RCA disease. No substrate for revascularization.  -Chronic atypical CP -On asa and high-intensity statin  PAF -SR on ECG today -Continue Eliquis  VT PVCs -S/p VT ablation in 05/24 at [facility] -Continue sotalol -Followed by EP  PAD -S/p left fempop in 2024 -Has been following with podiatry for pre-ulcerative changes left foot.  -Complaining of R leg/hip pain. R ABI 0.6 in 01/25. ? Symptoms consistent with claudication. -He was instructed to follow-up with [facility]  OSA -CPAP machine broke -Will refer to pulmonary   Referred to HFSW (PCP, Medications, Transportation, ETOH Abuse, Drug Abuse, Insurance, Financial ): Yes or No Refer to Pharmacy: No Refer to Home Health: No Refer to Advanced Heart Failure Clinic: Yes Refer to General Cardiology: No, established  Follow up  Keep f/u with Cardiology, establish with Dr. Gala Romney in 4 weeks

## 2023-10-06 NOTE — Telephone Encounter (Signed)
 Advanced Heart Failure Patient Advocate Encounter  Test billing for Michael Barnett returns $4 for 90 days. No medication assistance needed at this time.  Burnell Blanks, CPhT Rx Patient Advocate Phone: [phone]

## 2023-10-07 ENCOUNTER — Ambulatory Visit: Admitting: Physician Assistant

## 2023-10-09 ENCOUNTER — Telehealth: Admitting: Family Medicine

## 2023-10-09 ENCOUNTER — Other Ambulatory Visit (HOSPITAL_COMMUNITY): Payer: Self-pay

## 2023-10-09 ENCOUNTER — Encounter (HOSPITAL_COMMUNITY): Payer: Self-pay

## 2023-10-09 ENCOUNTER — Emergency Department (HOSPITAL_COMMUNITY)
Admission: EM | Admit: 2023-10-09 | Discharge: 2023-10-09 | Disposition: A | Discharge status: Home / Self Care | Attending: Emergency Medicine | Admitting: Emergency Medicine

## 2023-10-09 ENCOUNTER — Other Ambulatory Visit: Payer: Self-pay

## 2023-10-09 DIAGNOSIS — Z794 Long term (current) use of insulin: Secondary | ICD-10-CM | POA: Insufficient documentation

## 2023-10-09 DIAGNOSIS — R200 Anesthesia of skin: Secondary | ICD-10-CM | POA: Insufficient documentation

## 2023-10-09 DIAGNOSIS — R202 Paresthesia of skin: Secondary | ICD-10-CM | POA: Diagnosis not present

## 2023-10-09 DIAGNOSIS — Z7982 Long term (current) use of aspirin: Secondary | ICD-10-CM | POA: Insufficient documentation

## 2023-10-09 DIAGNOSIS — Z8673 Personal history of transient ischemic attack (TIA), and cerebral infarction without residual deficits: Secondary | ICD-10-CM

## 2023-10-09 DIAGNOSIS — M5416 Radiculopathy, lumbar region: Secondary | ICD-10-CM

## 2023-10-09 DIAGNOSIS — R252 Cramp and spasm: Secondary | ICD-10-CM

## 2023-10-09 DIAGNOSIS — M545 Low back pain, unspecified: Secondary | ICD-10-CM | POA: Insufficient documentation

## 2023-10-09 MED ORDER — KETOROLAC TROMETHAMINE 30 MG/ML IJ SOLN
30.0000 mg | Freq: Once | INTRAMUSCULAR | Status: AC
  Administered 2023-10-09: 30 mg via INTRAVENOUS
  Filled 2023-10-09: qty 1

## 2023-10-09 MED ORDER — IBUPROFEN 600 MG PO TABS
600.0000 mg | ORAL_TABLET | Freq: Four times a day (QID) | ORAL | 0 refills | Status: DC | PRN
  Filled 2023-10-09: qty 30, 8d supply, fill #0

## 2023-10-09 NOTE — Patient Instructions (Signed)
Paresthesia Paresthesia is an abnormal burning or prickling sensation. It is usually felt in the hands, arms, legs, or feet. However, it may occur in any part of the body. Usually, paresthesia is not painful. It may feel like: Tingling or numbness. Buzzing. Itching. Paresthesia may occur without any clear cause, or it may be caused by: Breathing too quickly (hyperventilation). Pressure on a nerve. An underlying medical condition. Side effects of a medicine. Nutritional deficiencies. Exposure to toxic chemicals. Most people experience temporary (transient) paresthesia at some time in their lives. For some people, it may be long-lasting (chronic) because of an underlying medical condition. If you have paresthesia that lasts a long time, you need to be evaluated by your health care provider. Follow these instructions at home: Nutrition Eat a healthy diet. This includes: Eating foods that are high in fiber, such as beans, whole grains, and fresh fruits and vegetables. Limiting foods that are high in fat and processed sugars, such as fried or sweet foods.  Alcohol use  Avoid or limit alcohol. Too much alcohol can cause a vitamin B deficiency, and vitamin B is needed for healthy nerves. Do not drink alcohol if: Your health care provider tells you not to drink. You are pregnant, may be pregnant, or are planning to become pregnant. If you drink alcohol: Limit how much you have to: 0-1 drink a day for women. 0-2 drinks a day for men. Know how much alcohol is in your drink. In the [place] one drink equals one 12 oz bottle of beer (355 mL), one 5 oz glass of wine (148 mL), or one 1 oz glass of hard liquor (44 mL). General instructions Take over-the-counter and prescription medicines only as told by your health care provider. Do not use any products that contain nicotine or tobacco. These products include cigarettes, chewing tobacco, and vaping devices, such as e-cigarettes. If you need help  quitting, ask your health care provider. If you have diabetes, work closely with your health care provider to keep your blood sugar under control. If you have numbness in your feet: Check every day for signs of injury or infection. Watch for redness, warmth, and swelling. Wear padded socks and comfortable shoes. These help protect your feet. Keep all follow-up visits. This is important. Contact a health care provider if you: Have paresthesia that gets worse or does not go away. Have numbness after an injury. Have a burning or prickling feeling that gets worse when you walk. Have pain, cramps, or dizziness, or you faint. Develop a rash. Get help right away if you: Feel muscle weakness. Develop new weakness in an arm or leg. Have trouble walking or moving. Have problems with speech, understanding, or vision. Feel confused. Cannot control your bladder or bowel movements. These symptoms may be an emergency. Get help right away. Call 911. Do not wait to see if the symptoms will go away. Do not drive yourself to the hospital. Summary Paresthesia is an abnormal burning or prickling sensation that is usually felt in the hands, arms, legs, or feet. It may also occur in other parts of the body. Paresthesia may occur without any clear cause, or it may be caused by breathing too quickly (hyperventilation), pressure on a nerve, an underlying medical condition, side effects of a medicine, nutritional deficiencies, or exposure to toxic chemicals. If you have paresthesia that lasts a long time, you need to be evaluated by your health care provider. This information is not intended to replace advice given to you by  your health care provider. Make sure you discuss any questions you have with your health care provider. Document Revised: 02/24/2021 Document Reviewed: 02/24/2021 Elsevier Patient Education  2024 [employer].

## 2023-10-09 NOTE — ED Triage Notes (Signed)
 Pt c.o right sided leg numbness on the back of his thigh and calf with heaviness in his right leg. Pt also c.o right hand numbness that all started about 2 days ago. Pt had tele health visit this morning and they advised him to come here.  Pt also c.o headache this morning lasting about 15 mins.

## 2023-10-09 NOTE — ED Provider Notes (Signed)
 [place] EMERGENCY DEPARTMENT AT [facility] Provider Note   CSN: [id number] Arrival date & time: 10/09/23  1120     History  Chief Complaint  Patient presents with   Numbness    Michael Barnett is a 53 y.o. male.  HPI     Home Medications Prior to Admission medications   Medication Sig Start Date End Date Taking? Authorizing Provider  albuterol  (PROVENTIL ) (2.5 MG/3ML) 0.083% nebulizer solution Take 3 mLs (2.5 mg total) by nebulization every 6 (six) hours as needed for wheezing or shortness of breath. 08/26/23   Lawrance Presume, MD  albuterol  (VENTOLIN  HFA) [phone] Base) MCG/ACT inhaler Inhale 1-2 puffs into the lungs every 6 (six) hours as needed for wheezing or shortness of breath. 08/06/23   Teddi Favors, DO  apixaban  (ELIQUIS ) 5 MG TABS tablet Take 1 tablet (5 mg total) by mouth 2 (two) times daily. 07/25/23   Parcells, Cyrilla Drivers, PA-C  aspirin  81 MG chewable tablet Chew 1 tablet (81 mg total) by mouth daily. 08/13/23   Thomasena Fleming, NP  atorvastatin  (LIPITOR ) 80 MG tablet Take 1 tablet (80 mg total) by mouth daily. 08/13/23   Thomasena Fleming, NP  Blood Pressure KIT Use as needed. 09/30/23   Ghimire, Kuber, MD  budesonide -formoterol  (SYMBICORT ) 80-4.5 MCG/ACT inhaler Inhale 2 puffs into the lungs 2 (two) times daily. 08/26/23   Lawrance Presume, MD  Continuous Glucose Receiver (FREESTYLE LIBRE 3 READER) DEVI UAD to monitor blood sugar 07/12/23   Lawrance Presume, MD  Continuous Glucose Sensor (FREESTYLE LIBRE 3 PLUS SENSOR) MISC Change sensor every 15 days. 07/12/23   Lawrance Presume, MD  empagliflozin  (JARDIANCE ) 25 MG TABS tablet Take 1 tablet (25 mg total) by mouth daily. 07/13/23     Fingerstix Lancets MISC Use as directed with testing supplies up to 4 times daily. 06/28/23   [name], [name], PA-C  furosemide  (LASIX ) 40 MG tablet Take 1 tablet (40 mg total) by mouth daily. 10/01/23 10/31/23  Vada Garibaldi, MD  glucose blood test strip Use as instructed 06/28/23    [name], [name], PA-C  insulin  glargine (LANTUS ) 100 UNIT/ML injection Inject 15 Units into the skin 2 (two) times daily.    [provider]  Lancet Devices (TRUEDRAW LANCING DEVICE) MISC Use as directed up to four times daily 06/28/23   [name], [name], PA-C  metolazone  (ZAROXOLYN ) 2.5 MG tablet Take 1 tablet (2.5 mg total) by mouth daily as needed (increasing leg swelling, shortness of breath or weight gain > 3lbs overnight). 09/30/23 10/30/23  Ghimire, Kuber, MD  metoprolol  succinate (TOPROL -XL) 25 MG 24 hr tablet Take 1 tablet (25 mg total) by mouth daily. 10/01/23   Ghimire, Kuber, MD  nicotine  (NICODERM CQ  - DOSED IN MG/24 HOURS) 21 mg/24hr patch Place 1 patch (21 mg total) onto the skin daily. Patient not taking: Reported on 10/06/2023 07/12/23   Lawrance Presume, MD  nitroGLYCERIN  (NITROSTAT ) 0.4 MG SL tablet Place 0.4 mg under the tongue every 5 (five) minutes as needed for chest pain.    [provider]  oxyCODONE -acetaminophen  (PERCOCET) 10-325 MG tablet Take 1 tablet by mouth every 4 (four) hours as needed for pain.    [provider]  pantoprazole  (PROTONIX ) 40 MG tablet Take 1 tablet (40 mg total) by mouth daily. 08/11/23   McClung, Angela M, PA-C  potassium chloride  SA (KLOR-CON  M) 20 MEQ tablet Take 1 tablet (20 mEq total) by mouth daily. 09/30/23 10/30/23  Ghimire, Kuber,  MD  sacubitril -valsartan  (ENTRESTO ) 24-26 MG Take 1 tablet by mouth 2 (two) times daily. 10/06/23   Arleene Belt, PA-C  sotalol  (BETAPACE ) 160 MG tablet Take 1 tablet (160 mg total) by mouth 2 (two) times daily. 08/13/23   Thomasena Fleming, NP  spironolactone  (ALDACTONE ) 25 MG tablet Take 0.5 tablets (12.5 mg total) by mouth daily. 04/16/23         Allergies    Victoza [liraglutide]    Review of Systems   Review of Systems  Genitourinary:  Negative for difficulty urinating.       Denies urinary, incontinence or retention  Musculoskeletal:  Positive for back pain. Negative for gait  problem.       Endorses right leg heaviness  Skin:  Negative for color change and pallor.  Neurological:  Positive for numbness. Negative for dizziness, facial asymmetry and light-headedness.       Endorses numbness along the lateral right thigh along with the lateral right calf and right lateral malleolus    Physical Exam Updated Vital Signs BP 122/62   Pulse 71   Temp 97.9 F (36.6 C) (Oral)   Resp 18   Ht 5\' 9"  (1.753 m)   Wt 98.4 kg   SpO2 100%   BMI 32.05 kg/m  Physical Exam Vitals and nursing note reviewed.  Constitutional:      General: He is not in acute distress.    Appearance: Normal appearance.  HENT:     Head: Normocephalic and atraumatic.     Mouth/Throat:     Mouth: Mucous membranes are moist.     Pharynx: Oropharynx is clear.  Eyes:     Extraocular Movements: Extraocular movements intact.     Conjunctiva/sclera: Conjunctivae normal.     Pupils: Pupils are equal, round, and reactive to light.  Cardiovascular:     Rate and Rhythm: Normal rate and regular rhythm.     Pulses:          Dorsalis pedis pulses are 1+ on the right side and 2+ on the left side.       Posterior tibial pulses are 2+ on the right side and 2+ on the left side.     Heart sounds: Normal heart sounds. No murmur heard.    No friction rub. No gallop.  Pulmonary:     Effort: Pulmonary effort is normal.     Breath sounds: Normal breath sounds.  Abdominal:     General: Abdomen is flat. Bowel sounds are normal.     Palpations: Abdomen is soft.  Musculoskeletal:     Cervical back: Normal, normal range of motion and neck supple.     Thoracic back: Normal.     Lumbar back: Tenderness present. Positive right straight leg raise test. Negative left straight leg raise test.     Right lower leg: No edema.     Left lower leg: No edema.     Comments: Midline tenderness appreciated at the thoracolumbar junction  Feet:     Right foot:     Skin integrity: Skin integrity normal.     Toenail  Condition: Right toenails are normal.     Left foot:     Skin integrity: Skin integrity normal.     Toenail Condition: Left toenails are normal.  Skin:    General: Skin is warm and dry.     Capillary Refill: Capillary refill takes less than 2 seconds.  Neurological:     General: No focal deficit present.  Mental Status: He is alert. Mental status is at baseline.     Cranial Nerves: Cranial nerves 2-12 are intact.     Motor: Motor function is intact.     Coordination: Coordination is intact.     Deep Tendon Reflexes: Reflexes are normal and symmetric.     Comments: Loss of sensation noted to the right lateral malleolus along with the plantar aspect of the right foot laterally.  Ambulates without assistance using a normal gait.    Psychiatric:        Mood and Affect: Mood normal.     ED Results / Procedures / Treatments   Labs (all labs ordered are listed, but only abnormal results are displayed) Labs Reviewed - No data to display  EKG None  Radiology No results found.  Procedures Procedures    Medications Ordered in ED Medications - No data to display  ED Course/ Medical Decision Making/ A&P                                 Medical Decision Making Given his tenderness and pain located at the thoracolumbar junction, in concert with radicular numbness and paresthesia along the right lateral thigh and calf, likely this is due to lumbar radiculopathy.  Straight leg test is positive, and noted loss of sensation to the lateral right foot and ankle.  Will manage with single dose of IV ketorolac , and follow this with oral ibuprofen  as needed for pain.  Patient notes that he has a appointment set up with his primary care on Monday, 14 April.  He will follow-up outpatient for continued imaging.  At this time we will defer imaging to primary care.  Risk Prescription drug management.          Final Clinical Impression(s) / ED Diagnoses Final diagnoses:  None     Rx / DC Orders ED Discharge Orders     None         Juanetta Nordmann, PA 10/09/23 1237    Almond Army, MD 10/15/23 [phone]

## 2023-10-09 NOTE — Progress Notes (Signed)
 Virtual Visit Consent   Michael Barnett, you are scheduled for a virtual visit with a [facility] provider today. Just as with appointments in the office, your consent must be obtained to participate. Your consent will be active for this visit and any virtual visit you may have with one of our providers in the next 365 days. If you have a MyChart account, a copy of this consent can be sent to you electronically.  As this is a virtual visit, video technology does not allow for your provider to perform a traditional examination. This may limit your provider's ability to fully assess your condition. If your provider identifies any concerns that need to be evaluated in person or the need to arrange testing (such as labs, EKG, etc.), we will make arrangements to do so. Although advances in technology are sophisticated, we cannot ensure that it will always work on either your end or our end. If the connection with a video visit is poor, the visit may have to be switched to a telephone visit. With either a video or telephone visit, we are not always able to ensure that we have a secure connection.  By engaging in this virtual visit, you consent to the provision of healthcare and authorize for your insurance to be billed (if applicable) for the services provided during this visit. Depending on your insurance coverage, you may receive a charge related to this service.  I need to obtain your verbal consent now. Are you willing to proceed with your visit today? Michael Barnett has provided verbal consent on 10/09/2023 for a virtual visit (video or telephone). Albertha Huger, FNP  Date: 10/09/2023 10:08 AM   Virtual Visit via Video Note   I, Albertha Huger, connected with  Michael Barnett  ([id number][id number]) on 10/09/23 at 10:00 AM EDT by a video-enabled telemedicine application and verified that I am speaking with the correct person using two identifiers.  Location: Patient: Virtual Visit Location Patient:  Home Provider: Virtual Visit Location Provider: Home Office   I discussed the limitations of evaluation and management by telemedicine and the availability of in person appointments. The patient expressed understanding and agreed to proceed.    History of Present Illness: Michael Barnett is a 53 y.o. who identifies as a male who was assigned male at birth, and is being seen today for numbness and tingling in right leg and right hand with rt foot locked in place. He has a history of cva/ nki. 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) 07/25/2023   PVC (premature ventricular contraction) 07/25/2023   Paroxysmal atrial fibrillation (HCC) 07/25/2023   CAD (coronary artery disease) 07/25/2023   Hypertension 07/25/2023   Type 2 diabetes mellitus (HCC) 07/25/2023   History of CVA (cerebrovascular accident) 07/25/2023   VT (ventricular tachycardia) (HCC) 07/24/2023   PAD (peripheral artery disease) (HCC) 06/24/2023    Allergies:  Allergies  Allergen Reactions   Victoza [Liraglutide] Other (See Comments)    Gave pt pancreatitis   Medications:  Current Outpatient Medications:    albuterol (PROVENTIL) (2.5 MG/3ML) 0.083% nebulizer solution, Take 3 mLs (2.5 mg total) by nebulization every 6 (six) hours as needed for wheezing  or shortness of breath., Disp: 150 mL, Rfl: 1   albuterol (VENTOLIN HFA) 108 (90 Base) MCG/ACT inhaler, Inhale 1-2 puffs into the lungs every 6 (six) hours as needed for wheezing or shortness of breath., Disp: 1 each, Rfl: 0   apixaban (ELIQUIS) 5 MG TABS tablet, Take 1 tablet (5 mg total) by mouth 2 (two)  times daily., Disp: 180 tablet, Rfl: 3   aspirin 81 MG chewable tablet, Chew 1 tablet (81 mg total) by mouth daily., Disp: 90 tablet, Rfl: 3   atorvastatin (LIPITOR) 80 MG tablet, Take 1 tablet (80 mg total) by mouth daily., Disp: 90 tablet, Rfl: 3   Blood Pressure KIT, Use as needed., Disp: 1 kit, Rfl: 0   budesonide-formoterol (SYMBICORT) 80-4.5 MCG/ACT inhaler, Inhale 2 puffs into the lungs 2 (two) times daily., Disp: 1 each, Rfl: 5   Continuous Glucose Receiver (FREESTYLE LIBRE 3 READER) DEVI, UAD to monitor blood sugar, Disp: 1 each, Rfl: 0   Continuous Glucose Sensor (FREESTYLE LIBRE 3 PLUS SENSOR) MISC, Change sensor every 15 days., Disp: 2 each, Rfl: 6   empagliflozin (JARDIANCE) 25 MG TABS tablet, Take 1 tablet (25 mg total) by mouth daily., Disp: 90 tablet, Rfl: 2   Fingerstix Lancets MISC, Use as directed with testing supplies up to 4 times daily., Disp: 100 each, Rfl: 0   furosemide (LASIX) 40 MG tablet, Take 1 tablet (40 mg total) by mouth daily., Disp: 30 tablet, Rfl: 0   glucose blood test strip, Use as instructed, Disp: 100 each, Rfl: 12   insulin glargine (LANTUS) 100 UNIT/ML injection, Inject 15 Units into the skin 2 (two) times daily., Disp: , Rfl:    Lancet Devices (TRUEDRAW LANCING DEVICE) MISC, Use as directed up to four times daily, Disp: 1 each, Rfl: 0   metolazone (ZAROXOLYN) 2.5 MG tablet, Take 1 tablet (2.5 mg total) by mouth daily as needed (increasing leg swelling, shortness of breath or weight gain > 3lbs overnight)., Disp: 30 tablet, Rfl: 0   metoprolol succinate (TOPROL-XL) 25 MG 24 hr tablet, Take 1 tablet (25 mg total) by mouth daily., Disp: 90 tablet, Rfl: 0   nicotine (NICODERM CQ - DOSED IN MG/24 HOURS) 21 mg/24hr patch, Place 1 patch (21 mg total) onto the skin daily. (Patient not taking: Reported on 10/06/2023), Disp: 28 patch, Rfl: 1   nitroGLYCERIN (NITROSTAT) 0.4 MG SL tablet, Place 0.4 mg under the tongue every 5 (five) minutes as needed for chest pain., Disp:  , Rfl:    oxyCODONE-acetaminophen (PERCOCET) 10-325 MG tablet, Take 1 tablet by mouth every 4 (four) hours as needed for pain., Disp: , Rfl:    pantoprazole (PROTONIX) 40 MG tablet, Take 1 tablet (40 mg total) by mouth daily., Disp: 30 tablet, Rfl: 3   potassium chloride SA (KLOR-CON M) 20 MEQ tablet, Take 1 tablet (20 mEq total) by mouth daily., Disp: 30 tablet, Rfl: 0   sacubitril-valsartan (ENTRESTO) 24-26 MG, Take 1 tablet by mouth 2 (two) times daily., Disp: 60 tablet, Rfl: 3   sotalol (BETAPACE) 160 MG tablet, Take 1 tablet (160 mg total) by mouth 2 (two) times daily., Disp: 180 tablet, Rfl: 3   spironolactone (ALDACTONE) 25 MG tablet, Take 0.5 tablets (12.5 mg total) by mouth daily., Disp: 90 tablet, Rfl: 1  Observations/Objective: Patient is well-developed, well-nourished in no acute distress.  Resting comfortably  at home.  Head is normocephalic, atraumatic.  No labored breathing.  Speech is clear and coherent with logical content.  Patient is alert  and oriented at baseline.    Assessment and Plan: 1. Paresthesia (Primary)  2. History of CVA (cerebrovascular accident)  3. Spasm  Due to PMH he is advised to go to ED for further eval. He agrees.  Follow Up Instructions: I discussed the assessment and treatment plan with the patient. The patient was provided an opportunity to ask questions and all were answered. The patient agreed with the plan and demonstrated an understanding of the instructions.  A copy of instructions were sent to the patient via MyChart unless otherwise noted below.     The patient was advised to call back or seek an in-person evaluation if the symptoms worsen or if the condition fails to improve as anticipated.    Guila Owensby, FNP

## 2023-10-11 ENCOUNTER — Ambulatory Visit: Payer: Medicaid Other | Attending: Internal Medicine | Admitting: Internal Medicine

## 2023-10-11 ENCOUNTER — Other Ambulatory Visit (HOSPITAL_COMMUNITY): Payer: Self-pay

## 2023-10-11 VITALS — BP 111/77 | HR 61 | Temp 98.1°F | Ht 69.0 in | Wt 216.0 lb

## 2023-10-11 DIAGNOSIS — Z794 Long term (current) use of insulin: Secondary | ICD-10-CM | POA: Diagnosis not present

## 2023-10-11 DIAGNOSIS — I5042 Chronic combined systolic (congestive) and diastolic (congestive) heart failure: Secondary | ICD-10-CM

## 2023-10-11 DIAGNOSIS — J4540 Moderate persistent asthma, uncomplicated: Secondary | ICD-10-CM

## 2023-10-11 DIAGNOSIS — I480 Paroxysmal atrial fibrillation: Secondary | ICD-10-CM

## 2023-10-11 DIAGNOSIS — E1159 Type 2 diabetes mellitus with other circulatory complications: Secondary | ICD-10-CM

## 2023-10-11 DIAGNOSIS — Z7984 Long term (current) use of oral hypoglycemic drugs: Secondary | ICD-10-CM

## 2023-10-11 DIAGNOSIS — E119 Type 2 diabetes mellitus without complications: Secondary | ICD-10-CM

## 2023-10-11 DIAGNOSIS — E1165 Type 2 diabetes mellitus with hyperglycemia: Secondary | ICD-10-CM | POA: Diagnosis not present

## 2023-10-11 LAB — POCT GLYCOSYLATED HEMOGLOBIN (HGB A1C): HbA1c, POC (controlled diabetic range): 8.5 % — AB (ref 0.0–7.0)

## 2023-10-11 LAB — GLUCOSE, POCT (MANUAL RESULT ENTRY): POC Glucose: 253 mg/dL — AB (ref 70–99)

## 2023-10-11 MED ORDER — INSULIN GLARGINE 100 UNIT/ML ~~LOC~~ SOLN
18.0000 [IU] | Freq: Two times a day (BID) | SUBCUTANEOUS | 5 refills | Status: AC
  Filled 2023-10-11: qty 10, 28d supply, fill #0

## 2023-10-11 MED ORDER — SPIRONOLACTONE 25 MG PO TABS
12.5000 mg | ORAL_TABLET | Freq: Every day | ORAL | 1 refills | Status: DC
  Filled 2023-10-11: qty 90, 180d supply, fill #0

## 2023-10-11 MED ORDER — FREESTYLE LIBRE 3 PLUS SENSOR MISC
6 refills | Status: DC
  Filled 2023-10-11: qty 2, 28d supply, fill #0
  Filled 2023-11-23: qty 2, 30d supply, fill #1

## 2023-10-11 MED ORDER — POTASSIUM CHLORIDE CRYS ER 20 MEQ PO TBCR
20.0000 meq | EXTENDED_RELEASE_TABLET | Freq: Every day | ORAL | 1 refills | Status: DC
  Filled 2023-10-11: qty 90, 90d supply, fill #0

## 2023-10-11 MED ORDER — FUROSEMIDE 40 MG PO TABS
40.0000 mg | ORAL_TABLET | Freq: Every day | ORAL | 1 refills | Status: DC
  Filled 2023-10-11: qty 90, 90d supply, fill #0

## 2023-10-11 NOTE — Patient Instructions (Signed)
 VISIT SUMMARY:  You came in today for a follow-up visit to manage your diabetes, heart conditions, and asthma. You have been following your medication regimen and making positive changes to your diet. However, your blood sugar levels are still higher than the target range, and you recently experienced a hospitalization for congestive heart failure. Your asthma symptoms have improved with the use of your inhaler and nebulizer.  YOUR PLAN:  -TYPE 2 DIABETES MELLITUS: Type 2 diabetes is a condition where your body does not use insulin properly, leading to high blood sugar levels. Your HbA1c has improved but is still above the target. We will increase your Lantus insulin to 18 units twice daily and prescribe Freestyle Libre 3 sensors to help monitor your blood sugar levels. Please avoid sodas and monitor your blood sugar after consuming Crystal Light. Follow up with the clinical pharmacist in one month to recheck your blood sugar levels.  -CONGESTIVE HEART FAILURE: Congestive heart failure is a condition where your heart does not pump blood as well as it should. You were recently hospitalized for this condition. We will switch your medication from valsartan to Entresto to improve your heart function. Continue taking furosemide daily and use Metolazone as needed for swelling or if you gain more than 3 pounds overnight. We will also check your kidney function to ensure the diuretics are not causing issues.  -ATRIAL FIBRILLATION: Atrial fibrillation is an irregular and often rapid heart rate that can increase your risk of strokes. You are currently managing this condition with Eliquis and have not reported any bruising or bleeding. Please follow up with your cardiologist.  -ASTHMA: Asthma is a condition where your airways narrow and swell, making it difficult to breathe. Your symptoms have improved with the use of your inhaler and nebulizer. Continue using these as prescribed.  -GENERAL HEALTH MAINTENANCE:  You have not yet completed your colon cancer screening with Cologuard. Please complete and return the Cologuard test for colon cancer screening.  INSTRUCTIONS:  Follow up with the clinical pharmacist in one month to recheck your blood sugar levels. Discontinue valsartan and start Entresto after picking it up from the pharmacy. Follow up with your cardiologist for atrial fibrillation management. Complete and return the Cologuard test for colon cancer screening.

## 2023-10-11 NOTE — Progress Notes (Unsigned)
 Patient ID: Gayland Nicol, male    DOB: 03-09-1971  MRN: [id number]  CC: Diabetes (Dm f/u. Franchot Erichsen Valsartan & if continuation is needed/Requesting handicap placard/No to shingles vax)   Subjective: Renley Banwart is a 53 y.o. male who presents for chronic ds management. His concerns today include:  Patient with history of PAD, CAD status post CABG x 2/ICM/ICD, aortic atherosclerosis, CHF (EF 45 to 50%,GD III DD), DM type 2, HTN, OSA, CVA (pt does not recall date), tobacco dependence   Discussed the use of AI scribe software for clinical note transcription with the patient, who gave verbal consent to proceed.  History of Present Illness     Patient Active Problem List   Diagnosis Date Noted   OSA (obstructive sleep apnea) 10/06/2023   CHF exacerbation (HCC) 09/29/2023   Musculoskeletal chest pain 09/28/2023   Acute on chronic heart failure with preserved ejection fraction (HCC) 09/28/2023   Moderate persistent asthma without complication 08/26/2023   Ischemic cardiomyopathy with implantable cardioverter-defibrillator (ICD) 07/25/2023   PVC (premature ventricular contraction) 07/25/2023   Paroxysmal atrial fibrillation (HCC) 07/25/2023   CAD (coronary artery disease) 07/25/2023   Hypertension 07/25/2023   Type 2 diabetes mellitus (HCC) 07/25/2023   History of CVA (cerebrovascular accident) 07/25/2023   VT (ventricular tachycardia) (HCC) 07/24/2023   PAD (peripheral artery disease) (HCC) 06/24/2023     Current Outpatient Medications on File Prior to Visit  Medication Sig Dispense Refill   atorvastatin (LIPITOR) 80 MG tablet Take 1 tablet (80 mg total) by mouth daily. 90 tablet 3   empagliflozin (JARDIANCE) 25 MG TABS tablet Take 1 tablet (25 mg total) by mouth daily. 90 tablet 2   furosemide (LASIX) 40 MG tablet Take 1 tablet (40 mg total) by mouth daily. 30 tablet 0   metolazone (ZAROXOLYN) 2.5 MG tablet Take 1 tablet (2.5 mg total) by mouth daily as needed (increasing  leg swelling, shortness of breath or weight gain > 3lbs overnight). 30 tablet 0   metoprolol succinate (TOPROL-XL) 25 MG 24 hr tablet Take 1 tablet (25 mg total) by mouth daily. 90 tablet 0   potassium chloride SA (KLOR-CON M) 20 MEQ tablet Take 1 tablet (20 mEq total) by mouth daily. 30 tablet 0   sotalol (BETAPACE) 160 MG tablet Take 1 tablet (160 mg total) by mouth 2 (two) times daily. 180 tablet 3   spironolactone (ALDACTONE) 25 MG tablet Take 0.5 tablets (12.5 mg total) by mouth daily. 90 tablet 1   valsartan (DIOVAN) 40 MG tablet Take 40 mg by mouth daily.     albuterol (PROVENTIL) (2.5 MG/3ML) 0.083% nebulizer solution Take 3 mLs (2.5 mg total) by nebulization every 6 (six) hours as needed for wheezing or shortness of breath. (Patient not taking: Reported on 10/11/2023) 150 mL 1   albuterol (VENTOLIN HFA) 108 (90 Base) MCG/ACT inhaler Inhale 1-2 puffs into the lungs every 6 (six) hours as needed for wheezing or shortness of breath. (Patient not taking: Reported on 10/11/2023) 1 each 0   apixaban (ELIQUIS) 5 MG TABS tablet Take 1 tablet (5 mg total) by mouth 2 (two) times daily. (Patient not taking: Reported on 10/11/2023) 180 tablet 3   aspirin 81 MG chewable tablet Chew 1 tablet (81 mg total) by mouth daily. (Patient not taking: Reported on 10/11/2023) 90 tablet 3   Blood Pressure KIT Use as needed. (Patient not taking: Reported on 10/11/2023) 1 kit 0   budesonide-formoterol (SYMBICORT) 80-4.5 MCG/ACT inhaler Inhale 2 puffs into the lungs 2 (  two) times daily. (Patient not taking: Reported on 10/11/2023) 1 each 5   Continuous Glucose Receiver (FREESTYLE LIBRE 3 READER) DEVI UAD to monitor blood sugar (Patient not taking: Reported on 10/11/2023) 1 each 0   Continuous Glucose Sensor (FREESTYLE LIBRE 3 PLUS SENSOR) MISC Change sensor every 15 days. (Patient not taking: Reported on 10/11/2023) 2 each 6   Fingerstix Lancets MISC Use as directed with testing supplies up to 4 times daily. (Patient not taking:  Reported on 10/11/2023) 100 each 0   glucose blood test strip Use as instructed (Patient not taking: Reported on 10/11/2023) 100 each 12   ibuprofen (ADVIL) 600 MG tablet Take 1 tablet (600 mg total) by mouth every 6 (six) hours as needed. (Patient not taking: Reported on 10/11/2023) 30 tablet 0   insulin glargine (LANTUS) 100 UNIT/ML injection Inject 15 Units into the skin 2 (two) times daily. (Patient not taking: Reported on 10/11/2023)     Lancet Devices (TRUEDRAW LANCING DEVICE) MISC Use as directed up to four times daily (Patient not taking: Reported on 10/11/2023) 1 each 0   nicotine (NICODERM CQ - DOSED IN MG/24 HOURS) 21 mg/24hr patch Place 1 patch (21 mg total) onto the skin daily. (Patient not taking: Reported on 10/05/2023) 28 patch 1   nitroGLYCERIN (NITROSTAT) 0.4 MG SL tablet Place 0.4 mg under the tongue every 5 (five) minutes as needed for chest pain. (Patient not taking: Reported on 10/11/2023)     oxyCODONE-acetaminophen (PERCOCET) 10-325 MG tablet Take 1 tablet by mouth every 4 (four) hours as needed for pain. (Patient not taking: Reported on 10/11/2023)     pantoprazole (PROTONIX) 40 MG tablet Take 1 tablet (40 mg total) by mouth daily. (Patient not taking: Reported on 10/11/2023) 30 tablet 3   sacubitril-valsartan (ENTRESTO) 24-26 MG Take 1 tablet by mouth 2 (two) times daily. (Patient not taking: Reported on 10/11/2023) 60 tablet 3   No current facility-administered medications on file prior to visit.    Allergies  Allergen Reactions   Victoza [Liraglutide] Other (See Comments)    Gave pt pancreatitis    Social History   Socioeconomic History   Marital status: Single    Spouse name: Not on file   Number of children: 3   Years of education: Not on file   Highest education level: Associate degree: academic program  Occupational History   Occupation: Disabilty  Tobacco Use   Smoking status: Former    Current packs/day: 0.00    Average packs/day: 0.5 packs/day for 35.0 years  (17.5 ttl pk-yrs)    Types: Cigarettes    Start date: 88    Quit date: 06/30/2023    Years since quitting: 0.2   Smokeless tobacco: Not on file  Vaping Use   Vaping status: Never Used  Substance and Sexual Activity   Alcohol use: No   Drug use: Never   Sexual activity: Yes  Other Topics Concern   Not on file  Social History Narrative   Not on file   Social Drivers of Health   Financial Resource Strain: Low Risk  (09/30/2023)   Overall Financial Resource Strain (CARDIA)    Difficulty of Paying Living Expenses: Not very hard  Food Insecurity: No Food Insecurity (10/05/2023)   Hunger Vital Sign    Worried About Running Out of Food in the Last Year: Never true    Ran Out of Food in the Last Year: Never true  Transportation Needs: No Transportation Needs (10/05/2023)   PRAPARE - Transportation  Lack of Transportation (Medical): No    Lack of Transportation (Non-Medical): No  Physical Activity: Not on file  Stress: Not on file  Social Connections: Not on file  Intimate Partner Violence: Not At Risk (10/05/2023)   Humiliation, Afraid, Rape, and Kick questionnaire    Fear of Current or Ex-Partner: No    Emotionally Abused: No    Physically Abused: No    Sexually Abused: No    Family History  Problem Relation Age of Onset   Heart attack Mother    Heart attack Father    Heart attack Maternal Uncle     Past Surgical History:  Procedure Laterality Date   ABDOMINAL AORTOGRAM W/LOWER EXTREMITY N/A 06/04/2023   Procedure: ABDOMINAL AORTOGRAM W/LOWER EXTREMITY;  Surgeon: Leonie Douglas, MD;  Location: [facility] INVASIVE CV LAB;  Service: Cardiovascular;  Laterality: N/A;   CORONARY ARTERY BYPASS GRAFT     FEMORAL-POPLITEAL BYPASS GRAFT Left 06/24/2023   Procedure: BYPASS GRAFT FEMORAL- ABOVE KNEE POPLITEAL ARTERY;  Surgeon: Leonie Douglas, MD;  Location: [facility] OR;  Service: Vascular;  Laterality: Left;   RIGHT/LEFT HEART CATH AND CORONARY/GRAFT ANGIOGRAPHY N/A 06/15/2023   Procedure:  RIGHT/LEFT HEART CATH AND CORONARY/GRAFT ANGIOGRAPHY;  Surgeon: Tonny Bollman, MD;  Location: [facility] INVASIVE CV LAB;  Service: Cardiovascular;  Laterality: N/A;    ROS: Review of Systems Negative except as stated above  PHYSICAL EXAM: BP (!) 93/58 (BP Location: Left Arm, Patient Position: Sitting, Cuff Size: Normal)   Pulse 61   Temp 98.1 F (36.7 C) (Oral)   Ht 5\' 9"  (1.753 m)   Wt 216 lb (98 kg)   SpO2 98%   BMI 31.90 kg/m   Physical Exam  {male adult master:310786} {male adult master:310785}     Latest Ref Rng & Units 10/06/2023   12:08 PM 09/30/2023    5:12 AM 09/29/2023    4:37 AM  CMP  Glucose 70 - 99 mg/dL 161  096  045   BUN 6 - 20 mg/dL 24  24  20    Creatinine 0.61 - 1.24 mg/dL 4.09  8.11  9.14   Sodium 135 - 145 mmol/L 137  139  139   Potassium 3.5 - 5.1 mmol/L 4.0  3.8  3.6   Chloride 98 - 111 mmol/L 103  104  105   CO2 22 - 32 mmol/L 25  25  26    Calcium 8.9 - 10.3 mg/dL 8.7  8.3  8.3    Lipid Panel     Component Value Date/Time   CHOL 103 06/25/2023 0342   TRIG 75 06/25/2023 0342   HDL 29 (L) 06/25/2023 0342   CHOLHDL 3.6 06/25/2023 0342   VLDL 15 06/25/2023 0342   LDLCALC 59 06/25/2023 0342    CBC    Component Value Date/Time   WBC 7.4 09/28/2023 0049   RBC 5.07 09/28/2023 0049   HGB 15.3 09/28/2023 0049   HGB 15.7 06/09/2023 1055   HCT 48.8 09/28/2023 0049   HCT 49.0 06/09/2023 1055   PLT 136 (L) 09/28/2023 0049   PLT 115 (L) 06/09/2023 1055   MCV 96.3 09/28/2023 0049   MCV 97 06/09/2023 1055   MCH 30.2 09/28/2023 0049   MCHC 31.4 09/28/2023 0049   RDW 14.9 09/28/2023 0049   RDW 12.2 06/09/2023 1055   LYMPHSABS 1.8 08/06/2023 0823   MONOABS 0.7 08/06/2023 0823   EOSABS 0.1 08/06/2023 0823   BASOSABS 0.1 08/06/2023 0823    ASSESSMENT AND PLAN:  Assessment and Plan  Assessment & Plan      1. Type 2 diabetes mellitus with hyperglycemia, with long-term current use of insulin (HCC) (Primary) *** - POCT glycosylated hemoglobin (Hb  A1C) - POCT glucose (manual entry)    Patient was given the opportunity to ask questions.  Patient verbalized understanding of the plan and was able to repeat key elements of the plan.   This documentation was completed using Paediatric nurse.  Any transcriptional errors are unintentional.  Orders Placed This Encounter  Procedures   POCT glycosylated hemoglobin (Hb A1C)   POCT glucose (manual entry)     Requested Prescriptions   Pending Prescriptions Disp Refills   potassium chloride SA (KLOR-CON M) 20 MEQ tablet 90 tablet 0    Sig: Take 1 tablet (20 mEq total) by mouth daily.   metolazone (ZAROXOLYN) 2.5 MG tablet 30 tablet 0    Sig: Take 1 tablet (2.5 mg total) by mouth daily as needed (increasing leg swelling, shortness of breath or weight gain > 3lbs overnight).   metoprolol succinate (TOPROL-XL) 25 MG 24 hr tablet 90 tablet 1    Sig: Take 1 tablet (25 mg total) by mouth daily.   furosemide (LASIX) 40 MG tablet 90 tablet 1    Sig: Take 1 tablet (40 mg total) by mouth daily.   spironolactone (ALDACTONE) 25 MG tablet 90 tablet 1    Sig: Take 0.5 tablets (12.5 mg total) by mouth daily.    No follow-ups on file.  Concetta Dee, MD, FACP

## 2023-10-12 ENCOUNTER — Ambulatory Visit: Payer: Medicaid Other | Admitting: Cardiology

## 2023-10-12 LAB — BASIC METABOLIC PANEL WITH GFR
BUN/Creatinine Ratio: 21 — ABNORMAL HIGH (ref 9–20)
BUN: 27 mg/dL — ABNORMAL HIGH (ref 6–24)
CO2: 18 mmol/L — ABNORMAL LOW (ref 20–29)
Calcium: 9.1 mg/dL (ref 8.7–10.2)
Chloride: 103 mmol/L (ref 96–106)
Creatinine, Ser: 1.27 mg/dL (ref 0.76–1.27)
Glucose: 253 mg/dL — ABNORMAL HIGH (ref 70–99)
Potassium: 4.6 mmol/L (ref 3.5–5.2)
Sodium: 138 mmol/L (ref 134–144)
eGFR: 68 mL/min/{1.73_m2} (ref >59)

## 2023-10-13 ENCOUNTER — Encounter: Payer: Self-pay | Admitting: Internal Medicine

## 2023-10-14 ENCOUNTER — Ambulatory Visit (HOSPITAL_COMMUNITY)
Admission: RE | Admit: 2023-10-14 | Discharge: 2023-10-14 | Disposition: A | Discharge status: Home / Self Care | Source: Ambulatory Visit | Attending: Cardiology | Admitting: Cardiology

## 2023-10-14 DIAGNOSIS — I5022 Chronic systolic (congestive) heart failure: Secondary | ICD-10-CM | POA: Diagnosis present

## 2023-10-14 LAB — BASIC METABOLIC PANEL WITH GFR
Anion gap: 7 (ref 5–15)
BUN: 27 mg/dL — ABNORMAL HIGH (ref 6–20)
CO2: 23 mmol/L (ref 22–32)
Calcium: 8.5 mg/dL — ABNORMAL LOW (ref 8.9–10.3)
Chloride: 108 mmol/L (ref 98–111)
Creatinine, Ser: 1.23 mg/dL (ref 0.61–1.24)
GFR, Estimated: >60 mL/min (ref >60)
Glucose, Bld: 157 mg/dL — ABNORMAL HIGH (ref 70–99)
Potassium: 4 mmol/L (ref 3.5–5.1)
Sodium: 138 mmol/L (ref 135–145)

## 2023-10-18 ENCOUNTER — Encounter (HOSPITAL_BASED_OUTPATIENT_CLINIC_OR_DEPARTMENT_OTHER): Payer: Self-pay

## 2023-10-20 ENCOUNTER — Other Ambulatory Visit (HOSPITAL_COMMUNITY)

## 2023-10-22 ENCOUNTER — Telehealth: Payer: Self-pay | Admitting: Podiatry

## 2023-10-22 NOTE — Telephone Encounter (Signed)
 LVM cancel DSM appt; no new appts due to staffing

## 2023-11-02 ENCOUNTER — Telehealth (HOSPITAL_COMMUNITY): Payer: Self-pay | Admitting: Internal Medicine

## 2023-11-02 NOTE — Progress Notes (Unsigned)
 Cardiology Office Note:   Date:  11/03/2023  ID:  Michael Barnett, DOB Dec 25, 1970, MRN [id number] PCP:  Lawrance Presume, MD  [facility] Providers Cardiologist:  Alyssa Backbone, MD Referring MD: Lawrance Presume, MD  Chief Complaint/Reason for Referral: Follow-up for ischemic cardiomyopathy and coronary artery disease ASSESSMENT:    1. Ischemic cardiomyopathy with implantable cardioverter-defibrillator (ICD)   2. Coronary artery disease involving native coronary artery of native heart without angina pectoris   3. Type 2 diabetes mellitus with complication, with long-term current use of insulin  (HCC)   4. Hypertension associated with diabetes (HCC)   5. Hyperlipidemia associated with type 2 diabetes mellitus (HCC)   6. Aortic atherosclerosis (HCC)   7. Peripheral arterial disease (HCC)   8. ICD (implantable cardioverter-defibrillator) in place   9. PAF (paroxysmal atrial fibrillation) (HCC)   10. Secondary hypercoagulable state (HCC)   11. BMI 32.0-32.9,adult      PLAN:   In order of problems listed above: Ischemic cardiomyopathy: He has had some lightheadedness.  Will decrease spironolactone  to 12.5 mg daily..  Continue Jardiance  25 mg daily, Toprol  25 mg daily, Entresto  24 x 26 twice daily.  Having issues with peripheral edema.  Increase Lasix  to 40 mg twice daily. CAD: Continue Eliquis  5 mg twice daily, Toprol  25 mg daily, Lipitor  80 mg daily; stop aspirin  81 mg.   Type 2 diabetes mellitus: Continue Eliquis  for 5 mg twice daily, atorvastatin  80 mg daily, Entresto  24 x 26 mg twice daily, and Jardiance  25 mg daily. Hypertension: Continue Toprol  25 mg daily, Entresto  24 x 26 mg twice daily, and spironolactone  25 mg daily Hyperlipidemia: Continue atorvastatin  80 mg; goal LDL is less than 55.  Check lipid panel, LFTs, LP(a) today.   Aortic atherosclerosis: Continue atorvastatin  80 mg and Eliquis  5 mg twice daily.   Peripheral arterial disease: Underwent left femoral popliteal  bypass for CLI.  Followed by vascular surgery.  Continue aggressive risk factor modification.   ICD: Inappropriate shock due to atrial fibrillation.  Followed by EP.  No further ICD shocks. Paroxysmal atrial fibrillation: Continue Eliquis  5 mg twice daily, Toprol  25 mg daily, and sotalol  160 mg twice daily.   Secondary hypercoagulable state: Continue Eliquis  5 mg twice daily.   Elevated BMI: Refer to pharmacy for recommendations regarding GLP-1 receptor agonist therapy.       Informed Consent       I spent 33 minutes reviewing all clinical data during and prior to this visit including all relevant imaging studies, laboratories, clinical information from other health systems and prior notes from both Cardiology and other specialties, interviewing the patient, conducting a complete physical examination, and coordinating care in order to formulate a comprehensive and personalized evaluation and treatment plan.   Dispo: Follow-up in 6 months     Medication Adjustments/Labs and Tests Ordered: Current medicines are reviewed at length with the patient today.  Concerns regarding medicines are outlined above.  The following changes have been made:  no change   Labs/tests ordered: Orders Placed This Encounter  Procedures   Lipoprotein A (LPA)   Lipid panel   Hepatic function panel   AMB Referral to [facility]care Pharm-D    Medication Changes: Meds ordered this encounter  Medications   spironolactone  (ALDACTONE ) 25 MG tablet    Sig: Take 0.5 tablets (12.5 mg total) by mouth daily.    Dispense:  45 tablet    Refill:  3   furosemide  (LASIX ) 40 MG tablet    Sig: Take 1  tablet (40 mg total) by mouth 2 (two) times daily.    Dispense:  180 tablet    Refill:  3    Current medicines are reviewed at length with the patient today.  The patient does not have concerns regarding medicines.     History of Present Illness:      FOCUSED PROBLEM LIST:   Aortic atherosclerosis CT abdomen pelvis  2023 Hyperlipidemia Type 2 diabetes mellitus On insulin  Hypertension Peripheral arterial disease ([place]) CLI with occluded left SFA status post left femoropopliteal bypass December 2024 BMI 32 CAD S/p CABG x 2 LIMA to LAD and vein graft to obtuse marginal 2013 PCI RCA 2016 Cath 2024 100% mid LAD, 90% pLAD, 100% Lcx, 100% PDA with patent LIMA to LAD and occluded VG to OM Ischemic cardiomyopathy status post BiV-ICD EF 30 to 35% TTE September 2024 [facility] G3 DD, no significant valve issues EF 30 to 35% TTE April 2025 CVA CKD stage II PAF On Eliquis  Left bundle branch block  12/24: The patient is a 53 year old male with the above listed medical problems referred for preoperative cardiovascular evaluation prior to vascular surgery.  The patient was seen by Dr. Edgardo Goodwill recently due to critical limb ischemia with a left lower extremity ulceration.  He underwent peripheral angiography which demonstrated an occluded left SFA.  Due to this, he is planned to undergo left femoral popliteal peripheral bypass.  He is referred for preoperative cardiac evaluation.  The patient has a pretty extensive cardiovascular history and most recently presented to an outside hospital in September due to hiccups.  His troponins were mildly elevated thought to be due to demand ischemia.  On review of his records his last heart catheterization was done in May 2022 which demonstrated severe obliterative native vessel disease with a patent LIMA to LAD and occluded vein graft to obtuse marginal.  There were no revascularization options available.  Fortunately the patient denies any chest pain or significant shortness of breath.  He is fairly limited in terms of ambulation due to his arthritis and peripheral vascular disease.  We interrogated his ICD today which demonstrated no ATP or shocks with occasional NSVT.  Plan: Proceed with vascular surgery.  May 2025:  Patient consents to use of AI scribe. The patient underwent  left femoropopliteal bypass in December without complications.  He presented in late January with ICD discharge.  This was thought to be an inappropriate shock from rapid AF.  He was started on Eliquis .  He then presented in April with acute on chronic systolic heart failure.  He was treated with diuretics and discharged home.    He experiences lightheadedness when sitting down after walking, occurring even without transitioning from sitting to standing. He has been taking a full pill of spironolactone  instead of the prescribed half pill, which may contribute to his symptoms.  He has swelling in his legs, which worsens with travel and prolonged sitting, such as during car rides. He takes a diuretic, which helps reduce the swelling, and has been using metolazone  every day or every other day due to increased swelling after a recent trip. His legs swell significantly during travel, prompting him to use the train for easier access to the bathroom.  He was hospitalized about a month and a half ago at [facility] for severe fluid retention and numbness, where he was treated for atrial fibrillation and received a shock from his defibrillator. He was started on a blood thinner and sotalol  during this hospitalization. He experiences  episodes of rapid heartbeats, described as 'doo, doo, doo, doo,' occurring about three times a week, which he can feel in his neck. He is currently on metoprolol  for this condition.  No recent hospitalizations or emergency room visits since the last encounter, except for the one mentioned above. No problems with breathing while lying flat and no recent shocks from his defibrillator.     Current Medications: Current Meds  Medication Sig   albuterol  (PROVENTIL ) (2.5 MG/3ML) 0.083% nebulizer solution Take 3 mLs (2.5 mg total) by nebulization every 6 (six) hours as needed for wheezing or shortness of breath.   albuterol  (VENTOLIN  HFA) 108 (90 Base) MCG/ACT inhaler Inhale 1-2 puffs into  the lungs every 6 (six) hours as needed for wheezing or shortness of breath.   apixaban  (ELIQUIS ) 5 MG TABS tablet Take 1 tablet (5 mg total) by mouth 2 (two) times daily.   atorvastatin  (LIPITOR ) 80 MG tablet Take 1 tablet (80 mg total) by mouth daily.   Blood Pressure KIT Use as needed.   budesonide -formoterol  (SYMBICORT ) 80-4.5 MCG/ACT inhaler Inhale 2 puffs into the lungs 2 (two) times daily.   Continuous Glucose Receiver (FREESTYLE LIBRE 3 READER) DEVI UAD to monitor blood sugar   Continuous Glucose Sensor (FREESTYLE LIBRE 3 PLUS SENSOR) MISC Change sensor every 15 days.   empagliflozin  (JARDIANCE ) 25 MG TABS tablet Take 1 tablet (25 mg total) by mouth daily.   Fingerstix Lancets MISC Use as directed with testing supplies up to 4 times daily.   furosemide  (LASIX ) 40 MG tablet Take 1 tablet (40 mg total) by mouth 2 (two) times daily.   glucose blood test strip Use as instructed   insulin  glargine (LANTUS ) 100 UNIT/ML injection Inject 18 Units total into the skin 2 (two) times daily.   Lancet Devices (TRUEDRAW LANCING DEVICE) MISC Use as directed up to four times daily   metoprolol  succinate (TOPROL -XL) 25 MG 24 hr tablet Take 1 tablet (25 mg total) by mouth daily.   nicotine  (NICODERM CQ  - DOSED IN MG/24 HOURS) 21 mg/24hr patch Place 1 patch (21 mg total) onto the skin daily.   nitroGLYCERIN  (NITROSTAT ) 0.4 MG SL tablet Place 0.4 mg under the tongue every 5 (five) minutes as needed for chest pain.   oxyCODONE -acetaminophen  (PERCOCET) 10-325 MG tablet Take 1 tablet by mouth every 4 (four) hours as needed for pain.   pantoprazole  (PROTONIX ) 40 MG tablet Take 1 tablet (40 mg total) by mouth daily.   potassium chloride  SA (KLOR-CON  M) 20 MEQ tablet Take 1 tablet (20 mEq total) by mouth daily.   sacubitril -valsartan  (ENTRESTO ) 24-26 MG Take 1 tablet by mouth 2 (two) times daily.   sotalol  (BETAPACE ) 160 MG tablet Take 1 tablet (160 mg total) by mouth 2 (two) times daily.   spironolactone   (ALDACTONE ) 25 MG tablet Take 0.5 tablets (12.5 mg total) by mouth daily.   [DISCONTINUED] aspirin  81 MG chewable tablet Chew 1 tablet (81 mg total) by mouth daily.   [DISCONTINUED] furosemide  (LASIX ) 40 MG tablet Take 1 tablet (40 mg total) by mouth daily.   [DISCONTINUED] spironolactone  (ALDACTONE ) 25 MG tablet Take 0.5 tablets (12.5 mg total) by mouth daily.     Review of Systems:   Please see the history of present illness.    All other systems reviewed and are negative.     EKGs/Labs/Other Test Reviewed:   EKG: April 2025 sinus rhythm with left bundle branch block  EKG Interpretation Date/Time:    Ventricular Rate:    PR  Interval:    QRS Duration:    QT Interval:    QTC Calculation:   R Axis:      Text Interpretation:           Risk Assessment/Calculations:          Physical Exam:   VS:  BP 96/65   Pulse 63   Ht 5\' 9"  (1.753 m)   Wt 212 lb 6.4 oz (96.3 kg)   SpO2 97%   BMI 31.37 kg/m        Wt Readings from Last 3 Encounters:  11/03/23 212 lb 6.4 oz (96.3 kg)  10/11/23 216 lb (98 kg)  10/09/23 217 lb (98.4 kg)      GENERAL:  No apparent distress, AOx3 HEENT:  No carotid bruits, +2 carotid impulses, no scleral icterus, JVP around 8 cm CAR: RRR no murmurs, gallops, rubs, or thrills RES:  Clear to auscultation bilaterally ABD:  Soft, nontender, nondistended, positive bowel sounds x 4 VASC:  +2 radial pulses, +2 carotid pulses NEURO:  CN 2-12 grossly intact; motor and sensory grossly intact PSYCH:  No active depression or anxiety EXT:  Ulcer left first toe; +1 peripheral edema  Signed, Kyra Phy, MD  11/03/2023 11:14 AM    [facility] [address] [place], [place], [place]  [address] Phone: [phone]; Fax: [phone]   Note:  This document was prepared using Dragon voice recognition software and may include unintentional dictation errors.

## 2023-11-02 NOTE — Telephone Encounter (Signed)
 Called to confirm/remind patient of their appointment at [facility] on 11/02/2023.   Appointment:   [x] Confirmed  [] Left mess   [] No answer/No voice mail  [] VM Full/unable to leave message  [] Phone not in service  Patient reminded to bring all medications and/or complete list.  Confirmed patient has transportation. Gave directions, instructed to utilize valet parking.

## 2023-11-03 ENCOUNTER — Encounter (HOSPITAL_COMMUNITY): Payer: Self-pay | Admitting: Internal Medicine

## 2023-11-03 ENCOUNTER — Ambulatory Visit (INDEPENDENT_AMBULATORY_CARE_PROVIDER_SITE_OTHER): Payer: Medicaid Other | Admitting: Podiatry

## 2023-11-03 ENCOUNTER — Other Ambulatory Visit: Payer: Medicaid Other

## 2023-11-03 ENCOUNTER — Ambulatory Visit (HOSPITAL_COMMUNITY)
Admission: RE | Admit: 2023-11-03 | Discharge: 2023-11-03 | Disposition: A | Discharge status: Home / Self Care | Source: Ambulatory Visit | Attending: Internal Medicine | Admitting: Internal Medicine

## 2023-11-03 ENCOUNTER — Other Ambulatory Visit (HOSPITAL_COMMUNITY): Payer: Self-pay

## 2023-11-03 ENCOUNTER — Encounter: Payer: Self-pay | Admitting: Podiatry

## 2023-11-03 ENCOUNTER — Ambulatory Visit: Admitting: Internal Medicine

## 2023-11-03 ENCOUNTER — Encounter: Payer: Self-pay | Admitting: Internal Medicine

## 2023-11-03 VITALS — BP 96/65 | HR 63 | Ht 69.0 in | Wt 212.4 lb

## 2023-11-03 VITALS — BP 90/50 | HR 62 | Wt 213.0 lb

## 2023-11-03 DIAGNOSIS — I5022 Chronic systolic (congestive) heart failure: Secondary | ICD-10-CM

## 2023-11-03 DIAGNOSIS — I2510 Atherosclerotic heart disease of native coronary artery without angina pectoris: Secondary | ICD-10-CM

## 2023-11-03 DIAGNOSIS — I739 Peripheral vascular disease, unspecified: Secondary | ICD-10-CM

## 2023-11-03 DIAGNOSIS — I255 Ischemic cardiomyopathy: Secondary | ICD-10-CM | POA: Insufficient documentation

## 2023-11-03 DIAGNOSIS — I480 Paroxysmal atrial fibrillation: Secondary | ICD-10-CM

## 2023-11-03 DIAGNOSIS — Z87891 Personal history of nicotine dependence: Secondary | ICD-10-CM | POA: Insufficient documentation

## 2023-11-03 DIAGNOSIS — Z7984 Long term (current) use of oral hypoglycemic drugs: Secondary | ICD-10-CM | POA: Insufficient documentation

## 2023-11-03 DIAGNOSIS — I110 Hypertensive heart disease with heart failure: Secondary | ICD-10-CM | POA: Insufficient documentation

## 2023-11-03 DIAGNOSIS — Z4502 Encounter for adjustment and management of automatic implantable cardiac defibrillator: Secondary | ICD-10-CM | POA: Insufficient documentation

## 2023-11-03 DIAGNOSIS — E1159 Type 2 diabetes mellitus with other circulatory complications: Secondary | ICD-10-CM | POA: Diagnosis not present

## 2023-11-03 DIAGNOSIS — E1142 Type 2 diabetes mellitus with diabetic polyneuropathy: Secondary | ICD-10-CM | POA: Diagnosis not present

## 2023-11-03 DIAGNOSIS — E1169 Type 2 diabetes mellitus with other specified complication: Secondary | ICD-10-CM | POA: Insufficient documentation

## 2023-11-03 DIAGNOSIS — I700 Atherosclerosis of aorta: Secondary | ICD-10-CM | POA: Insufficient documentation

## 2023-11-03 DIAGNOSIS — E1151 Type 2 diabetes mellitus with diabetic peripheral angiopathy without gangrene: Secondary | ICD-10-CM | POA: Insufficient documentation

## 2023-11-03 DIAGNOSIS — Z95810 Presence of automatic (implantable) cardiac defibrillator: Secondary | ICD-10-CM | POA: Insufficient documentation

## 2023-11-03 DIAGNOSIS — E785 Hyperlipidemia, unspecified: Secondary | ICD-10-CM

## 2023-11-03 DIAGNOSIS — D6869 Other thrombophilia: Secondary | ICD-10-CM | POA: Insufficient documentation

## 2023-11-03 DIAGNOSIS — M79674 Pain in right toe(s): Secondary | ICD-10-CM

## 2023-11-03 DIAGNOSIS — G4733 Obstructive sleep apnea (adult) (pediatric): Secondary | ICD-10-CM | POA: Diagnosis not present

## 2023-11-03 DIAGNOSIS — Z79899 Other long term (current) drug therapy: Secondary | ICD-10-CM | POA: Diagnosis not present

## 2023-11-03 DIAGNOSIS — Z794 Long term (current) use of insulin: Secondary | ICD-10-CM | POA: Diagnosis not present

## 2023-11-03 DIAGNOSIS — M79675 Pain in left toe(s): Secondary | ICD-10-CM

## 2023-11-03 DIAGNOSIS — Z7985 Long-term (current) use of injectable non-insulin antidiabetic drugs: Secondary | ICD-10-CM | POA: Diagnosis not present

## 2023-11-03 DIAGNOSIS — R600 Localized edema: Secondary | ICD-10-CM | POA: Diagnosis not present

## 2023-11-03 DIAGNOSIS — E118 Type 2 diabetes mellitus with unspecified complications: Secondary | ICD-10-CM

## 2023-11-03 DIAGNOSIS — L84 Corns and callosities: Secondary | ICD-10-CM | POA: Diagnosis not present

## 2023-11-03 DIAGNOSIS — Z6832 Body mass index (BMI) 32.0-32.9, adult: Secondary | ICD-10-CM | POA: Insufficient documentation

## 2023-11-03 DIAGNOSIS — R0789 Other chest pain: Secondary | ICD-10-CM | POA: Insufficient documentation

## 2023-11-03 DIAGNOSIS — Z7901 Long term (current) use of anticoagulants: Secondary | ICD-10-CM | POA: Diagnosis not present

## 2023-11-03 DIAGNOSIS — Z951 Presence of aortocoronary bypass graft: Secondary | ICD-10-CM | POA: Diagnosis not present

## 2023-11-03 DIAGNOSIS — I493 Ventricular premature depolarization: Secondary | ICD-10-CM | POA: Diagnosis not present

## 2023-11-03 DIAGNOSIS — B351 Tinea unguium: Secondary | ICD-10-CM | POA: Diagnosis not present

## 2023-11-03 DIAGNOSIS — I152 Hypertension secondary to endocrine disorders: Secondary | ICD-10-CM

## 2023-11-03 DIAGNOSIS — I5042 Chronic combined systolic (congestive) and diastolic (congestive) heart failure: Secondary | ICD-10-CM

## 2023-11-03 LAB — COMPREHENSIVE METABOLIC PANEL WITH GFR
ALT: 18 U/L (ref 0–44)
AST: 16 U/L (ref 15–41)
Albumin: 3.6 g/dL (ref 3.5–5.0)
Alkaline Phosphatase: 92 U/L (ref 38–126)
Anion gap: 14 (ref 5–15)
BUN: 30 mg/dL — ABNORMAL HIGH (ref 6–20)
CO2: 26 mmol/L (ref 22–32)
Calcium: 9.2 mg/dL (ref 8.9–10.3)
Chloride: 97 mmol/L — ABNORMAL LOW (ref 98–111)
Creatinine, Ser: 1.57 mg/dL — ABNORMAL HIGH (ref 0.61–1.24)
GFR, Estimated: 52 mL/min — ABNORMAL LOW (ref >60)
Glucose, Bld: 245 mg/dL — ABNORMAL HIGH (ref 70–99)
Potassium: 4.4 mmol/L (ref 3.5–5.1)
Sodium: 137 mmol/L (ref 135–145)
Total Bilirubin: 1.1 mg/dL (ref 0.0–1.2)
Total Protein: 6.7 g/dL (ref 6.5–8.1)

## 2023-11-03 LAB — LIPID PANEL
Cholesterol: 116 mg/dL (ref 0–200)
HDL: 27 mg/dL — ABNORMAL LOW (ref >40)
LDL Cholesterol: 70 mg/dL (ref 0–99)
Total CHOL/HDL Ratio: 4.3 ratio
Triglycerides: 93 mg/dL (ref <150)
VLDL: 19 mg/dL (ref 0–40)

## 2023-11-03 LAB — MAGNESIUM: Magnesium: 2.5 mg/dL — ABNORMAL HIGH (ref 1.7–2.4)

## 2023-11-03 LAB — BRAIN NATRIURETIC PEPTIDE: B Natriuretic Peptide: 178 pg/mL — ABNORMAL HIGH (ref 0.0–100.0)

## 2023-11-03 MED ORDER — POTASSIUM CHLORIDE CRYS ER 20 MEQ PO TBCR
40.0000 meq | EXTENDED_RELEASE_TABLET | Freq: Every day | ORAL | 3 refills | Status: AC
  Filled 2023-11-03 – 2023-11-23 (×2): qty 60, 30d supply, fill #0

## 2023-11-03 MED ORDER — SPIRONOLACTONE 25 MG PO TABS
12.5000 mg | ORAL_TABLET | Freq: Every day | ORAL | 3 refills | Status: AC
  Filled 2023-11-03: qty 45, 90d supply, fill #0

## 2023-11-03 MED ORDER — DIGOXIN 125 MCG PO TABS
0.1250 mg | ORAL_TABLET | Freq: Every day | ORAL | 3 refills | Status: AC
  Filled 2023-11-03 – 2023-11-23 (×2): qty 30, 30d supply, fill #0

## 2023-11-03 MED ORDER — TORSEMIDE 20 MG PO TABS
60.0000 mg | ORAL_TABLET | Freq: Every day | ORAL | 3 refills | Status: AC
  Filled 2023-11-03 – 2023-11-23 (×2): qty 90, 30d supply, fill #0

## 2023-11-03 MED ORDER — FUROSCIX 80 MG/10ML ~~LOC~~ CTKT: 80.0000 mg | CARTRIDGE | SUBCUTANEOUS | Status: AC

## 2023-11-03 MED ORDER — FUROSEMIDE 40 MG PO TABS
40.0000 mg | ORAL_TABLET | Freq: Two times a day (BID) | ORAL | 3 refills | Status: DC
  Filled 2023-11-03: qty 180, 90d supply, fill #0

## 2023-11-03 NOTE — Progress Notes (Signed)
 Medication Samples have been provided to the patient.  Drug name: Furoscix        Strength: 80mg         Qty: 2  LOT: [id number]  Exp.Date: 10/26/24  Dosing instructions: Use 1 kit daily for 2 days  The patient has been instructed regarding the correct time, dose, and frequency of taking this medication, including desired effects and most common side effects.   Alfred Harrel 12:52 PM 11/03/2023

## 2023-11-03 NOTE — Progress Notes (Signed)
  Subjective:  Patient ID: Michael Barnett, male    DOB: [birth year],   MRN: [id number]  Chief Complaint  Patient presents with   Callouses   Nail Problem    Pt presents for rfc and callouses.    53 y.o. male presents for  follow-up of pre-ulcerative calluses.  Relates continuing to do well. No changes  Has undergone angiogram and fem pop  bypass and doing well.  PCP:  Lawrance Presume, MD    . Denies any other pedal complaints. Denies n/v/f/c.   Past Medical History:  Diagnosis Date   AICD (automatic cardioverter/defibrillator) present    Arthritis    CHF (congestive heart failure) (HCC)    Coronary artery disease    Defibrillator activation    Diabetes mellitus    Hypertension    Myocardial infarction [facility])    Sleep apnea    Stroke (HCC)     Objective:  Physical Exam: Vascular: DP/PT pulses non palpable bilateral. CFT <3 seconds. Absent hair growth on digits. Edema noted to bilateral lower extremities. Xerosis noted bilaterally.  Skin. No lacerations or abrasions bilateral feet. Nails 1-5 bilateral  are thickened discolored and elongated with subungual debris. Hyperkeratotic lesions noted sub fourth metatarsal on left. Dorsal medial left hallux with hyperkeratosis and no underlying ulceration noted.  Musculoskeletal: MMT 5/5 bilateral lower extremities in DF, PF, Inversion and Eversion. Deceased ROM in DF of ankle joint.  Neurological: Sensation intact to light touch. Protective sensation diminished bilateral.    Assessment:   1. Pre-ulcerative calluses   2. PAD (peripheral artery disease) (HCC)   3. Type 2 diabetes mellitus with peripheral neuropathy (HCC)   4. Pain due to onychomycosis of toenails of both feet          Plan:  Patient was evaluated and treated and all questions answered. -Discussed and educated patient on diabetic foot care, especially with  regards to the vascular, neurological and musculoskeletal systems.  -Stressed the importance of good  glycemic control and the detriment of not  controlling glucose levels in relation to the foot. -Discussed supportive shoes at all times and checking feet regularly.  -Mechanically debrided all nails 1-5 bilateral using sterile nail nipper and filed with dremel without incident  -Hyperkeratotic tissue sub first and fourth metatarsal debrided with chisel without inicdent x 2.  -Answered all patient questions -Patient to return  in 3 months for at risk foot care -Patient advised to call the office if any problems or questions arise in the meantime.    No follow-ups on file.   Jennefer Moats, DPM

## 2023-11-03 NOTE — Patient Instructions (Signed)
 Medication Instructions:  Your physician has recommended you make the following change in your medication:  STOP ASPIRIN   DECREASE SPIRONOLACTONE  TO 12.5 MG DAILY. INCREASE LASIX  TO 40 MG TWICE DAILY.  *If you need a refill on your cardiac medications before your next appointment, please call your pharmacy*  Lab Work: TODAY: LP(a), LFTS, LIPIDS  If you have labs (blood work) drawn today and your tests are completely normal, you will receive your results only by: MyChart Message (if you have MyChart) OR A paper copy in the mail If you have any lab test that is abnormal or we need to change your treatment, we will call you to review the results.  Testing/Procedures: NONE  Follow-Up: At [facility], you and your health needs are our priority.  As part of our continuing mission to provide you with exceptional heart care, our providers are all part of one team.  This team includes your primary Cardiologist (physician) and Advanced Practice Providers or APPs (Physician Assistants and Nurse Practitioners) who all work together to provide you with the care you need, when you need it.  Your next appointment:   6 month(s)  Provider:   One of our Advanced Practice Providers (APPs): Melita Springer, PA-C  Friddie Jetty, NP Evaline Hill, NP  Theotis Flake, PA-C Lawana Pray, NP  Willis Harter, PA-C Lovette Rud, PA-C  Cullen, PA-C Ernest Dick, NP  Marlana Silvan, NP Marcie Sever, PA-C  Laquita Plant, PA-C    Dayna Dunn, PA-C  Scott Weaver, PA-C Palmer Bobo, NP Katlyn West, NP Callie Goodrich, PA-C  Evan Williams, PA-C Sheng Haley, PA-C  Xika Zhao, NP Kathleen Johnson, PA-C  YOU BEEN REFERRED TO SEE A PHARM-D    We recommend signing up for the patient portal called "MyChart".  Sign up information is provided on this After Visit Summary.  MyChart is used to connect with patients for Virtual Visits (Telemedicine).  Patients are able to view lab/test results, encounter notes,  upcoming appointments, etc.  Non-urgent messages can be sent to your provider as well.   To learn more about what you can do with MyChart, go to [email or web address].   Other Instructions

## 2023-11-03 NOTE — Patient Instructions (Signed)
 Medication Changes:  STOP Metoprolol  XL  STOP Furosemide   START Digoxin 0.125 mg Daily  INCREASE Potassium to 40 meq (2 tabs) Daily  Your provider has order Furoscix  for you. This is an on-body infuser that gives you a dose of Furosemide .   It will be shipped to your home   [employer]uro[employer] will call you to discuss before shipping so, PLEASE answer unknown calls  For questions regarding the device call [employer] at [phone]  Ensure you write down the time you start your infusion so that if there is a problem you will know how long the infusion lasted  Use Furoscix  only AS DIRECTED by our office  Dosing Directions:   Day 1= TODAY Wed 5/7 Use 1 Furoscix  Kit  Day 2= Tomorrow Thur 5/8 Use 1 Furoscix  Kit  Day 3= Friday 5/9 START Torsemide  60 mg (3 tabs) Daily   Lab Work:  Labs done today, your results will be available in MyChart, we will contact you for abnormal readings.   Testing/Procedures:  Your physician has recommended that you have a cardiopulmonary stress test (CPX). CPX testing is a non-invasive measurement of heart and lung function. It replaces a traditional treadmill stress test. This type of test provides a tremendous amount of information that relates not only to your present condition but also for future outcomes. This test combines measurements of you ventilation, respiratory gas exchange in the lungs, electrocardiogram (EKG), blood pressure and physical response before, during, and following an exercise protocol.   Special Instructions // Education:  Do the following things EVERYDAY: Weigh yourself in the morning before breakfast. Write it down and keep it in a log. Take your medicines as prescribed Eat low salt foods--Limit salt (sodium) to 2000 mg per day.  Stay as active as you can everyday Limit all fluids for the day to less than 2 liters   Follow-Up in: 1 week   At the [facility], you and your health needs are our  priority. We have a designated team specialized in the treatment of Heart Failure. This Care Team includes your primary Heart Failure Specialized Cardiologist (physician), Advanced Practice Providers (APPs- Physician Assistants and Nurse Practitioners), and Pharmacist who all work together to provide you with the care you need, when you need it.   You may see any of the following providers on your designated Care Team at your next follow up:  Dr. Jules Oar Dr. Peder Bourdon Dr. Alwin Baars Dr. Judyth Nunnery Nieves Bars, NP Ruddy Corral, [place] [facility] [place], [place] Dennise Fitz, NP [place] Lee, NP Luster Salters, PharmD   Please be sure to bring in all your medications bottles to every appointment.   Need to Contact Us :  If you have any questions or concerns before your next appointment please send us  a message through [place] or call our office at [phone].    TO LEAVE A MESSAGE FOR THE NURSE SELECT OPTION 2, PLEASE LEAVE A MESSAGE INCLUDING: YOUR NAME DATE OF BIRTH CALL BACK NUMBER REASON FOR CALL**this is important as we prioritize the call backs  YOU WILL RECEIVE A CALL BACK THE SAME DAY AS LONG AS YOU CALL BEFORE 4:00 PM

## 2023-11-03 NOTE — Progress Notes (Signed)
 ADVANCED HF CLINIC CONSULT NOTE     Referring Physician: Dr. Trilby Fujisawa, Rexine Cater, MD   Chief Complaint: chronic systolic CHF  HPI: Referred to clinic by Dr. Hilton Lucky with [facility] for heart failure consultation.   Emmett Schurtz is a 53 y.o. male history of chronic systolic CHF/ICM s/p ICD, CAD s/p CABG (LIMA to LAD, SVG to OM) 2013 and PCI to RCA in 2016, VT s/p VT ablation in 05/24 on Sotalol , PAF, PAD s/p left fem-pop, OSA, CVA, poorly controlled DM II, recurrent dizziness/presyncope felt to be noncardiac. Has history of chronic atypical chest pain on review of outside records.  Previously followed by Cardiology at [facility]. Had been followed by Advanced Heart Failure at OSH in past. Underwent evaluation for transplant but later declined d/t noncompliance. Established with Dr. Lorie Rook 12/24.   EF previously 25% 09/22. EF improved to 40-45% on echo 05/24.   R/LHC 12/24: occluded SVG to OM, patent LIMA to LAD, LIMA backfills the p LAD and its branches, also supplies collaterals to LCX, occluded p Cx, severe RCA disease, elevated filling pressures with preserved Fick CO. Echo 12/24: EF 45-50%, grade III DD, RV okay  Admitted 01/25 following ICD shock. On device interrogation rhythm appeared to be Afib with RVR >> inappropriate shock.  Admitted 09/28/23 with acute on chronic CHF and chest pain.  Echo with EF 30-35%, RWMA, grade III DD. Cardiology consulted. He was diuresed and GDMT titrated.   Here with his fiance.On disability due to vision impairment. Lives with his fiance. Feels "fine". Says he is walking a lot but limited by hip pain. 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 . + LE edema. Has been taking lasix  40 daily and metolazone  daily. No orthopnea or PND. Saw Dr. Lorie Rook earlier today and lasix  increase 40 daily to 40 bid. BP low. Spiro cut back to 12.5 daily  Smoking 1/2 ppd.   ICD interrogation today: Several runs NSVT over past week - longest  29sec. No AF. Volume ok. Personally reviewed    Past Medical History:  Diagnosis Date   AICD (automatic cardioverter/defibrillator) present    Arthritis    CHF (congestive heart failure) (HCC)    Coronary artery disease    Defibrillator activation    Diabetes mellitus    Hypertension    Myocardial infarction [facility])    Sleep apnea    Stroke [facility])     Current Outpatient Medications  Medication Sig Dispense Refill   albuterol  (PROVENTIL ) (2.5 MG/3ML) 0.083% nebulizer solution Take 3 mLs (2.5 mg total) by nebulization every 6 (six) hours as needed for wheezing or shortness of breath. 150 mL 1   albuterol  (VENTOLIN  HFA) 108 (90 Base) MCG/ACT inhaler Inhale 1-2 puffs into the lungs every 6 (six) hours as needed for wheezing or shortness of breath. 1 each 0   apixaban  (ELIQUIS ) 5 MG TABS tablet Take 1 tablet (5 mg total) by mouth 2 (two) times daily. 180 tablet 3   atorvastatin  (LIPITOR ) 80 MG tablet Take 1 tablet (80 mg total) by mouth daily. 90 tablet 3   Blood Pressure KIT Use as needed. 1 kit 0   budesonide -formoterol  (SYMBICORT ) 80-4.5 MCG/ACT inhaler Inhale 2 puffs into the lungs 2 (two) times daily. 1 each 5   Continuous Glucose Receiver (FREESTYLE LIBRE 3 READER) DEVI UAD to monitor blood sugar 1 each 0   Continuous Glucose Sensor (FREESTYLE LIBRE 3 PLUS SENSOR) MISC Change sensor every 15 days. 2  each 6   empagliflozin  (JARDIANCE ) 25 MG TABS tablet Take 1 tablet (25 mg total) by mouth daily. 90 tablet 2   Fingerstix Lancets MISC Use as directed with testing supplies up to 4 times daily. 100 each 0   furosemide  (LASIX ) 40 MG tablet Take 1 tablet (40 mg total) by mouth 2 (two) times daily. 180 tablet 3   glucose blood test strip Use as instructed 100 each 12   insulin  glargine (LANTUS ) 100 UNIT/ML injection Inject 18 Units total into the skin 2 (two) times daily. 10 mL 5   Lancet Devices (TRUEDRAW LANCING DEVICE) MISC Use as directed up to four times daily 1 each 0   metolazone   (ZAROXOLYN ) 2.5 MG tablet Take 1 tablet (2.5 mg total) by mouth daily as needed (increasing leg swelling, shortness of breath or weight gain > 3lbs overnight). 30 tablet 0   metoprolol  succinate (TOPROL -XL) 25 MG 24 hr tablet Take 1 tablet (25 mg total) by mouth daily. 90 tablet 0   nitroGLYCERIN  (NITROSTAT ) 0.4 MG SL tablet Place 0.4 mg under the tongue every 5 (five) minutes as needed for chest pain.     oxyCODONE -acetaminophen  (PERCOCET) 10-325 MG tablet Take 1 tablet by mouth every 4 (four) hours as needed for pain.     pantoprazole  (PROTONIX ) 40 MG tablet Take 1 tablet (40 mg total) by mouth daily. 30 tablet 3   potassium chloride  SA (KLOR-CON  M) 20 MEQ tablet Take 1 tablet (20 mEq total) by mouth daily. 90 tablet 1   sacubitril -valsartan  (ENTRESTO ) 24-26 MG Take 1 tablet by mouth 2 (two) times daily. 60 tablet 3   sotalol  (BETAPACE ) 160 MG tablet Take 1 tablet (160 mg total) by mouth 2 (two) times daily. 180 tablet 3   spironolactone  (ALDACTONE ) 25 MG tablet Take 0.5 tablets (12.5 mg total) by mouth daily. 45 tablet 3   No current facility-administered medications for this encounter.    Allergies  Allergen Reactions   Victoza [Liraglutide] Other (See Comments)    Gave pt pancreatitis      Social History   Socioeconomic History   Marital status: Single    Spouse name: Not on file   Number of children: 3   Years of education: Not on file   Highest education level: Associate degree: academic program  Occupational History   Occupation: Disabilty  Tobacco Use   Smoking status: Former    Current packs/day: 0.00    Average packs/day: 0.5 packs/day for 35.0 years (17.5 ttl pk-yrs)    Types: Cigarettes    Start date: 14    Quit date: 06/30/2023    Years since quitting: 0.3   Smokeless tobacco: Not on file  Vaping Use   Vaping status: Never Used  Substance and Sexual Activity   Alcohol use: No   Drug use: Never   Sexual activity: Yes  Other Topics Concern   Not on file   Social History Narrative   Not on file   Social Drivers of Health   Financial Resource Strain: Low Risk  (09/30/2023)   Overall Financial Resource Strain (CARDIA)    Difficulty of Paying Living Expenses: Not very hard  Food Insecurity: No Food Insecurity (10/05/2023)   Hunger Vital Sign    Worried About Running Out of Food in the Last Year: Never true    Ran Out of Food in the Last Year: Never true  Transportation Needs: No Transportation Needs (10/05/2023)   PRAPARE - Administrator, Civil Service (Medical):  No    Lack of Transportation (Non-Medical): No  Physical Activity: Not on file  Stress: Not on file  Social Connections: Not on file  Intimate Partner Violence: Not At Risk (10/05/2023)   Humiliation, Afraid, Rape, and Kick questionnaire    Fear of Current or Ex-Partner: No    Emotionally Abused: No    Physically Abused: No    Sexually Abused: No      Family History  Problem Relation Age of Onset   Heart attack Mother    Heart attack Father    Heart attack Maternal Uncle     Vitals:   11/03/23 1145  BP: (!) 90/50  Pulse: 62  SpO2: 97%  Weight: 96.6 kg (213 lb)   Wt Readings from Last 3 Encounters:  11/03/23 96.6 kg (213 lb)  11/03/23 96.3 kg (212 lb 6.4 oz)  10/11/23 98 kg (216 lb)    PHYSICAL EXAM: General:  Sitting on exam table No resp difficulty HEENT: normal Neck: supple. JVP 8 Carotids 2+ bilat; no bruits. No lymphadenopathy or thryomegaly appreciated. Cor: PMI nondisplaced. Regular rate & rhythm. No rubs, gallops or murmurs. Lungs: clear Abdomen: soft, nontender, nondistended. No hepatosplenomegaly. No bruits or masses. Good bowel sounds. Extremities: no cyanosis, clubbing, rash, 2+ ankle edema cool  Neuro: alert & orientedx3, cranial nerves grossly intact. moves all 4 extremities w/o difficulty. Affect pleasant  ReDS: 42%  ECG: SR 63 No ST-T wave abnormalities.  QTc 446 Personally reviewed   ASSESSMENT & PLAN:  1. Chronic systolic  CHFdue to iCM -s/p CABG in 2013 severe 3v CAD.  -R/LHC 12/24: LIMA to LAD patent. SVG-OM occluded. LCx fills from L-L collats -> med RX -RHC 12/24 RA 8 PA 57/22 (37) PCW 19 Fick 5.9/2.7 -EF previously as low as 25% in 2022 -Echo 12/24: EF 45-50%, grade III DD, RV okay -Echo 03/25: EF 30-35% - Recent admit 4/25 for volume overload -NYHA II-III -Volume overloaded on exam - Switch lasix  to torsemide  60 daily. Will give Fuoscix x 2 days -Continue Jardiance  25 mg daily  -Continue spiro 12.5 mg daily -Continue toprol  xl 25 mg daily -Continue entresto  24/26 mg BID - Add digoxin  0.125 daily -BP too low to titrate -Hemodynamics ok on RHC in 12/24. Functional capacity relative NYHA II-III but is cool on exam and volume overloaded with SBP 90. Will plan CPX testing this summer (when available) to better risk stratify for advanced therapies. Likely not transplant candidate with PAD but can consider VAD  2. CAD -CABG X 2 in 2013 with SVG to OM and LIMA to LAD -PCI to RCA in 2016 -LHC 12/24 occluded SVG to OM, patent LIMA to LAD, LIMA backfills the p LAD and its branches, also supplies collaterals to LCX, occluded p Cx, severe RCA disease. No substrate for revascularization.  -Chronic atypical CP -On asa and high-intensity statin -Managed by Dr. Lorie Rook   3. PAF -Remains in NSR - Continue Eliquis   4. VT/PVCs -S/p VT ablation in 05/[facility] -Continue sotalol  -Followed by EP -ICD in place. ICD interrogated today. Several brief runs NSVT. No therapies  5. PAD -S/p left fempop in 2024 -Has been following with podiatry for pre-ulcerative changes left foot.  -Complaining of R leg/hip pain. R ABI 0.6 in 01/25. ? Symptoms consistent with claudication. -Encourage to f/u with [facility]  6. OSA -CPAP machine broke -Will refer to pulmonary  Jules Oar, MD  4:10 PM

## 2023-11-03 NOTE — Progress Notes (Signed)
 Provided patient education on Furoscix using demo kits and Furoscix video, QR code provided on AVS for further viewing. Furoscix order submitted online, ov note and ins card uploaded to [employer].

## 2023-11-04 LAB — LIPOPROTEIN A (LPA): Lipoprotein (a): 261.4 nmol/L — ABNORMAL HIGH (ref <75.0)

## 2023-11-05 ENCOUNTER — Other Ambulatory Visit (HOSPITAL_COMMUNITY): Payer: Self-pay

## 2023-11-08 ENCOUNTER — Telehealth (HOSPITAL_COMMUNITY): Payer: Self-pay

## 2023-11-08 NOTE — Telephone Encounter (Signed)
 Called to confirm/remind patient of their appointment at the [facility] on 11/09/2023 9:00.   Appointment:   [] Confirmed  [x] Left mess   [] No answer/No voice mail  [] VM Full/unable to leave message  [] Phone not in service  Patient reminded to bring all medications and/or complete list.  Confirmed patient has transportation. Gave directions, instructed to utilize valet parking.

## 2023-11-08 NOTE — Progress Notes (Signed)
 ADVANCED HF CLINIC NOTE    Referring Physician: Dr. Trilby Fujisawa, Rexine Cater, MD   Chief Complaint: chronic systolic CHF  HPI: Michael Barnett is a 53 y.o. male history of chronic systolic CHF/ICM s/p ICD, CAD s/p CABG (LIMA to LAD, SVG to OM) 2013 and PCI to RCA in 2016, VT s/p VT ablation in 05/24 on Sotalol , PAF, PAD s/p left fem-pop, OSA, CVA, poorly controlled DM II, recurrent dizziness/presyncope felt to be noncardiac. Has history of chronic atypical chest pain on review of outside records.  Previously followed by Cardiology at [facility]. Had been followed by Advanced Heart Failure at OSH in past. Underwent evaluation for transplant but later declined d/t noncompliance. Established with Dr. Lorie Rook 12/24.   EF previously 25% 09/22. EF improved to 40-45% on echo 05/24.   R/LHC 12/24: occluded SVG to OM, patent LIMA to LAD, LIMA backfills the p LAD and its branches, also supplies collaterals to LCX, occluded p Cx, severe RCA disease, elevated filling pressures with preserved Fick CO. Echo 12/24: EF 45-50%, grade III DD, RV okay  Admitted 01/25 following ICD shock. On device interrogation rhythm appeared to be Afib with RVR >> inappropriate shock.  Admitted 09/28/23 with acute on chronic CHF and chest pain.  Echo with EF 30-35%, RWMA, grade III DD. Cardiology consulted. He was diuresed and GDMT titrated.   Today he returns for AHF follow up. Overall feeling ***. Denies palpitations, CP, dizziness, edema, or PND/Orthopnea. *** SOB. Appetite ok. No fever or chills. Weight at home *** pounds. Taking all medications. Denies ETOH, tobacco or drug use. Smoking 1/2 ppd.   BS ICD interrogation today: Several runs NSVT over past week - longest 29sec. No AF. Volume ok. Personally reviewed ***   Past Medical History:  Diagnosis Date   AICD (automatic cardioverter/defibrillator) present    Arthritis    CHF (congestive heart failure) (HCC)    Coronary artery disease    Defibrillator activation     Diabetes mellitus    Hypertension    Myocardial infarction [facility])    Sleep apnea    Stroke [facility])     Current Outpatient Medications  Medication Sig Dispense Refill   albuterol  (PROVENTIL ) (2.5 MG/3ML) 0.083% nebulizer solution Take 3 mLs (2.5 mg total) by nebulization every 6 (six) hours as needed for wheezing or shortness of breath. 150 mL 1   albuterol  (VENTOLIN  HFA) 108 (90 Base) MCG/ACT inhaler Inhale 1-2 puffs into the lungs every 6 (six) hours as needed for wheezing or shortness of breath. 1 each 0   apixaban  (ELIQUIS ) 5 MG TABS tablet Take 1 tablet (5 mg total) by mouth 2 (two) times daily. 180 tablet 3   atorvastatin  (LIPITOR ) 80 MG tablet Take 1 tablet (80 mg total) by mouth daily. 90 tablet 3   Blood Pressure KIT Use as needed. 1 kit 0   budesonide -formoterol  (SYMBICORT ) 80-4.5 MCG/ACT inhaler Inhale 2 puffs into the lungs 2 (two) times daily. 1 each 5   Continuous Glucose Receiver (FREESTYLE LIBRE 3 READER) DEVI UAD to monitor blood sugar 1 each 0   Continuous Glucose Sensor (FREESTYLE LIBRE 3 PLUS SENSOR) MISC Change sensor every 15 days. 2 each 6   digoxin  (LANOXIN ) 0.125 MG tablet Take 1 tablet (0.125 mg total) by mouth daily. 30 tablet 3   empagliflozin  (JARDIANCE ) 25 MG TABS tablet Take 1 tablet (25 mg total) by mouth daily. 90 tablet 2   Fingerstix Lancets MISC Use as directed with testing supplies up to 4 times daily.  100 each 0   Furosemide  (FUROSCIX ) 80 MG/10ML CTKT Inject 80 mg into the skin as directed.     glucose blood test strip Use as instructed 100 each 12   insulin  glargine (LANTUS ) 100 UNIT/ML injection Inject 18 Units total into the skin 2 (two) times daily. 10 mL 5   Lancet Devices (TRUEDRAW LANCING DEVICE) MISC Use as directed up to four times daily 1 each 0   metolazone  (ZAROXOLYN ) 2.5 MG tablet Take 1 tablet (2.5 mg total) by mouth daily as needed (increasing leg swelling, shortness of breath or weight gain > 3lbs overnight). 30 tablet 0   nitroGLYCERIN   (NITROSTAT ) 0.4 MG SL tablet Place 0.4 mg under the tongue every 5 (five) minutes as needed for chest pain.     oxyCODONE -acetaminophen  (PERCOCET) 10-325 MG tablet Take 1 tablet by mouth every 4 (four) hours as needed for pain.     pantoprazole  (PROTONIX ) 40 MG tablet Take 1 tablet (40 mg total) by mouth daily. 30 tablet 3   potassium chloride  SA (KLOR-CON  M) 20 MEQ tablet Take 2 tablets (40 mEq total) by mouth daily. 60 tablet 3   sacubitril -valsartan  (ENTRESTO ) 24-26 MG Take 1 tablet by mouth 2 (two) times daily. 60 tablet 3   sotalol  (BETAPACE ) 160 MG tablet Take 1 tablet (160 mg total) by mouth 2 (two) times daily. 180 tablet 3   spironolactone  (ALDACTONE ) 25 MG tablet Take 0.5 tablets (12.5 mg total) by mouth daily. 45 tablet 3   torsemide  (DEMADEX ) 20 MG tablet Take 3 tablets (60 mg total) by mouth daily. 90 tablet 3   No current facility-administered medications for this visit.    Allergies  Allergen Reactions   Victoza [Liraglutide] Other (See Comments)    Gave pt pancreatitis      Social History   Socioeconomic History   Marital status: Single    Spouse name: Not on file   Number of children: 3   Years of education: Not on file   Highest education level: Associate degree: academic program  Occupational History   Occupation: Disabilty  Tobacco Use   Smoking status: Former    Current packs/day: 0.00    Average packs/day: 0.5 packs/day for 35.0 years (17.5 ttl pk-yrs)    Types: Cigarettes    Start date: 70    Quit date: 06/30/2023    Years since quitting: 0.3   Smokeless tobacco: Not on file  Vaping Use   Vaping status: Never Used  Substance and Sexual Activity   Alcohol use: No   Drug use: Never   Sexual activity: Yes  Other Topics Concern   Not on file  Social History Narrative   Not on file   Social Drivers of Health   Financial Resource Strain: Low Risk  (09/30/2023)   Overall Financial Resource Strain (CARDIA)    Difficulty of Paying Living Expenses: Not  very hard  Food Insecurity: No Food Insecurity (10/05/2023)   Hunger Vital Sign    Worried About Running Out of Food in the Last Year: Never true    Ran Out of Food in the Last Year: Never true  Transportation Needs: No Transportation Needs (10/05/2023)   PRAPARE - Administrator, Civil Service (Medical): No    Lack of Transportation (Non-Medical): No  Physical Activity: Not on file  Stress: Not on file  Social Connections: Not on file  Intimate Partner Violence: Not At Risk (10/05/2023)   Humiliation, Afraid, Rape, and Kick questionnaire    Fear  of Current or Ex-Partner: No    Emotionally Abused: No    Physically Abused: No    Sexually Abused: No      Family History  Problem Relation Age of Onset   Heart attack Mother    Heart attack Father    Heart attack Maternal Uncle     There were no vitals filed for this visit.  Wt Readings from Last 3 Encounters:  11/03/23 96.6 kg (213 lb)  11/03/23 96.3 kg (212 lb 6.4 oz)  10/11/23 98 kg (216 lb)    PHYSICAL EXAM: General:  *** appearing.  No respiratory difficulty HEENT: normal Neck: supple. JVD *** cm.  Cor: PMI nondisplaced. Regular rate & rhythm. No rubs, gallops or murmurs. Lungs: clear Abdomen: soft, nontender, nondistended. Good bowel sounds. Extremities: no cyanosis, clubbing, rash, edema  Neuro: alert & oriented x 3. Moves all 4 extremities w/o difficulty. Affect pleasant.   ReDS: 42% ***  ECG: SR 63 No ST-T wave abnormalities.  QTc 446 Personally reviewed from 11/03/23   ASSESSMENT & PLAN:  1. Chronic systolic CHF due to iCM -s/p CABG in 2013 severe 3v CAD.  -R/LHC 12/24: LIMA to LAD patent. SVG-OM occluded. LCx fills from L-L collats -> med RX -RHC 12/24 RA 8 PA 57/22 (37) PCW 19 Fick 5.9/2.7 -EF previously as low as 25% in 2022 -Echo 12/24: EF 45-50%, grade III DD, RV okay -Echo 03/25: EF 30-35% -Recent admit 4/25 for volume overload -NYHA II-III -Volume overloaded on exam -Switch lasix  to  torsemide  60 daily. Will give Fuoscix x 2 days *** -Continue Jardiance  25 mg daily  -Continue spiro 12.5 mg daily -Continue toprol  xl 25 mg daily -Continue entresto  24/26 mg BID - Add digoxin  0.125 daily -BP too low to titrate -Hemodynamics ok on RHC in 12/24. Functional capacity relative NYHA II-III but is cool on exam and volume overloaded with SBP 90. Will plan CPX testing this summer (when available) to better risk stratify for advanced therapies. Likely not transplant candidate with PAD but can consider VAD  2. CAD -CABG X 2 in 2013 with SVG to OM and LIMA to LAD -PCI to RCA in 2016 -LHC 12/24 occluded SVG to OM, patent LIMA to LAD, LIMA backfills the p LAD and its branches, also supplies collaterals to LCX, occluded p Cx, severe RCA disease. No substrate for revascularization.  -Chronic atypical CP -On asa and high-intensity statin -Managed by Dr. Lorie Rook   3. PAF -Remains in NSR - Continue Eliquis   4. VT/PVCs -S/p VT ablation in 05/24 at [facility] -Continue sotalol  -Followed by EP -ICD in place. ICD interrogated today. Several brief runs NSVT. No therapies  5. PAD -S/p left fempop in 2024 -Has been following with podiatry for pre-ulcerative changes left foot.  -Complaining of R leg/hip pain. R ABI 0.6 in 01/25. ? Symptoms consistent with claudication. -Encourage to f/u with [facility]  6. OSA -CPAP machine broke -Will refer to pulmonary  Follow up ***  Sheryl Donna, NP  12:47 PM

## 2023-11-09 ENCOUNTER — Ambulatory Visit (HOSPITAL_COMMUNITY): Payer: Self-pay | Admitting: Internal Medicine

## 2023-11-09 ENCOUNTER — Encounter: Payer: Self-pay | Admitting: Internal Medicine

## 2023-11-09 ENCOUNTER — Ambulatory Visit (HOSPITAL_COMMUNITY)
Admission: RE | Admit: 2023-11-09 | Discharge: 2023-11-09 | Disposition: A | Discharge status: Home / Self Care | Source: Ambulatory Visit | Attending: Internal Medicine | Admitting: Internal Medicine

## 2023-11-09 ENCOUNTER — Ambulatory Visit: Payer: Medicaid Other | Admitting: Internal Medicine

## 2023-11-09 ENCOUNTER — Encounter (HOSPITAL_COMMUNITY): Payer: Self-pay

## 2023-11-09 VITALS — BP 98/61 | HR 68 | Resp 19 | Ht 69.0 in | Wt 211.0 lb

## 2023-11-09 VITALS — BP 106/68 | HR 62 | Wt 211.6 lb

## 2023-11-09 DIAGNOSIS — F17210 Nicotine dependence, cigarettes, uncomplicated: Secondary | ICD-10-CM | POA: Insufficient documentation

## 2023-11-09 DIAGNOSIS — M79604 Pain in right leg: Secondary | ICD-10-CM | POA: Insufficient documentation

## 2023-11-09 DIAGNOSIS — I480 Paroxysmal atrial fibrillation: Secondary | ICD-10-CM

## 2023-11-09 DIAGNOSIS — I493 Ventricular premature depolarization: Secondary | ICD-10-CM | POA: Insufficient documentation

## 2023-11-09 DIAGNOSIS — I5022 Chronic systolic (congestive) heart failure: Secondary | ICD-10-CM | POA: Diagnosis present

## 2023-11-09 DIAGNOSIS — G4733 Obstructive sleep apnea (adult) (pediatric): Secondary | ICD-10-CM

## 2023-11-09 DIAGNOSIS — M25559 Pain in unspecified hip: Secondary | ICD-10-CM | POA: Insufficient documentation

## 2023-11-09 DIAGNOSIS — Z8673 Personal history of transient ischemic attack (TIA), and cerebral infarction without residual deficits: Secondary | ICD-10-CM | POA: Insufficient documentation

## 2023-11-09 DIAGNOSIS — I472 Ventricular tachycardia, unspecified: Secondary | ICD-10-CM | POA: Diagnosis not present

## 2023-11-09 DIAGNOSIS — I25810 Atherosclerosis of coronary artery bypass graft(s) without angina pectoris: Secondary | ICD-10-CM | POA: Diagnosis not present

## 2023-11-09 DIAGNOSIS — E11621 Type 2 diabetes mellitus with foot ulcer: Secondary | ICD-10-CM | POA: Diagnosis not present

## 2023-11-09 DIAGNOSIS — Z91148 Patient's other noncompliance with medication regimen for other reason: Secondary | ICD-10-CM | POA: Insufficient documentation

## 2023-11-09 DIAGNOSIS — Z7982 Long term (current) use of aspirin: Secondary | ICD-10-CM | POA: Diagnosis not present

## 2023-11-09 DIAGNOSIS — I739 Peripheral vascular disease, unspecified: Secondary | ICD-10-CM

## 2023-11-09 DIAGNOSIS — I2510 Atherosclerotic heart disease of native coronary artery without angina pectoris: Secondary | ICD-10-CM | POA: Diagnosis not present

## 2023-11-09 DIAGNOSIS — Z79899 Other long term (current) drug therapy: Secondary | ICD-10-CM | POA: Diagnosis not present

## 2023-11-09 DIAGNOSIS — Z955 Presence of coronary angioplasty implant and graft: Secondary | ICD-10-CM | POA: Insufficient documentation

## 2023-11-09 DIAGNOSIS — Z794 Long term (current) use of insulin: Secondary | ICD-10-CM | POA: Diagnosis not present

## 2023-11-09 DIAGNOSIS — Z5321 Procedure and treatment not carried out due to patient leaving prior to being seen by health care provider: Secondary | ICD-10-CM

## 2023-11-09 DIAGNOSIS — Z7901 Long term (current) use of anticoagulants: Secondary | ICD-10-CM | POA: Diagnosis not present

## 2023-11-09 DIAGNOSIS — Z95810 Presence of automatic (implantable) cardiac defibrillator: Secondary | ICD-10-CM | POA: Diagnosis not present

## 2023-11-09 DIAGNOSIS — Z951 Presence of aortocoronary bypass graft: Secondary | ICD-10-CM | POA: Diagnosis not present

## 2023-11-09 DIAGNOSIS — I110 Hypertensive heart disease with heart failure: Secondary | ICD-10-CM | POA: Insufficient documentation

## 2023-11-09 DIAGNOSIS — Z7984 Long term (current) use of oral hypoglycemic drugs: Secondary | ICD-10-CM | POA: Diagnosis not present

## 2023-11-09 LAB — BASIC METABOLIC PANEL WITH GFR
Anion gap: 6 (ref 5–15)
BUN: 28 mg/dL — ABNORMAL HIGH (ref 6–20)
CO2: 25 mmol/L (ref 22–32)
Calcium: 8.8 mg/dL — ABNORMAL LOW (ref 8.9–10.3)
Chloride: 104 mmol/L (ref 98–111)
Creatinine, Ser: 1.54 mg/dL — ABNORMAL HIGH (ref 0.61–1.24)
GFR, Estimated: 54 mL/min — ABNORMAL LOW (ref >60)
Glucose, Bld: 267 mg/dL — ABNORMAL HIGH (ref 70–99)
Potassium: 5.1 mmol/L (ref 3.5–5.1)
Sodium: 135 mmol/L (ref 135–145)

## 2023-11-09 LAB — CBC
HCT: 50.7 % (ref 39.0–52.0)
Hemoglobin: 16.6 g/dL (ref 13.0–17.0)
MCH: 30.2 pg (ref 26.0–34.0)
MCHC: 32.7 g/dL (ref 30.0–36.0)
MCV: 92.2 fL (ref 80.0–100.0)
Platelets: 117 10*3/uL — ABNORMAL LOW (ref 150–400)
RBC: 5.5 MIL/uL (ref 4.22–5.81)
RDW: 13.8 % (ref 11.5–15.5)
WBC: 5.7 10*3/uL (ref 4.0–10.5)
nRBC: 0 % (ref 0.0–0.2)

## 2023-11-09 LAB — BRAIN NATRIURETIC PEPTIDE: B Natriuretic Peptide: 662.8 pg/mL — ABNORMAL HIGH (ref 0.0–100.0)

## 2023-11-09 NOTE — Patient Instructions (Signed)
 PLEASE START your Digoxin  125 mcg daily and Torsemide  60 mg daily.  TAKE 1 METOLAZONE  TABLET TODAY WITH AN EXTRA 2 POTASSIUM TABLETS.( TOTAL OF 4 POTASSIUM TABLETS TODAY)  PLEASE take 2 potassium tablets every day.  Labs done today, your results will be available in MyChart, we will contact you for abnormal readings.  REPEAT blood work in 1 week.  You have been referred to the Pulmonologist. They will call you to arrange your appointment.   Your physician recommends that you schedule a follow-up appointment as scheduled.  If you have any questions or concerns before your next appointment please send us  a message through [facility] or call our office at [phone].    TO LEAVE A MESSAGE FOR THE NURSE SELECT OPTION 2, PLEASE LEAVE A MESSAGE INCLUDING: YOUR NAME DATE OF BIRTH CALL BACK NUMBER REASON FOR CALL**this is important as we prioritize the call backs  YOU WILL RECEIVE A CALL BACK THE SAME DAY AS LONG AS YOU CALL BEFORE 4:00 PM  At the [facility], you and your health needs are our priority. As part of our continuing mission to provide you with exceptional heart care, we have created designated Provider Care Teams. These Care Teams include your primary Cardiologist (physician) and Advanced Practice Providers (APPs- Physician Assistants and Nurse Practitioners) who all work together to provide you with the care you need, when you need it.   You may see any of the following providers on your designated Care Team at your next follow up: Dr Jules Oar Dr Peder Bourdon Dr. Alwin Baars Dr. Arta Lark Amy Marijane Shoulders, NP Ruddy Corral, [place] [facility] [place], [place] Dennise Fitz, NP [place] Lee, NP Shawnee Dellen, NP Luster Salters, PharmD Bevely Brush, PharmD   Please be sure to bring in all your medications bottles to every appointment.    Thank you for choosing [facility]

## 2023-11-09 NOTE — Progress Notes (Signed)
 Pt left before being seen. I had actually called him early this a.m before 8:30 and LVMM letting him know that he did not need to keep appt today as I had just due his chronic disease management visit on the 14th of last month with plans to see him back in 4 months.  I think this was an old appointment in the system that had not been canceled out.  He apparently did not get my message. I tried calling him today again at 12:30 but did not leave a message.  I will have the staff reach out to him to have him schedule follow-up in 4 months.

## 2023-11-10 ENCOUNTER — Other Ambulatory Visit (HOSPITAL_COMMUNITY): Payer: Self-pay

## 2023-11-10 ENCOUNTER — Telehealth: Payer: Self-pay | Admitting: Internal Medicine

## 2023-11-10 ENCOUNTER — Ambulatory Visit: Payer: Self-pay | Admitting: *Deleted

## 2023-11-10 DIAGNOSIS — E785 Hyperlipidemia, unspecified: Secondary | ICD-10-CM

## 2023-11-10 MED ORDER — EZETIMIBE 10 MG PO TABS
10.0000 mg | ORAL_TABLET | Freq: Every day | ORAL | 3 refills | Status: AC
  Filled 2023-11-10 – 2023-11-23 (×2): qty 90, 90d supply, fill #0

## 2023-11-10 NOTE — Telephone Encounter (Addendum)
 Called & spoke to the patient. Verified name & DOB. Appointment scheduled for 03/10/2024 at 10:10 am. Patient acknowledged and had no further questions.  ----- Message from Concetta Dee sent at 11/09/2023 12:31 PM EDT ----- Regarding: Appt Patient left before being seen today.  I had actually called him early this morning before I started clinic and left VMM to let him know that he did not have to keep this appointment today as I had seen him just last month for his chronic disease management with plan to follow-up in 4 months.  Please try to reach him and give follow-up appointment for 4 months

## 2023-11-15 ENCOUNTER — Ambulatory Visit: Payer: Medicaid Other | Admitting: Internal Medicine

## 2023-11-15 ENCOUNTER — Encounter: Payer: Self-pay | Admitting: Internal Medicine

## 2023-11-15 ENCOUNTER — Other Ambulatory Visit (HOSPITAL_COMMUNITY): Payer: Self-pay

## 2023-11-16 ENCOUNTER — Ambulatory Visit (HOSPITAL_COMMUNITY)
Admission: RE | Admit: 2023-11-16 | Discharge: 2023-11-16 | Disposition: A | Discharge status: Home / Self Care | Source: Ambulatory Visit | Attending: Cardiology | Admitting: Cardiology

## 2023-11-16 DIAGNOSIS — I5022 Chronic systolic (congestive) heart failure: Secondary | ICD-10-CM

## 2023-11-16 LAB — BASIC METABOLIC PANEL WITH GFR
Anion gap: 7 (ref 5–15)
BUN: 16 mg/dL (ref 6–20)
CO2: 23 mmol/L (ref 22–32)
Calcium: 9 mg/dL (ref 8.9–10.3)
Chloride: 105 mmol/L (ref 98–111)
Creatinine, Ser: 1.16 mg/dL (ref 0.61–1.24)
GFR, Estimated: >60 mL/min (ref >60)
Glucose, Bld: 250 mg/dL — ABNORMAL HIGH (ref 70–99)
Potassium: 4.2 mmol/L (ref 3.5–5.1)
Sodium: 135 mmol/L (ref 135–145)

## 2023-11-19 ENCOUNTER — Other Ambulatory Visit (HOSPITAL_COMMUNITY): Payer: Self-pay

## 2023-11-23 ENCOUNTER — Other Ambulatory Visit: Payer: Self-pay

## 2023-11-23 ENCOUNTER — Other Ambulatory Visit: Payer: Self-pay | Admitting: Pulmonary Disease

## 2023-11-23 ENCOUNTER — Other Ambulatory Visit (HOSPITAL_COMMUNITY): Payer: Self-pay

## 2023-11-29 ENCOUNTER — Telehealth (HOSPITAL_COMMUNITY): Payer: Self-pay

## 2023-11-29 ENCOUNTER — Other Ambulatory Visit (HOSPITAL_COMMUNITY): Payer: Self-pay

## 2023-11-29 NOTE — Telephone Encounter (Signed)
 Called to confirm/remind patient of their appointment at the [facility] on 11/30/23.   Appointment:   [] Confirmed  [x] Left mess   [] No answer/No voice mail  [] VM Full/unable to leave message  [] Phone not in service  And to bring in all medications and/or complete list.

## 2023-11-29 NOTE — Progress Notes (Signed)
 [facility] [place]  ASSESSMENT / PLAN: Michael Barnett is a 53 y.o. male status post left femoral - above knee popliteal artery bypass 06/23/24 for ulceration.  Recommend:  Abstinence from all tobacco products. Blood glucose control with goal A1c < 7%. Blood pressure control with goal blood pressure < 140/90 mmHg. Lipid reduction therapy with goal LDL-C <70   Aspirin  81mg  PO QD.  Atorvastatin  40-80mg  PO QD (or other "high intensity" statin therapy).  His left foot has healed. His bypass has thrombosed.  No role for reintervention at this time.  Continue best medical therapy for atherosclerotic disease.  I will see him again in 6 months.  CHIEF COMPLAINT: left great toe ulcer  HISTORY OF PRESENT ILLNESS: Michael Barnett is a 53 y.o. male referred to clinic for evaluation of left great toe ulcer by Dr. Alvah Auerbach.  The patient has a strong personal history of atherosclerotic disease.  He had coronary artery bypass grafting in [place]  several years ago.  He is a diabetic with poorly controlled diabetes.  He reports a left great toe injury several months ago with persistent pain.  He was noted to have a small ulcer on his left great toe by Dr. Alvah Auerbach during her evaluation about a week ago, and referred for further management.  07/27/23: doing well overall. Still has some discomfort about his left knee. Incisions have healed. Left foot is better. Walking is better. Left foot has healed.  12/27/2023.  Patient returns to clinic for follow-up.  He reports his heart function has deteriorated.  He has fairly symptomatic heart failure.  He does not have any classic symptoms of claudication.  He has no rest pain.  He has no ulcers about his feet.  We reviewed his duplex.  I counseled him that unfortunately his bypass appears thrombosed.  Past Medical History:  Diagnosis Date   AICD (automatic cardioverter/defibrillator) present    Arthritis    CHF (congestive heart failure) (HCC)     Coronary artery disease    Defibrillator activation    Diabetes mellitus    Hypertension    Myocardial infarction [facility])    Sleep apnea    Stroke [facility])     Past Surgical History:  Procedure Laterality Date   ABDOMINAL AORTOGRAM W/LOWER EXTREMITY N/A 06/04/2023   Procedure: ABDOMINAL AORTOGRAM W/LOWER EXTREMITY;  Surgeon: Carlene Che, MD;  Location: [facility] INVASIVE CV LAB;  Service: Cardiovascular;  Laterality: N/A;   CORONARY ARTERY BYPASS GRAFT     FEMORAL-POPLITEAL BYPASS GRAFT Left 06/24/2023   Procedure: BYPASS GRAFT FEMORAL- ABOVE KNEE POPLITEAL ARTERY;  Surgeon: Carlene Che, MD;  Location: [facility] OR;  Service: Vascular;  Laterality: Left;   RIGHT/LEFT HEART CATH AND CORONARY/GRAFT ANGIOGRAPHY N/A 06/15/2023   Procedure: RIGHT/LEFT HEART CATH AND CORONARY/GRAFT ANGIOGRAPHY;  Surgeon: Arnoldo Lapping, MD;  Location: [facility] INVASIVE CV LAB;  Service: Cardiovascular;  Laterality: N/A;    Family History  Problem Relation Age of Onset   Heart attack Mother    Heart attack Father    Heart attack Maternal Uncle     Social History   Socioeconomic History   Marital status: Single    Spouse name: Not on file   Number of children: 3   Years of education: Not on file   Highest education level: Associate degree: academic program  Occupational History   Occupation: Disabilty  Tobacco Use   Smoking status: Some Days    Current packs/day: 0.00    Average packs/day: 0.5 packs/day for  35.0 years (17.5 ttl pk-yrs)    Types: Cigarettes    Start date: 58    Last attempt to quit: 06/30/2023    Years since quitting: 0.4   Smokeless tobacco: Not on file  Vaping Use   Vaping status: Never Used  Substance and Sexual Activity   Alcohol use: No   Drug use: Never   Sexual activity: Yes  Other Topics Concern   Not on file  Social History Narrative   Not on file   Social Drivers of Health   Financial Resource Strain: Low Risk  (09/30/2023)   Overall Financial Resource Strain (CARDIA)     Difficulty of Paying Living Expenses: Not very hard  Food Insecurity: No Food Insecurity (10/05/2023)   Hunger Vital Sign    Worried About Running Out of Food in the Last Year: Never true    Ran Out of Food in the Last Year: Never true  Transportation Needs: No Transportation Needs (10/05/2023)   PRAPARE - Administrator, Civil Service (Medical): No    Lack of Transportation (Non-Medical): No  Physical Activity: Not on file  Stress: Not on file  Social Connections: Not on file  Intimate Partner Violence: Not At Risk (10/05/2023)   Humiliation, Afraid, Rape, and Kick questionnaire    Fear of Current or Ex-Partner: No    Emotionally Abused: No    Physically Abused: No    Sexually Abused: No    Allergies  Allergen Reactions   Victoza [Liraglutide] Other (See Comments)    Gave pt pancreatitis    Current Outpatient Medications  Medication Sig Dispense Refill   albuterol  (PROVENTIL ) (2.5 MG/3ML) 0.083% nebulizer solution Take 3 mLs (2.5 mg total) by nebulization every 6 (six) hours as needed for wheezing or shortness of breath. 150 mL 1   albuterol  (VENTOLIN  HFA) 108 (90 Base) MCG/ACT inhaler Inhale 1-2 puffs into the lungs every 6 (six) hours as needed for wheezing or shortness of breath. 1 each 0   apixaban  (ELIQUIS ) 5 MG TABS tablet Take 1 tablet (5 mg total) by mouth 2 (two) times daily. 180 tablet 3   atorvastatin  (LIPITOR ) 80 MG tablet Take 1 tablet (80 mg total) by mouth daily. 90 tablet 3   budesonide -formoterol  (SYMBICORT ) 80-4.5 MCG/ACT inhaler Inhale 2 puffs into the lungs 2 (two) times daily. 1 each 5   Continuous Glucose Receiver (FREESTYLE LIBRE 3 READER) DEVI UAD to monitor blood sugar 1 each 0   Continuous Glucose Sensor (FREESTYLE LIBRE 3 PLUS SENSOR) MISC Change sensor every 15 days. 2 each 6   digoxin  (LANOXIN ) 0.125 MG tablet Take 1 tablet (0.125 mg total) by mouth daily. 30 tablet 3   empagliflozin  (JARDIANCE ) 25 MG TABS tablet Take 1 tablet (25 mg total)  by mouth daily. 90 tablet 2   ezetimibe  (ZETIA ) 10 MG tablet Take 1 tablet (10 mg total) by mouth daily. 90 tablet 3   Fingerstix Lancets MISC Use as directed with testing supplies up to 4 times daily. 100 each 0   Furosemide  (FUROSCIX ) 80 MG/10ML CTKT Inject 80 mg into the skin as directed.     glucose blood test strip Use as instructed 100 each 12   insulin  glargine (LANTUS ) 100 UNIT/ML injection Inject 18 Units total into the skin 2 (two) times daily. 10 mL 5   Lancet Devices (TRUEDRAW LANCING DEVICE) MISC Use as directed up to four times daily 1 each 0   nitroGLYCERIN  (NITROSTAT ) 0.4 MG SL tablet Place 0.4 mg under  the tongue every 5 (five) minutes as needed for chest pain.     oxyCODONE -acetaminophen  (PERCOCET) 10-325 MG tablet Take 1 tablet by mouth every 4 (four) hours as needed for pain.     pantoprazole  (PROTONIX ) 40 MG tablet Take 1 tablet (40 mg total) by mouth daily. 30 tablet 3   potassium chloride  SA (KLOR-CON  M) 20 MEQ tablet Take 2 tablets (40 mEq total) by mouth daily. 60 tablet 3   sacubitril -valsartan  (ENTRESTO ) 24-26 MG Take 1 tablet by mouth 2 (two) times daily. 60 tablet 3   sotalol  (BETAPACE ) 160 MG tablet Take 1 tablet (160 mg total) by mouth 2 (two) times daily. 180 tablet 3   spironolactone  (ALDACTONE ) 25 MG tablet Take 0.5 tablets (12.5 mg total) by mouth daily. 45 tablet 3   torsemide  (DEMADEX ) 20 MG tablet Take 3 tablets (60 mg total) by mouth daily. 90 tablet 3   No current facility-administered medications for this visit.    PHYSICAL EXAM Vitals:   11/30/23 1500  BP: (!) 155/83  Pulse: 75  Temp: 98.4 F (36.9 C)  TempSrc: Temporal  SpO2: 98%  Weight: 215 lb (97.5 kg)  Height: 5\' 9"  (1.753 m)     Middle-age man in no distress Regular rate and rhythm Unlabored breathing Groin and thigh incisions healed on the left  PERTINENT LABORATORY AND RADIOLOGIC DATA  Most recent CBC    Latest Ref Rng & Units 11/09/2023    9:58 AM 09/28/2023   12:49 AM  08/06/2023    8:23 AM  CBC  WBC 4.0 - 10.5 K/uL 5.7  7.4  6.2   Hemoglobin 13.0 - 17.0 g/dL 13.0  86.5  78.4   Hematocrit 39.0 - 52.0 % 50.7  48.8  49.6   Platelets 150 - 400 K/uL 117  136  164      Most recent CMP    Latest Ref Rng & Units 11/30/2023   12:54 PM 11/16/2023   10:26 AM 11/09/2023    9:58 AM  CMP  Glucose 70 - 99 mg/dL 696  295  284   BUN 6 - 20 mg/dL 21  16  28    Creatinine 0.61 - 1.24 mg/dL 1.32  4.40  1.02   Sodium 135 - 145 mmol/L 140  135  135   Potassium 3.5 - 5.1 mmol/L 3.6  4.2  5.1   Chloride 98 - 111 mmol/L 103  105  104   CO2 22 - 32 mmol/L 22  23  25    Calcium  8.9 - 10.3 mg/dL 9.3  9.0  8.8     +-------+-----------+-----------+------------+------------+  ABI/TBIToday's ABIToday's TBIPrevious ABIPrevious TBI  +-------+-----------+-----------+------------+------------+  Right 0.65       0.51       0.62        0.46          +-------+-----------+-----------+------------+------------+  Left  0.55       0.30       0.67        0.56          +-------+-----------+-----------+------------+------------+    Left: No color flow or Doppler signal in the proximal to mid graft suggestive of occlusion.   Heber Little. Edgardo Goodwill, MD [facility] Phone Number: [phone] 11/30/2023 5:18 PM   Total time spent on preparing this encounter including chart review, data review, collecting history, examining the patient, coordinating care for this established patient, 20 minutes.   Portions of this report may have  been transcribed using voice recognition software.  Every effort has been made to ensure accuracy; however, inadvertent computerized transcription errors may still be present.

## 2023-11-29 NOTE — Progress Notes (Signed)
 ADVANCED HF CLINIC NOTE   PCP: Lawrance Presume, MD  HF Cardiologist: Dr. Julane Ny  HPI: Michael Barnett is a 53 y.o. male history of chronic systolic CHF/ICM s/p ICD, CAD s/p CABG (LIMA to LAD, SVG to OM) 2013 and PCI to RCA in 2016, VT s/p VT ablation in 05/24 on Sotalol , PAF, PAD s/p left fem-pop, OSA, CVA, poorly controlled DM II, recurrent dizziness/presyncope felt to be noncardiac. Has history of chronic atypical chest pain on review of outside records.  Previously followed by Cardiology at [facility] Had been followed by Advanced Heart Failure at OSH in past. Underwent evaluation for transplant but later declined d/t noncompliance. Established with Dr. Lorie Rook 12/24.   EF previously 25% 09/22. EF improved to 40-45% on echo 05/24.   R/LHC 12/24: occluded SVG to OM, patent LIMA to LAD, LIMA backfills the p LAD and its branches, also supplies collaterals to LCX, occluded p Cx, severe RCA disease, elevated filling pressures with preserved Fick CO. Echo 12/24: EF 45-50%, grade III DD, RV okay  Admitted 1/25 following ICD shock. On device interrogation rhythm appeared to be Afib with RVR >> inappropriate shock.  Admitted 09/28/23 with acute on chronic CHF and chest pain.  Echo with EF 30-35%, RWMA, grade III DD. Cardiology consulted. He was diuresed and GDMT titrated.   Struggling with volume overload and medication non-compliance since discharge. Used Furoscix  x 1 dose, did not complete 2 doses due to cramping.  Today he returns for HF follow up. Overall feeling fine. He has rare atypical chest pain, resolves spontaneously. He has mild SOB walking up steps. He has LE swelling and occasional palpitations. Occasional dizziness. Denies abnormal bleeding,  PND/Orthopnea. Appetite ok. Weight at home 210 pounds. Has not taken torsemide  x 3 weeks, says it makes legs cramp. Girlfriend helps with meds the best she can, just picked up digoxin  yesterday. Smoking 1/2 ppd, no ETOH or drugs.    Past  Medical History:  Diagnosis Date   AICD (automatic cardioverter/defibrillator) present    Arthritis    CHF (congestive heart failure) (HCC)    Coronary artery disease    Defibrillator activation    Diabetes mellitus    Hypertension    Myocardial infarction [facility])    Sleep apnea    Stroke [facility])    Current Outpatient Medications  Medication Sig Dispense Refill   albuterol  (PROVENTIL ) (2.5 MG/3ML) 0.083% nebulizer solution Take 3 mLs (2.5 mg total) by nebulization every 6 (six) hours as needed for wheezing or shortness of breath. 150 mL 1   albuterol  (VENTOLIN  HFA) 108 (90 Base) MCG/ACT inhaler Inhale 1-2 puffs into the lungs every 6 (six) hours as needed for wheezing or shortness of breath. 1 each 0   apixaban  (ELIQUIS ) 5 MG TABS tablet Take 1 tablet (5 mg total) by mouth 2 (two) times daily. 180 tablet 3   atorvastatin  (LIPITOR ) 80 MG tablet Take 1 tablet (80 mg total) by mouth daily. 90 tablet 3   budesonide -formoterol  (SYMBICORT ) 80-4.5 MCG/ACT inhaler Inhale 2 puffs into the lungs 2 (two) times daily. 1 each 5   Continuous Glucose Receiver (FREESTYLE LIBRE 3 READER) DEVI UAD to monitor blood sugar 1 each 0   Continuous Glucose Sensor (FREESTYLE LIBRE 3 PLUS SENSOR) MISC Change sensor every 15 days. 2 each 6   digoxin  (LANOXIN ) 0.125 MG tablet Take 1 tablet (0.125 mg total) by mouth daily. 30 tablet 3   empagliflozin  (JARDIANCE ) 25 MG TABS tablet Take 1 tablet (25 mg total) by mouth  daily. 90 tablet 2   ezetimibe  (ZETIA ) 10 MG tablet Take 1 tablet (10 mg total) by mouth daily. 90 tablet 3   Fingerstix Lancets MISC Use as directed with testing supplies up to 4 times daily. 100 each 0   Furosemide  (FUROSCIX ) 80 MG/10ML CTKT Inject 80 mg into the skin as directed.     glucose blood test strip Use as instructed 100 each 12   insulin  glargine (LANTUS ) 100 UNIT/ML injection Inject 18 Units total into the skin 2 (two) times daily. 10 mL 5   Lancet Devices (TRUEDRAW LANCING DEVICE) MISC Use as  directed up to four times daily 1 each 0   metolazone  (ZAROXOLYN ) 2.5 MG tablet Take 1 tablet (2.5 mg total) by mouth daily as needed (increasing leg swelling, shortness of breath or weight gain > 3lbs overnight). 30 tablet 0   nitroGLYCERIN  (NITROSTAT ) 0.4 MG SL tablet Place 0.4 mg under the tongue every 5 (five) minutes as needed for chest pain.     oxyCODONE -acetaminophen  (PERCOCET) 10-325 MG tablet Take 1 tablet by mouth every 4 (four) hours as needed for pain.     pantoprazole  (PROTONIX ) 40 MG tablet Take 1 tablet (40 mg total) by mouth daily. 30 tablet 3   potassium chloride  SA (KLOR-CON  M) 20 MEQ tablet Take 2 tablets (40 mEq total) by mouth daily. 60 tablet 3   sacubitril -valsartan  (ENTRESTO ) 24-26 MG Take 1 tablet by mouth 2 (two) times daily. 60 tablet 3   spironolactone  (ALDACTONE ) 25 MG tablet Take 0.5 tablets (12.5 mg total) by mouth daily. 45 tablet 3   torsemide  (DEMADEX ) 20 MG tablet Take 3 tablets (60 mg total) by mouth daily. 90 tablet 3   sotalol  (BETAPACE ) 160 MG tablet Take 1 tablet (160 mg total) by mouth 2 (two) times daily. (Patient not taking: Reported on 11/30/2023) 180 tablet 3   No current facility-administered medications for this encounter.   Allergies  Allergen Reactions   Victoza [Liraglutide] Other (See Comments)    Gave pt pancreatitis   Social History   Socioeconomic History   Marital status: Single    Spouse name: Not on file   Number of children: 3   Years of education: Not on file   Highest education level: Associate degree: academic program  Occupational History   Occupation: Disabilty  Tobacco Use   Smoking status: Former    Current packs/day: 0.00    Average packs/day: 0.5 packs/day for 35.0 years (17.5 ttl pk-yrs)    Types: Cigarettes    Start date: 69    Quit date: 06/30/2023    Years since quitting: 0.4   Smokeless tobacco: Not on file  Vaping Use   Vaping status: Never Used  Substance and Sexual Activity   Alcohol use: No   Drug use:  Never   Sexual activity: Yes  Other Topics Concern   Not on file  Social History Narrative   Not on file   Social Drivers of Health   Financial Resource Strain: Low Risk  (09/30/2023)   Overall Financial Resource Strain (CARDIA)    Difficulty of Paying Living Expenses: Not very hard  Food Insecurity: No Food Insecurity (10/05/2023)   Hunger Vital Sign    Worried About Running Out of Food in the Last Year: Never true    Ran Out of Food in the Last Year: Never true  Transportation Needs: No Transportation Needs (10/05/2023)   PRAPARE - Transportation    Lack of Transportation (Medical): No    Lack  of Transportation (Non-Medical): No  Physical Activity: Not on file  Stress: Not on file  Social Connections: Not on file  Intimate Partner Violence: Not At Risk (10/05/2023)   Humiliation, Afraid, Rape, and Kick questionnaire    Fear of Current or Ex-Partner: No    Emotionally Abused: No    Physically Abused: No    Sexually Abused: No    Family History  Problem Relation Age of Onset   Heart attack Mother    Heart attack Father    Heart attack Maternal Uncle    Wt Readings from Last 3 Encounters:  11/30/23 97.9 kg (215 lb 12.8 oz)  11/09/23 96 kg (211 lb 9.6 oz)  11/09/23 95.7 kg (211 lb)   BP 120/84   Pulse 68   Wt 97.9 kg (215 lb 12.8 oz)   SpO2 97%   BMI 31.87 kg/m   PHYSICAL EXAM: General:  NAD. No resp difficulty, walked into clinic HEENT: Normal Neck: Supple. No JVD. Cor: Regular rate & rhythm. No rubs, gallops or murmurs. Lungs: Clear Abdomen: Soft, nontender, nondistended.  Extremities: No cyanosis, clubbing, rash, 1+ BLE pre-tibial edema Neuro: Alert & oriented x 3, moves all 4 extremities w/o difficulty. Affect pleasant.  Device interrogation (personally reviewed): average HR 79 bpm, several short bursts of NSVT  ReDs reading: 36 %, normal  ASSESSMENT & PLAN: 1. Chronic systolic CHF due to iCM - s/p CABG in 2013 severe 3v CAD.  - R/LHC 12/24: LIMA to LAD  patent. SVG-OM occluded. LCx fills from L-L collats -> med RX - RHC 12/24 RA 8 PA 57/22 (37) PCW 19 Fick 5.9/2.7 - EF previously as low as 25% in 2022 - Echo 12/24: EF 45-50%, grade III DD, RV okay - Echo 3/25: EF 30-35% - Admit 4/25 for volume overload - NYHA II-early III today. He is mildly volume overloaded by exam, REDs 36%.  - Long discussion about medication compliance. - Continue torsemide  60 mg daily - Continue Jardiance  25 mg daily  - Continue spiro 12.5 mg daily - Continue Toprol  XL 25 mg daily - Continue Entresto  24/26 mg bid - Continue digoxin  0.125. Check level next visit. Just picked up med - Hemodynamics ok on RHC in 12/24. Will plan CPX testing this summer (when available) to better risk stratify for advanced therapies. Likely not transplant candidate with PAD, but can consider VAD if he can demonstrate compliance - Labs today  2. CAD - CABG X 2 in 2013 with SVG to OM and LIMA to LAD - PCI to RCA in 2016 - LHC 12/24 occluded SVG to OM, patent LIMA to LAD, LIMA backfills the p LAD and its branches, also supplies collaterals to LCX, occluded p Cx, severe RCA disease. No substrate for revascularization.  - Chronic atypical CP. - On asa and high-intensity statin - Managed by Dr. Lorie Rook   3. PAF - Regular on exam - Continue Eliquis  5 mg bid  4. VT/PVCs - S/p VT ablation in 05/24 at [facility] - Continue sotalol  - Followed by EP - several short bursts of NSVT on interrogation today.  5. PAD - S/p left fempop in 2024 - Has been following with podiatry for pre-ulcerative changes left foot.  - R ABI 0.6 in 01/25. ? Symptoms consistent with claudication. - Encourage to f/u with [facility]  6. OSA - CPAP machine broke - He has been referred back to Pulmonary  Follow up in 1 week with APP to reassess volume status and med compliance.   Arlice Bene  Nasiya Pascual, FNP  12:20 PM

## 2023-11-30 ENCOUNTER — Encounter: Payer: Self-pay | Admitting: Vascular Surgery

## 2023-11-30 ENCOUNTER — Ambulatory Visit (HOSPITAL_COMMUNITY): Payer: Self-pay | Admitting: Family Medicine

## 2023-11-30 ENCOUNTER — Ambulatory Visit (HOSPITAL_COMMUNITY)
Admission: RE | Admit: 2023-11-30 | Discharge: 2023-11-30 | Disposition: A | Discharge status: Home / Self Care | Payer: Medicaid Other | Source: Ambulatory Visit | Attending: Vascular Surgery | Admitting: Vascular Surgery

## 2023-11-30 ENCOUNTER — Ambulatory Visit: Payer: Medicaid Other | Admitting: Vascular Surgery

## 2023-11-30 ENCOUNTER — Ambulatory Visit (HOSPITAL_COMMUNITY)
Admission: RE | Admit: 2023-11-30 | Discharge: 2023-11-30 | Disposition: A | Discharge status: Home / Self Care | Payer: Medicaid Other | Source: Ambulatory Visit | Attending: Vascular Surgery

## 2023-11-30 ENCOUNTER — Ambulatory Visit (HOSPITAL_BASED_OUTPATIENT_CLINIC_OR_DEPARTMENT_OTHER)
Admission: RE | Admit: 2023-11-30 | Discharge: 2023-11-30 | Disposition: A | Discharge status: Home / Self Care | Source: Ambulatory Visit | Attending: Family Medicine

## 2023-11-30 ENCOUNTER — Encounter (HOSPITAL_COMMUNITY): Payer: Self-pay

## 2023-11-30 VITALS — BP 120/84 | HR 68 | Wt 215.8 lb

## 2023-11-30 VITALS — BP 155/83 | HR 75 | Temp 98.4°F | Ht 69.0 in | Wt 215.0 lb

## 2023-11-30 DIAGNOSIS — I739 Peripheral vascular disease, unspecified: Secondary | ICD-10-CM

## 2023-11-30 DIAGNOSIS — G4733 Obstructive sleep apnea (adult) (pediatric): Secondary | ICD-10-CM

## 2023-11-30 DIAGNOSIS — I5022 Chronic systolic (congestive) heart failure: Secondary | ICD-10-CM

## 2023-11-30 DIAGNOSIS — I2510 Atherosclerotic heart disease of native coronary artery without angina pectoris: Secondary | ICD-10-CM

## 2023-11-30 DIAGNOSIS — I70245 Atherosclerosis of native arteries of left leg with ulceration of other part of foot: Secondary | ICD-10-CM | POA: Diagnosis present

## 2023-11-30 DIAGNOSIS — I472 Ventricular tachycardia, unspecified: Secondary | ICD-10-CM

## 2023-11-30 DIAGNOSIS — I480 Paroxysmal atrial fibrillation: Secondary | ICD-10-CM | POA: Insufficient documentation

## 2023-11-30 LAB — BASIC METABOLIC PANEL WITH GFR
Anion gap: 15 (ref 5–15)
BUN: 21 mg/dL — ABNORMAL HIGH (ref 6–20)
CO2: 22 mmol/L (ref 22–32)
Calcium: 9.3 mg/dL (ref 8.9–10.3)
Chloride: 103 mmol/L (ref 98–111)
Creatinine, Ser: 1.12 mg/dL (ref 0.61–1.24)
GFR, Estimated: >60 mL/min (ref >60)
Glucose, Bld: 174 mg/dL — ABNORMAL HIGH (ref 70–99)
Potassium: 3.6 mmol/L (ref 3.5–5.1)
Sodium: 140 mmol/L (ref 135–145)

## 2023-11-30 LAB — VAS US ABI WITH/WO TBI
Left ABI: 0.55
Right ABI: 0.65

## 2023-11-30 LAB — BRAIN NATRIURETIC PEPTIDE: B Natriuretic Peptide: 650.1 pg/mL — ABNORMAL HIGH (ref 0.0–100.0)

## 2023-11-30 NOTE — Patient Instructions (Addendum)
 Medication Changes:  STOP METOLAZONE    DO NOT TAKE DIGOXIN  ON THE DAY OF YOUR NEXT APPOINTMENT  Lab Work:  Labs done today, your results will be available in MyChart, we will contact you for abnormal readings.  Follow-Up in: 1 WEEK AS SCHEDULED   At the [facility], you and your health needs are our priority. We have a designated team specialized in the treatment of Heart Failure. This Care Team includes your primary Heart Failure Specialized Cardiologist (physician), Advanced Practice Providers (APPs- Physician Assistants and Nurse Practitioners), and Pharmacist who all work together to provide you with the care you need, when you need it.   You may see any of the following providers on your designated Care Team at your next follow up:  Dr. Jules Oar Dr. Peder Bourdon Dr. Alwin Baars Dr. Judyth Nunnery Nieves Bars, NP Ruddy Corral, [place] [facility] [place], [place] Dennise Fitz, NP [place] Lee, NP Luster Salters, PharmD   Please be sure to bring in all your medications bottles to every appointment.   Need to Contact Us :  If you have any questions or concerns before your next appointment please send us  a message through [place] or call our office at [phone].    TO LEAVE A MESSAGE FOR THE NURSE SELECT OPTION 2, PLEASE LEAVE A MESSAGE INCLUDING: YOUR NAME DATE OF BIRTH CALL BACK NUMBER REASON FOR CALL**this is important as we prioritize the call backs  YOU WILL RECEIVE A CALL BACK THE SAME DAY AS LONG AS YOU CALL BEFORE 4:00 PM

## 2023-11-30 NOTE — Progress Notes (Signed)
 ReDS Vest / Clip - 11/30/23 1200       ReDS Vest / Clip   Station Marker C    Ruler Value 31    ReDS Value Range Moderate volume overload    ReDS Actual Value 36

## 2023-12-01 ENCOUNTER — Other Ambulatory Visit (HOSPITAL_COMMUNITY): Payer: Self-pay

## 2023-12-01 LAB — CUP PACEART REMOTE DEVICE CHECK
Battery Remaining Longevity: 11 mo
Battery Remaining Percentage: 11 %
Brady Statistic RV Percent Paced: 1 %
Date Time Interrogation Session: 20250603121000
HighPow Impedance: 63 Ohm
Implantable Lead Connection Status: 753985
Implantable Lead Location: 753860
Implantable Lead Model: 295
Implantable Lead Serial Number: 120138
Lead Channel Impedance Value: 492 Ohm
Lead Channel Pacing Threshold Amplitude: 0.8 V
Lead Channel Pacing Threshold Pulse Width: 0.5 ms
Lead Channel Setting Pacing Amplitude: 2.5 V
Lead Channel Setting Pacing Pulse Width: 0.5 ms
Lead Channel Setting Sensing Sensitivity: 0.5 mV
Pulse Gen Serial Number: 103678
Zone Setting Status: 755011

## 2023-12-02 ENCOUNTER — Ambulatory Visit: Payer: Self-pay | Admitting: Cardiovascular Disease

## 2023-12-02 ENCOUNTER — Other Ambulatory Visit: Payer: Self-pay | Admitting: *Deleted

## 2023-12-02 ENCOUNTER — Ambulatory Visit (INDEPENDENT_AMBULATORY_CARE_PROVIDER_SITE_OTHER)

## 2023-12-02 DIAGNOSIS — I739 Peripheral vascular disease, unspecified: Secondary | ICD-10-CM

## 2023-12-02 DIAGNOSIS — I5022 Chronic systolic (congestive) heart failure: Secondary | ICD-10-CM | POA: Diagnosis not present

## 2023-12-02 DIAGNOSIS — I70245 Atherosclerosis of native arteries of left leg with ulceration of other part of foot: Secondary | ICD-10-CM

## 2023-12-06 ENCOUNTER — Other Ambulatory Visit: Payer: Self-pay

## 2023-12-06 ENCOUNTER — Emergency Department (HOSPITAL_COMMUNITY)

## 2023-12-06 ENCOUNTER — Encounter (HOSPITAL_COMMUNITY): Payer: Self-pay

## 2023-12-06 ENCOUNTER — Telehealth (HOSPITAL_COMMUNITY): Payer: Self-pay

## 2023-12-06 ENCOUNTER — Emergency Department (HOSPITAL_COMMUNITY)
Admission: EM | Admit: 2023-12-06 | Discharge: 2023-12-07 | Discharge status: Against Medical Advice | Attending: Emergency Medicine | Admitting: Emergency Medicine

## 2023-12-06 DIAGNOSIS — I509 Heart failure, unspecified: Secondary | ICD-10-CM | POA: Insufficient documentation

## 2023-12-06 DIAGNOSIS — Z5321 Procedure and treatment not carried out due to patient leaving prior to being seen by health care provider: Secondary | ICD-10-CM | POA: Insufficient documentation

## 2023-12-06 DIAGNOSIS — R079 Chest pain, unspecified: Secondary | ICD-10-CM | POA: Insufficient documentation

## 2023-12-06 DIAGNOSIS — R200 Anesthesia of skin: Secondary | ICD-10-CM | POA: Insufficient documentation

## 2023-12-06 LAB — CBC
HCT: 50.2 % (ref 39.0–52.0)
Hemoglobin: 16.4 g/dL (ref 13.0–17.0)
MCH: 30.5 pg (ref 26.0–34.0)
MCHC: 32.7 g/dL (ref 30.0–36.0)
MCV: 93.5 fL (ref 80.0–100.0)
Platelets: 134 10*3/uL — ABNORMAL LOW (ref 150–400)
RBC: 5.37 MIL/uL (ref 4.22–5.81)
RDW: 13.7 % (ref 11.5–15.5)
WBC: 5.9 10*3/uL (ref 4.0–10.5)
nRBC: 0 % (ref 0.0–0.2)

## 2023-12-06 LAB — BASIC METABOLIC PANEL WITH GFR
Anion gap: 10 (ref 5–15)
BUN: 18 mg/dL (ref 6–20)
CO2: 22 mmol/L (ref 22–32)
Calcium: 8.7 mg/dL — ABNORMAL LOW (ref 8.9–10.3)
Chloride: 104 mmol/L (ref 98–111)
Creatinine, Ser: 1.11 mg/dL (ref 0.61–1.24)
GFR, Estimated: >60 mL/min (ref >60)
Glucose, Bld: 208 mg/dL — ABNORMAL HIGH (ref 70–99)
Potassium: 4 mmol/L (ref 3.5–5.1)
Sodium: 136 mmol/L (ref 135–145)

## 2023-12-06 LAB — TROPONIN I (HIGH SENSITIVITY)
Troponin I (High Sensitivity): 14 ng/L (ref <18)
Troponin I (High Sensitivity): 14 ng/L (ref <18)

## 2023-12-06 NOTE — ED Triage Notes (Signed)
 Patient reports chest pain when he breaths deeply, sharp chest pains, starting last night with hx of CHF. L hand feels numb and L arm feels heavy. Cardiologist last week reported fluid on lungs, gave fluid pills which he feels like only worked one day, then didn't help any more.

## 2023-12-06 NOTE — ED Provider Triage Note (Signed)
 Emergency Medicine Provider Triage Evaluation Note  Michael Barnett , a 53 y.o. male  was evaluated in triage.  Pt complains of CP onset last night, continues today. Worse with taking a deep breath, associated with left arm heaviness. Hx CHF.   Review of Systems  Positive:  Negative:   Physical Exam  There were no vitals taken for this visit. Gen:   Awake, no distress   Resp:  Normal effort  MSK:   Moves extremities without difficulty  Other:  Chest wall non tender, lungs CTA, HR RRR  Medical Decision Making  Medically screening exam initiated at 8:13 PM.  Appropriate orders placed.  Michael Barnett was informed that the remainder of the evaluation will be completed by another provider, this initial triage assessment does not replace that evaluation, and the importance of remaining in the ED until their evaluation is complete.     Darlis Eisenmenger, PA-C 12/06/23 2013

## 2023-12-06 NOTE — Telephone Encounter (Signed)
 Called to confirm/remind patient of their appointment at the [facility] on 12/07/2023 2:30.   Appointment:   [x] Confirmed  [] Left mess   [] No answer/No voice mail  [] VM Full/unable to leave message  [] Phone not in service  Patient reminded to bring all medications and/or complete list.  Confirmed patient has transportation. Gave directions, instructed to utilize valet parking.

## 2023-12-07 ENCOUNTER — Emergency Department (HOSPITAL_COMMUNITY)
Admission: RE | Admit: 2023-12-07 | Discharge: 2023-12-07 | Disposition: A | Discharge status: Home / Self Care | Source: Ambulatory Visit | Attending: Physician Assistant

## 2023-12-07 ENCOUNTER — Other Ambulatory Visit (HOSPITAL_COMMUNITY): Payer: Self-pay

## 2023-12-07 VITALS — BP 104/60 | HR 76 | Wt 217.6 lb

## 2023-12-07 DIAGNOSIS — R200 Anesthesia of skin: Secondary | ICD-10-CM | POA: Diagnosis not present

## 2023-12-07 DIAGNOSIS — I25112 Atherosclerotic heart disease of native coronary artery with refractory angina pectoris: Secondary | ICD-10-CM | POA: Diagnosis not present

## 2023-12-07 DIAGNOSIS — I480 Paroxysmal atrial fibrillation: Secondary | ICD-10-CM | POA: Diagnosis not present

## 2023-12-07 DIAGNOSIS — I255 Ischemic cardiomyopathy: Secondary | ICD-10-CM

## 2023-12-07 DIAGNOSIS — I5022 Chronic systolic (congestive) heart failure: Secondary | ICD-10-CM

## 2023-12-07 DIAGNOSIS — R079 Chest pain, unspecified: Secondary | ICD-10-CM | POA: Diagnosis not present

## 2023-12-07 DIAGNOSIS — I509 Heart failure, unspecified: Secondary | ICD-10-CM | POA: Diagnosis not present

## 2023-12-07 DIAGNOSIS — I472 Ventricular tachycardia, unspecified: Secondary | ICD-10-CM

## 2023-12-07 LAB — COMPREHENSIVE METABOLIC PANEL WITH GFR
ALT: 28 U/L (ref 0–44)
AST: 27 U/L (ref 15–41)
Albumin: 3.3 g/dL — ABNORMAL LOW (ref 3.5–5.0)
Alkaline Phosphatase: 110 U/L (ref 38–126)
Anion gap: 8 (ref 5–15)
BUN: 19 mg/dL (ref 6–20)
CO2: 24 mmol/L (ref 22–32)
Calcium: 8.8 mg/dL — ABNORMAL LOW (ref 8.9–10.3)
Chloride: 104 mmol/L (ref 98–111)
Creatinine, Ser: 1.16 mg/dL (ref 0.61–1.24)
GFR, Estimated: >60 mL/min (ref >60)
Glucose, Bld: 258 mg/dL — ABNORMAL HIGH (ref 70–99)
Potassium: 4.3 mmol/L (ref 3.5–5.1)
Sodium: 136 mmol/L (ref 135–145)
Total Bilirubin: 1.3 mg/dL — ABNORMAL HIGH (ref 0.0–1.2)
Total Protein: 6.1 g/dL — ABNORMAL LOW (ref 6.5–8.1)

## 2023-12-07 LAB — SEDIMENTATION RATE: Sed Rate: 3 mm/h (ref 0–16)

## 2023-12-07 LAB — CBC
HCT: 49.5 % (ref 39.0–52.0)
Hemoglobin: 16.3 g/dL (ref 13.0–17.0)
MCH: 30.5 pg (ref 26.0–34.0)
MCHC: 32.9 g/dL (ref 30.0–36.0)
MCV: 92.5 fL (ref 80.0–100.0)
Platelets: 120 10*3/uL — ABNORMAL LOW (ref 150–400)
RBC: 5.35 MIL/uL (ref 4.22–5.81)
RDW: 13.8 % (ref 11.5–15.5)
WBC: 6.4 10*3/uL (ref 4.0–10.5)
nRBC: 0 % (ref 0.0–0.2)

## 2023-12-07 LAB — TROPONIN I (HIGH SENSITIVITY): Troponin I (High Sensitivity): 18 ng/L — ABNORMAL HIGH (ref <18)

## 2023-12-07 LAB — C-REACTIVE PROTEIN: CRP: 0.7 mg/dL (ref <1.0)

## 2023-12-07 LAB — DIGOXIN LEVEL: Digoxin Level: 0.6 ng/mL — ABNORMAL LOW (ref 0.8–2.0)

## 2023-12-07 LAB — BRAIN NATRIURETIC PEPTIDE: B Natriuretic Peptide: 417.6 pg/mL — ABNORMAL HIGH (ref 0.0–100.0)

## 2023-12-07 NOTE — ED Notes (Signed)
 Patient was called several times for room - no response. Pulled patient OTF.

## 2023-12-07 NOTE — H&P (View-Only) (Signed)
 ADVANCED HF CLINIC NOTE   PCP: Lawrance Presume, MD  HF Cardiologist: Dr. Julane Ny  HPI: Michael Barnett is a 53 y.o. male history of chronic systolic CHF/ICM s/p ICD, CAD s/p CABG (LIMA to LAD, SVG to OM) 2013 and PCI to RCA in 2016, VT s/p VT ablation in 05/24 on Sotalol , PAF, PAD s/p left fem-pop, OSA, CVA, poorly controlled DM II, recurrent dizziness/presyncope felt to be noncardiac. Has history of chronic atypical chest pain on review of outside records.   Previously followed by Cardiology at [facility] Had been followed by Advanced Heart Failure at OSH in past. Underwent evaluation for transplant but later declined d/t noncompliance. Established with Dr. Lorie Rook 12/24.   EF previously 25% 09/22. EF improved to 40-45% on echo 05/24.   R/LHC 12/24: occluded SVG to OM, patent LIMA to LAD, LIMA backfills the p LAD and its branches, also supplies collaterals to LCX, occluded p Cx, severe RCA disease, elevated filling pressures with preserved Fick CO. Echo 12/24: EF 45-50%, grade III DD, RV okay  Admitted 1/25 following ICD shock. On device interrogation rhythm appeared to be Afib with RVR >> inappropriate shock.  Admitted 09/28/23 with acute on chronic CHF and chest pain.  Echo with EF 30-35%, RWMA, grade III DD. Cardiology consulted. He was diuresed and GDMT titrated.   Struggling with volume overload and medication non-compliance since discharge. Used Furoscix  x 1 dose, did not complete 2 doses due to cramping.  Seen for f/u 11/30/23. Volume overloaded. Had not taken Torsemide  several days d/t cramping.  Here today for CHF follow-up. He reports severe, sharp left sided chest pain starting evening of 06/08. Episodes last about 10 minutes in duration and are worse with deep breathing. Pain not brought on by physical exertion. Around the same time he began noticing numbness and tingling in both hands and left arm heaviness. Had similar symptoms prior to CABG in 2013. He is very concerned  about the possibility of a new blockage. He was seen in the ED last night. HS troponin negative X 2. He left after waiting 5 hrs. Notes some shortness of breath with exertion. No orthopnea or PND. A little bit of leg edema.  Girlfriend helps with meds the best she can. Reports he has been adherent with medical therapy. Smoking 1/2 ppd, no ETOH or drugs.    Past Medical History:  Diagnosis Date   AICD (automatic cardioverter/defibrillator) present    Arthritis    CHF (congestive heart failure) (HCC)    Coronary artery disease    Defibrillator activation    Diabetes mellitus    Hypertension    Myocardial infarction [facility])    Sleep apnea    Stroke [facility])    Current Outpatient Medications  Medication Sig Dispense Refill   albuterol  (PROVENTIL ) (2.5 MG/3ML) 0.083% nebulizer solution Take 3 mLs (2.5 mg total) by nebulization every 6 (six) hours as needed for wheezing or shortness of breath. 150 mL 1   albuterol  (VENTOLIN  HFA) 108 (90 Base) MCG/ACT inhaler Inhale 1-2 puffs into the lungs every 6 (six) hours as needed for wheezing or shortness of breath. 1 each 0   apixaban  (ELIQUIS ) 5 MG TABS tablet Take 1 tablet (5 mg total) by mouth 2 (two) times daily. 180 tablet 3   atorvastatin  (LIPITOR ) 80 MG tablet Take 1 tablet (80 mg total) by mouth daily. 90 tablet 3   budesonide -formoterol  (SYMBICORT ) 80-4.5 MCG/ACT inhaler Inhale 2 puffs into the lungs 2 (two) times daily. 1 each 5  Continuous Glucose Receiver (FREESTYLE LIBRE 3 READER) DEVI UAD to monitor blood sugar 1 each 0   Continuous Glucose Sensor (FREESTYLE LIBRE 3 PLUS SENSOR) MISC Change sensor every 15 days. 2 each 6   digoxin  (LANOXIN ) 0.125 MG tablet Take 1 tablet (0.125 mg total) by mouth daily. 30 tablet 3   empagliflozin  (JARDIANCE ) 25 MG TABS tablet Take 1 tablet (25 mg total) by mouth daily. 90 tablet 2   ezetimibe  (ZETIA ) 10 MG tablet Take 1 tablet (10 mg total) by mouth daily. 90 tablet 3   Fingerstix Lancets MISC Use as directed  with testing supplies up to 4 times daily. 100 each 0   Furosemide  (FUROSCIX ) 80 MG/10ML CTKT Inject 80 mg into the skin as directed.     glucose blood test strip Use as instructed 100 each 12   ibuprofen  (ADVIL ) 600 MG tablet Take 600 mg by mouth every 6 (six) hours as needed.     insulin  glargine (LANTUS ) 100 UNIT/ML injection Inject 18 Units total into the skin 2 (two) times daily. 10 mL 5   Lancet Devices (TRUEDRAW LANCING DEVICE) MISC Use as directed up to four times daily 1 each 0   nitroGLYCERIN  (NITROSTAT ) 0.4 MG SL tablet Place 0.4 mg under the tongue every 5 (five) minutes as needed for chest pain.     oxyCODONE -acetaminophen  (PERCOCET) 10-325 MG tablet Take 1 tablet by mouth every 4 (four) hours as needed for pain.     pantoprazole  (PROTONIX ) 40 MG tablet Take 1 tablet (40 mg total) by mouth daily. 30 tablet 3   potassium chloride  SA (KLOR-CON  M) 20 MEQ tablet Take 2 tablets (40 mEq total) by mouth daily. 60 tablet 3   sacubitril -valsartan  (ENTRESTO ) 24-26 MG Take 1 tablet by mouth 2 (two) times daily. 60 tablet 3   sotalol  (BETAPACE ) 160 MG tablet Take 1 tablet (160 mg total) by mouth 2 (two) times daily. 180 tablet 3   spironolactone  (ALDACTONE ) 25 MG tablet Take 0.5 tablets (12.5 mg total) by mouth daily. 45 tablet 3   torsemide  (DEMADEX ) 20 MG tablet Take 3 tablets (60 mg total) by mouth daily. 90 tablet 3   No current facility-administered medications for this encounter.   Allergies  Allergen Reactions   Victoza [Liraglutide] Other (See Comments)    Gave pt pancreatitis   Social History   Socioeconomic History   Marital status: Single    Spouse name: Not on file   Number of children: 3   Years of education: Not on file   Highest education level: Associate degree: academic program  Occupational History   Occupation: Disabilty  Tobacco Use   Smoking status: Some Days    Current packs/day: 0.00    Average packs/day: 0.5 packs/day for 35.0 years (17.5 ttl pk-yrs)     Types: Cigarettes    Start date: 32    Last attempt to quit: 06/30/2023    Years since quitting: 0.4   Smokeless tobacco: Not on file  Vaping Use   Vaping status: Never Used  Substance and Sexual Activity   Alcohol use: No   Drug use: Never   Sexual activity: Yes  Other Topics Concern   Not on file  Social History Narrative   Not on file   Social Drivers of Health   Financial Resource Strain: Low Risk  (09/30/2023)   Overall Financial Resource Strain (CARDIA)    Difficulty of Paying Living Expenses: Not very hard  Food Insecurity: No Food Insecurity (10/05/2023)   Hunger  Vital Sign    Worried About Programme researcher, broadcasting/film/video in the Last Year: Never true    Ran Out of Food in the Last Year: Never true  Transportation Needs: No Transportation Needs (10/05/2023)   PRAPARE - Administrator, Civil Service (Medical): No    Lack of Transportation (Non-Medical): No  Physical Activity: Not on file  Stress: Not on file  Social Connections: Not on file  Intimate Partner Violence: Not At Risk (10/05/2023)   Humiliation, Afraid, Rape, and Kick questionnaire    Fear of Current or Ex-Partner: No    Emotionally Abused: No    Physically Abused: No    Sexually Abused: No    Family History  Problem Relation Age of Onset   Heart attack Mother    Heart attack Father    Heart attack Maternal Uncle    Wt Readings from Last 3 Encounters:  12/07/23 98.7 kg (217 lb 9.6 oz)  11/30/23 97.5 kg (215 lb)  11/30/23 97.9 kg (215 lb 12.8 oz)   BP 104/60   Pulse 76   Wt 98.7 kg (217 lb 9.6 oz)   SpO2 98%   BMI 32.13 kg/m   PHYSICAL EXAM: General:  Well appearing.  Neck: no JVD Cor: Regular rate & rhythm. No rubs, gallops or murmurs. Lungs: clear Abdomen: soft, nontender, nondistended.  Extremities: 1+ edema, extremities are cool Neuro: alert & orientedx3. Affect pleasant   Device interrogation (personally reviewed): No HL data, activity level 1 hr/day, 2 NSVT 06/06 and 1 on 06/07  (longest 21 seconds)  ASSESSMENT & PLAN: 1. Chronic systolic CHF due to iCM - s/p CABG in 2013 severe 3v CAD.  - R/LHC 12/24: LIMA to LAD patent. SVG-OM occluded. LCx fills from L-L collats -> med RX - RHC 12/24 RA 8 PA 57/22 (37) PCW 19 Fick 5.9/2.7 - EF previously as low as 25% in 2022 - Echo 12/24: EF 45-50%, grade III DD, RV okay - Echo 3/25: EF 30-35% - Admit 4/25 for volume overload - NYHA III. Very mild volume overload by exam. Reports he has been adherent with medical therapy. - Planning for [facility] tomorrow (see below), will reassess volume status/hemodynamics with [facility] at that time. - Continue torsemide  60 mg daily for now - Continue Jardiance  25 mg daily  - Continue spiro 12.5 mg daily - Continue Toprol  XL 25 mg daily - Continue Entresto  24/26 mg bid - Continue digoxin  0.125. Check digoxin  level. - Check CMET/BNP today - Repeat echo ordered - Plan CPX testing this summer (order in) to better risk stratify for advanced therapies. Likely not transplant candidate with PAD, but can consider VAD if he can demonstrate compliance  2. CAD - CABG X 2 in 2013 with SVG to OM and LIMA to LAD - PCI to RCA in 2016 - [facility] 12/24 occluded SVG to OM, patent LIMA to LAD, LIMA backfills the p LAD and its branches, also supplies collaterals to LCX, occluded p Cx, severe RCA disease. No substrate for revascularization.  - Hx of chronic atypical CP. Last few days notes sharp, severe left sided chest pain that comes and goes, up to 10 minutes in duration. Not exertional, worse with inspiration. Also notes bilateral hand numbness/tingling and left arm heaviness. Symptoms somewhat atypical but he reports they are reminiscent of his prior angina. Has been seen in ED. No acute changes on ECG. Recheck HS troponin (negative X 2 last night). Add on ESR and CRP given pleuritic nature of symptoms. Discussed  with Dr. Mitzie Anda. Will set up for [facility] with Interventional team tomorrow, not sure there will be any target for  PCI. Updated his primary Cardiologist, Dr. Lorie Rook. - On asa and Atorvastatin  80 + zetia   3. PAF - SR on ECG today - Continue Eliquis  5 mg bid  4. VT/PVCs - S/p VT ablation in 05/24 at [facility] - Continue sotalol  - Followed by EP - several runs of NSVT on device today  5. PAD - S/p left fempop in 05/2023 - now thrombosed - Followed by [facility]  6. OSA - CPAP machine broke - He has been referred back to Pulmonary but could not be reached for an appointment  Follow-up: 2 weeks with APP  Ophie Burrowes N, PA-C  4:17 PM

## 2023-12-07 NOTE — ED Provider Notes (Signed)
 I was informed by nursing that patient has eloped from the emergency department pending his evaluation.   Onetha Bile, MD 12/07/23 [phone]

## 2023-12-07 NOTE — Patient Instructions (Addendum)
 Medication Changes:  No Changes In Medications at this time.   Lab Work:  Labs done today, your results will be available in MyChart, we will contact you for abnormal readings.  Testing/Procedures:  Your physician has requested that you have a cardiac catheterization. Cardiac catheterization is used to diagnose and/or treat various heart conditions. Doctors may recommend this procedure for a number of different reasons. The most common reason is to evaluate chest pain. Chest pain can be a symptom of coronary artery disease (CAD), and cardiac catheterization can show whether plaque is narrowing or blocking your heart's arteries. This procedure is also used to evaluate the valves, as well as measure the blood flow and oxygen levels in different parts of your heart. For further information please visit [email or web address] Please follow instruction sheet, as given.   Your physician has requested that you have an echocardiogram AS SCHEDULED NEXT AVAILABLE . Echocardiography is a painless test that uses sound waves to create images of your heart. It provides your doctor with information about the size and shape of your heart and how well your heart's chambers and valves are working. This procedure takes approximately one hour. There are no restrictions for this procedure. Please do NOT wear cologne, perfume, aftershave, or lotions (deodorant is allowed). Please arrive 15 minutes prior to your appointment time.  Please note: We ask at that you not bring children with you during ultrasound (echo/ vascular) testing. Due to room size and safety concerns, children are not allowed in the ultrasound rooms during exams. Our front office staff cannot provide observation of children in our lobby area while testing is being conducted. An adult accompanying a patient to their appointment will only be allowed in the ultrasound room at the discretion of the ultrasound technician under special circumstances. We  apologize for any inconvenience.   Special Instructions // Education:   [facility] [address] [place] [address] Dept: [phone] Loc: [phone]  Michael Barnett  12/07/2023  You are scheduled for a Cardiac Catheterization on Wednesday, June 11 with Dr. Arnoldo Lapping.  1. Please arrive at the Operating Room Services (Main Entrance A) at [facility]: [address] [place], [place] [address] at 11:00 AM (This time is 2 hour(s) before your procedure to ensure your preparation).   Free valet parking service is available. You will check in at ADMITTING. The support person will be asked to wait in the waiting room.  It is OK to have someone drop you off and come back when you are ready to be discharged.    Special note: Every effort is made to have your procedure done on time. Please understand that emergencies sometimes delay scheduled procedures.  2. Diet: Do not eat solid foods after midnight.  The patient may have clear liquids until 5am upon the day of the procedure.  3. Labs: You will need to have blood drawn on TODAY   4. Medication instructions in preparation for your procedure:  DO NOT TAKE ELIQUIS  TONIGHT OR TOMORROW MORNING   DO NOT TAKE JARDIANCE  THE MORNING OF PROCEDURE   TAKE (1/2) DOSE OF INSULIN  TONIGHT   DO NOT TAKE ANY DIABETES MEDICATION THE MORNING OF PROCEDURE   DO NOT TAKE TORSEMIDE  THE MORNING OF PROCEDURE    Contrast Allergy: No  5. Plan to go home the same day, you will only stay overnight if medically necessary. 6. Bring a current list of your medications and current  insurance cards. 7. You MUST have a responsible person to drive you home. 8. Someone MUST be with you the first 24 hours after you arrive home or your discharge will be delayed. 9. Please wear clothes that are easy to get on and off and wear slip-on shoes.  Thank you for allowing us  to care for you!    -- [facility]    Follow-Up in: 2 WEEKS AS SCHEDULED   At the [facility], you and your health needs are our priority. We have a designated team specialized in the treatment of Heart Failure. This Care Team includes your primary Heart Failure Specialized Cardiologist (physician), Advanced Practice Providers (APPs- Physician Assistants and Nurse Practitioners), and Pharmacist who all work together to provide you with the care you need, when you need it.   You may see any of the following providers on your designated Care Team at your next follow up:  Dr. Jules Oar Dr. Peder Bourdon Dr. Alwin Baars Dr. Judyth Nunnery Nieves Bars, NP Ruddy Corral, [place] [facility] [place], [place] Dennise Fitz, NP [place] Lee, NP Luster Salters, PharmD   Please be sure to bring in all your medications bottles to every appointment.   Need to Contact Us :  If you have any questions or concerns before your next appointment please send us  a message through [place] or call our office at [phone].    TO LEAVE A MESSAGE FOR THE NURSE SELECT OPTION 2, PLEASE LEAVE A MESSAGE INCLUDING: YOUR NAME DATE OF BIRTH CALL BACK NUMBER REASON FOR CALL**this is important as we prioritize the call backs  YOU WILL RECEIVE A CALL BACK THE SAME DAY AS LONG AS YOU CALL BEFORE 4:00 PM

## 2023-12-07 NOTE — Progress Notes (Signed)
 ADVANCED HF CLINIC NOTE   PCP: Lawrance Presume, MD  HF Cardiologist: Dr. Julane Ny  HPI: Michael Barnett is a 53 y.o. male history of chronic systolic CHF/ICM s/p ICD, CAD s/p CABG (LIMA to LAD, SVG to OM) 2013 and PCI to RCA in 2016, VT s/p VT ablation in 05/24 on Sotalol , PAF, PAD s/p left fem-pop, OSA, CVA, poorly controlled DM II, recurrent dizziness/presyncope felt to be noncardiac. Has history of chronic atypical chest pain on review of outside records.   Previously followed by Cardiology at [facility] Had been followed by Advanced Heart Failure at OSH in past. Underwent evaluation for transplant but later declined d/t noncompliance. Established with Michael Barnett 12/24.   EF previously 25% 09/22. EF improved to 40-45% on echo 05/24.   R/LHC 12/24: occluded SVG to OM, patent LIMA to LAD, LIMA backfills the p LAD and its branches, also supplies collaterals to LCX, occluded p Cx, severe RCA disease, elevated filling pressures with preserved Fick CO. Echo 12/24: EF 45-50%, grade III DD, RV okay  Admitted 1/25 following ICD shock. On device interrogation rhythm appeared to be Afib with RVR >> inappropriate shock.  Admitted 09/28/23 with acute on chronic CHF and chest pain.  Echo with EF 30-35%, RWMA, grade III DD. Cardiology consulted. He was diuresed and GDMT titrated.   Struggling with volume overload and medication non-compliance since discharge. Used Furoscix  x 1 dose, did not complete 2 doses due to cramping.  Seen for f/u 11/30/23. Volume overloaded. Had not taken Torsemide  several days d/t cramping.  Here today for CHF follow-up. He reports severe, sharp left sided chest pain starting evening of 06/08. Episodes last about 10 minutes in duration and are worse with deep breathing. Pain not brought on by physical exertion. Around the same time he began noticing numbness and tingling in both hands and left arm heaviness. Had similar symptoms prior to CABG in 2013. He is very concerned  about the possibility of a new blockage. He was seen in the ED last night. HS troponin negative X 2. He left after waiting 5 hrs. Notes some shortness of breath with exertion. No orthopnea or PND. A little bit of leg edema.  Girlfriend helps with meds the best she can. Reports he has been adherent with medical therapy. Smoking 1/2 ppd, no ETOH or drugs.    Past Medical History:  Diagnosis Date   AICD (automatic cardioverter/defibrillator) present    Arthritis    CHF (congestive heart failure) (HCC)    Coronary artery disease    Defibrillator activation    Diabetes mellitus    Hypertension    Myocardial infarction [facility])    Sleep apnea    Stroke [facility])    Current Outpatient Medications  Medication Sig Dispense Refill   albuterol  (PROVENTIL ) (2.5 MG/3ML) 0.083% nebulizer solution Take 3 mLs (2.5 mg total) by nebulization every 6 (six) hours as needed for wheezing or shortness of breath. 150 mL 1   albuterol  (VENTOLIN  HFA) 108 (90 Base) MCG/ACT inhaler Inhale 1-2 puffs into the lungs every 6 (six) hours as needed for wheezing or shortness of breath. 1 each 0   apixaban  (ELIQUIS ) 5 MG TABS tablet Take 1 tablet (5 mg total) by mouth 2 (two) times daily. 180 tablet 3   atorvastatin  (LIPITOR ) 80 MG tablet Take 1 tablet (80 mg total) by mouth daily. 90 tablet 3   budesonide -formoterol  (SYMBICORT ) 80-4.5 MCG/ACT inhaler Inhale 2 puffs into the lungs 2 (two) times daily. 1 each 5  Continuous Glucose Receiver (FREESTYLE LIBRE 3 READER) DEVI UAD to monitor blood sugar 1 each 0   Continuous Glucose Sensor (FREESTYLE LIBRE 3 PLUS SENSOR) MISC Change sensor every 15 days. 2 each 6   digoxin  (LANOXIN ) 0.125 MG tablet Take 1 tablet (0.125 mg total) by mouth daily. 30 tablet 3   empagliflozin  (JARDIANCE ) 25 MG TABS tablet Take 1 tablet (25 mg total) by mouth daily. 90 tablet 2   ezetimibe  (ZETIA ) 10 MG tablet Take 1 tablet (10 mg total) by mouth daily. 90 tablet 3   Fingerstix Lancets MISC Use as directed  with testing supplies up to 4 times daily. 100 each 0   Furosemide  (FUROSCIX ) 80 MG/10ML CTKT Inject 80 mg into the skin as directed.     glucose blood test strip Use as instructed 100 each 12   ibuprofen  (ADVIL ) 600 MG tablet Take 600 mg by mouth every 6 (six) hours as needed.     insulin  glargine (LANTUS ) 100 UNIT/ML injection Inject 18 Units total into the skin 2 (two) times daily. 10 mL 5   Lancet Devices (TRUEDRAW LANCING DEVICE) MISC Use as directed up to four times daily 1 each 0   nitroGLYCERIN  (NITROSTAT ) 0.4 MG SL tablet Place 0.4 mg under the tongue every 5 (five) minutes as needed for chest pain.     oxyCODONE -acetaminophen  (PERCOCET) 10-325 MG tablet Take 1 tablet by mouth every 4 (four) hours as needed for pain.     pantoprazole  (PROTONIX ) 40 MG tablet Take 1 tablet (40 mg total) by mouth daily. 30 tablet 3   potassium chloride  SA (KLOR-CON  M) 20 MEQ tablet Take 2 tablets (40 mEq total) by mouth daily. 60 tablet 3   sacubitril -valsartan  (ENTRESTO ) 24-26 MG Take 1 tablet by mouth 2 (two) times daily. 60 tablet 3   sotalol  (BETAPACE ) 160 MG tablet Take 1 tablet (160 mg total) by mouth 2 (two) times daily. 180 tablet 3   spironolactone  (ALDACTONE ) 25 MG tablet Take 0.5 tablets (12.5 mg total) by mouth daily. 45 tablet 3   torsemide  (DEMADEX ) 20 MG tablet Take 3 tablets (60 mg total) by mouth daily. 90 tablet 3   No current facility-administered medications for this encounter.   Allergies  Allergen Reactions   Victoza [Liraglutide] Other (See Comments)    Gave pt pancreatitis   Social History   Socioeconomic History   Marital status: Single    Spouse name: Not on file   Number of children: 3   Years of education: Not on file   Highest education level: Associate degree: academic program  Occupational History   Occupation: Disabilty  Tobacco Use   Smoking status: Some Days    Current packs/day: 0.00    Average packs/day: 0.5 packs/day for 35.0 years (17.5 ttl pk-yrs)     Types: Cigarettes    Start date: 32    Last attempt to quit: 06/30/2023    Years since quitting: 0.4   Smokeless tobacco: Not on file  Vaping Use   Vaping status: Never Used  Substance and Sexual Activity   Alcohol use: No   Drug use: Never   Sexual activity: Yes  Other Topics Concern   Not on file  Social History Narrative   Not on file   Social Drivers of Health   Financial Resource Strain: Low Risk  (09/30/2023)   Overall Financial Resource Strain (CARDIA)    Difficulty of Paying Living Expenses: Not very hard  Food Insecurity: No Food Insecurity (10/05/2023)   Hunger  Vital Sign    Worried About Programme researcher, broadcasting/film/video in the Last Year: Never true    Ran Out of Food in the Last Year: Never true  Transportation Needs: No Transportation Needs (10/05/2023)   PRAPARE - Administrator, Civil Service (Medical): No    Lack of Transportation (Non-Medical): No  Physical Activity: Not on file  Stress: Not on file  Social Connections: Not on file  Intimate Partner Violence: Not At Risk (10/05/2023)   Humiliation, Afraid, Rape, and Kick questionnaire    Fear of Current or Ex-Partner: No    Emotionally Abused: No    Physically Abused: No    Sexually Abused: No    Family History  Problem Relation Age of Onset   Heart attack Mother    Heart attack Father    Heart attack Maternal Uncle    Wt Readings from Last 3 Encounters:  12/07/23 98.7 kg (217 lb 9.6 oz)  11/30/23 97.5 kg (215 lb)  11/30/23 97.9 kg (215 lb 12.8 oz)   BP 104/60   Pulse 76   Wt 98.7 kg (217 lb 9.6 oz)   SpO2 98%   BMI 32.13 kg/m   PHYSICAL EXAM: General:  Well appearing.  Neck: no JVD Cor: Regular rate & rhythm. No rubs, gallops or murmurs. Lungs: clear Abdomen: soft, nontender, nondistended.  Extremities: 1+ edema, extremities are cool Neuro: alert & orientedx3. Affect pleasant   Device interrogation (personally reviewed): No HL data, activity level 1 hr/day, 2 NSVT 06/06 and 1 on 06/07  (longest 21 seconds)  ASSESSMENT & PLAN: 1. Chronic systolic CHF due to iCM - s/p CABG in 2013 severe 3v CAD.  - R/LHC 12/24: LIMA to LAD patent. SVG-OM occluded. LCx fills from L-L collats -> med RX - RHC 12/24 RA 8 PA 57/22 (37) PCW 19 Fick 5.9/2.7 - EF previously as low as 25% in 2022 - Echo 12/24: EF 45-50%, grade III DD, RV okay - Echo 3/25: EF 30-35% - Admit 4/25 for volume overload - NYHA III. Very mild volume overload by exam. Reports he has been adherent with medical therapy. - Planning for [facility] tomorrow (see below), will reassess volume status/hemodynamics with [facility] at that time. - Continue torsemide  60 mg daily for now - Continue Jardiance  25 mg daily  - Continue spiro 12.5 mg daily - Continue Toprol  XL 25 mg daily - Continue Entresto  24/26 mg bid - Continue digoxin  0.125. Check digoxin  level. - Check CMET/BNP today - Repeat echo ordered - Plan CPX testing this summer (order in) to better risk stratify for advanced therapies. Likely not transplant candidate with PAD, but can consider VAD if he can demonstrate compliance  2. CAD - CABG X 2 in 2013 with SVG to OM and LIMA to LAD - PCI to RCA in 2016 - LHC 12/24 occluded SVG to OM, patent LIMA to LAD, LIMA backfills the p LAD and its branches, also supplies collaterals to LCX, occluded p Cx, severe RCA disease. No substrate for revascularization.  - Hx of chronic atypical CP. Last few days notes sharp, severe left sided chest pain that comes and goes, up to 10 minutes in duration. Not exertional, worse with inspiration. Also notes bilateral hand numbness/tingling and left arm heaviness. Symptoms somewhat atypical but he reports they are reminiscent of his prior angina. Has been seen in ED. No acute changes on ECG. Recheck HS troponin (negative X 2 last night). Add on ESR and CRP given pleuritic nature of symptoms. Discussed  with Dr. Mitzie Anda. Will set up for [facility] with Interventional team tomorrow, not sure there will be any target for  PCI. Updated his primary Cardiologist, Michael Barnett. - On asa and Atorvastatin  80 + zetia   3. PAF - SR on ECG today - Continue Eliquis  5 mg bid  4. VT/PVCs - S/p VT ablation in 05/24 at [facility] - Continue sotalol  - Followed by EP - several runs of NSVT on device today  5. PAD - S/p left fempop in 05/2023 - now thrombosed - Followed by [facility]  6. OSA - CPAP machine broke - He has been referred back to Pulmonary but could not be reached for an appointment  Follow-up: 2 weeks with APP  Ophie Burrowes N, PA-C  4:17 PM

## 2023-12-07 NOTE — Progress Notes (Signed)
 Orders placed for [facility] tomorrow 6/11

## 2023-12-08 ENCOUNTER — Encounter (HOSPITAL_COMMUNITY): Payer: Self-pay | Admitting: Internal Medicine

## 2023-12-08 ENCOUNTER — Encounter (HOSPITAL_COMMUNITY)
Admission: RE | Disposition: A | Discharge status: Home / Self Care | Payer: Self-pay | Source: Home / Self Care | Attending: Cardiovascular Disease

## 2023-12-08 ENCOUNTER — Ambulatory Visit (HOSPITAL_COMMUNITY): Payer: Self-pay | Admitting: Physician Assistant

## 2023-12-08 ENCOUNTER — Other Ambulatory Visit: Payer: Self-pay

## 2023-12-08 ENCOUNTER — Observation Stay (HOSPITAL_COMMUNITY)
Admission: RE | Admit: 2023-12-08 | Discharge: 2023-12-09 | Disposition: A | Discharge status: Home / Self Care | Attending: Cardiovascular Disease | Admitting: Cardiovascular Disease

## 2023-12-08 DIAGNOSIS — Z794 Long term (current) use of insulin: Secondary | ICD-10-CM | POA: Diagnosis not present

## 2023-12-08 DIAGNOSIS — I2510 Atherosclerotic heart disease of native coronary artery without angina pectoris: Secondary | ICD-10-CM

## 2023-12-08 DIAGNOSIS — Z79899 Other long term (current) drug therapy: Secondary | ICD-10-CM | POA: Diagnosis not present

## 2023-12-08 DIAGNOSIS — R079 Chest pain, unspecified: Principal | ICD-10-CM | POA: Insufficient documentation

## 2023-12-08 DIAGNOSIS — I5022 Chronic systolic (congestive) heart failure: Secondary | ICD-10-CM | POA: Diagnosis not present

## 2023-12-08 DIAGNOSIS — E119 Type 2 diabetes mellitus without complications: Secondary | ICD-10-CM | POA: Insufficient documentation

## 2023-12-08 DIAGNOSIS — Z95810 Presence of automatic (implantable) cardiac defibrillator: Secondary | ICD-10-CM | POA: Insufficient documentation

## 2023-12-08 DIAGNOSIS — I472 Ventricular tachycardia, unspecified: Secondary | ICD-10-CM | POA: Diagnosis not present

## 2023-12-08 DIAGNOSIS — Z7901 Long term (current) use of anticoagulants: Secondary | ICD-10-CM | POA: Insufficient documentation

## 2023-12-08 DIAGNOSIS — I480 Paroxysmal atrial fibrillation: Secondary | ICD-10-CM | POA: Insufficient documentation

## 2023-12-08 DIAGNOSIS — I110 Hypertensive heart disease with heart failure: Secondary | ICD-10-CM | POA: Diagnosis not present

## 2023-12-08 DIAGNOSIS — I739 Peripheral vascular disease, unspecified: Secondary | ICD-10-CM | POA: Insufficient documentation

## 2023-12-08 DIAGNOSIS — F17210 Nicotine dependence, cigarettes, uncomplicated: Secondary | ICD-10-CM | POA: Diagnosis not present

## 2023-12-08 DIAGNOSIS — Z8673 Personal history of transient ischemic attack (TIA), and cerebral infarction without residual deficits: Secondary | ICD-10-CM | POA: Insufficient documentation

## 2023-12-08 DIAGNOSIS — Z951 Presence of aortocoronary bypass graft: Secondary | ICD-10-CM | POA: Diagnosis not present

## 2023-12-08 HISTORY — PX: RIGHT/LEFT HEART CATH AND CORONARY/GRAFT ANGIOGRAPHY: CATH118267

## 2023-12-08 LAB — POCT I-STAT EG7
Acid-base deficit: 2 mmol/L (ref 0.0–2.0)
Acid-base deficit: 2 mmol/L (ref 0.0–2.0)
Bicarbonate: 23.9 mmol/L (ref 20.0–28.0)
Bicarbonate: 24.3 mmol/L (ref 20.0–28.0)
Calcium, Ion: 1.2 mmol/L (ref 1.15–1.40)
Calcium, Ion: 1.21 mmol/L (ref 1.15–1.40)
HCT: 46 % (ref 39.0–52.0)
HCT: 47 % (ref 39.0–52.0)
Hemoglobin: 15.6 g/dL (ref 13.0–17.0)
Hemoglobin: 16 g/dL (ref 13.0–17.0)
O2 Saturation: 72 %
O2 Saturation: 77 %
Potassium: 3.9 mmol/L (ref 3.5–5.1)
Potassium: 4 mmol/L (ref 3.5–5.1)
Sodium: 140 mmol/L (ref 135–145)
Sodium: 141 mmol/L (ref 135–145)
TCO2: 25 mmol/L (ref 22–32)
TCO2: 26 mmol/L (ref 22–32)
pCO2, Ven: 45.1 mmHg (ref 44–60)
pCO2, Ven: 46 mmHg (ref 44–60)
pH, Ven: 7.331 (ref 7.25–7.43)
pH, Ven: 7.332 (ref 7.25–7.43)
pO2, Ven: 41 mmHg (ref 32–45)
pO2, Ven: 45 mmHg (ref 32–45)

## 2023-12-08 LAB — POCT I-STAT 7, (LYTES, BLD GAS, ICA,H+H)
Acid-base deficit: 3 mmol/L — ABNORMAL HIGH (ref 0.0–2.0)
Bicarbonate: 21.8 mmol/L (ref 20.0–28.0)
Calcium, Ion: 1.13 mmol/L — ABNORMAL LOW (ref 1.15–1.40)
HCT: 45 % (ref 39.0–52.0)
Hemoglobin: 15.3 g/dL (ref 13.0–17.0)
O2 Saturation: 95 %
Potassium: 3.8 mmol/L (ref 3.5–5.1)
Sodium: 142 mmol/L (ref 135–145)
TCO2: 23 mmol/L (ref 22–32)
pCO2 arterial: 39 mmHg (ref 32–48)
pH, Arterial: 7.355 (ref 7.35–7.45)
pO2, Arterial: 79 mmHg — ABNORMAL LOW (ref 83–108)

## 2023-12-08 LAB — GLUCOSE, CAPILLARY
Glucose-Capillary: 203 mg/dL — ABNORMAL HIGH (ref 70–99)
Glucose-Capillary: 121 mg/dL — ABNORMAL HIGH (ref 70–99)

## 2023-12-08 SURGERY — RIGHT/LEFT HEART CATH AND CORONARY/GRAFT ANGIOGRAPHY
Anesthesia: LOCAL

## 2023-12-08 MED ORDER — FENTANYL CITRATE (PF) 100 MCG/2ML IJ SOLN
INTRAMUSCULAR | Status: AC
Start: 2023-12-08 — End: 2023-12-08
  Filled 2023-12-08: qty 2

## 2023-12-08 MED ORDER — FENTANYL CITRATE (PF) 100 MCG/2ML IJ SOLN
25.0000 ug | Freq: Once | INTRAMUSCULAR | Status: AC
  Administered 2023-12-08: 25 ug via INTRAVENOUS

## 2023-12-08 MED ORDER — MORPHINE SULFATE (PF) 2 MG/ML IV SOLN
2.0000 mg | Freq: Once | INTRAVENOUS | Status: AC
  Administered 2023-12-08: 2 mg via INTRAVENOUS

## 2023-12-08 MED ORDER — TORSEMIDE 20 MG PO TABS
60.0000 mg | ORAL_TABLET | Freq: Every day | ORAL | Status: DC
  Administered 2023-12-09: 60 mg via ORAL
  Filled 2023-12-08: qty 3

## 2023-12-08 MED ORDER — LIDOCAINE HCL (PF) 1 % IJ SOLN
INTRAMUSCULAR | Status: AC
  Filled 2023-12-08: qty 30

## 2023-12-08 MED ORDER — ASPIRIN 81 MG PO CHEW
81.0000 mg | CHEWABLE_TABLET | Freq: Once | ORAL | Status: AC
  Administered 2023-12-08: 81 mg via ORAL
  Filled 2023-12-08: qty 1

## 2023-12-08 MED ORDER — FENTANYL CITRATE (PF) 100 MCG/2ML IJ SOLN
INTRAMUSCULAR | Status: AC
  Filled 2023-12-08: qty 2

## 2023-12-08 MED ORDER — MORPHINE SULFATE (PF) 2 MG/ML IV SOLN
4.0000 mg | INTRAVENOUS | Status: DC | PRN
  Administered 2023-12-08 – 2023-12-09 (×4): 4 mg via INTRAVENOUS
  Filled 2023-12-08 (×4): qty 2

## 2023-12-08 MED ORDER — MORPHINE SULFATE (PF) 2 MG/ML IV SOLN
1.0000 mg | INTRAVENOUS | Status: DC | PRN
  Filled 2023-12-08: qty 1

## 2023-12-08 MED ORDER — HEPARIN (PORCINE) IN NACL 1000-0.9 UT/500ML-% IV SOLN
INTRAVENOUS | Status: DC | PRN
  Administered 2023-12-08: 1500 mL

## 2023-12-08 MED ORDER — DIGOXIN 125 MCG PO TABS
0.1250 mg | ORAL_TABLET | Freq: Every day | ORAL | Status: DC
  Administered 2023-12-09: 0.125 mg via ORAL
  Filled 2023-12-08: qty 1

## 2023-12-08 MED ORDER — LIDOCAINE HCL (PF) 1 % IJ SOLN
INTRAMUSCULAR | Status: DC | PRN
  Administered 2023-12-08 (×2): 2 mL

## 2023-12-08 MED ORDER — HEPARIN SODIUM (PORCINE) 1000 UNIT/ML IJ SOLN
INTRAMUSCULAR | Status: DC | PRN
  Administered 2023-12-08: 5000 [IU] via INTRAVENOUS

## 2023-12-08 MED ORDER — ACETAMINOPHEN 325 MG PO TABS: 650.0000 mg | ORAL_TABLET | ORAL | Status: DC | PRN

## 2023-12-08 MED ORDER — FENTANYL CITRATE (PF) 100 MCG/2ML IJ SOLN
INTRAMUSCULAR | Status: DC | PRN
Start: 2023-12-08 — End: 2023-12-08
  Administered 2023-12-08 (×4): 25 ug via INTRAVENOUS

## 2023-12-08 MED ORDER — EZETIMIBE 10 MG PO TABS
10.0000 mg | ORAL_TABLET | Freq: Every day | ORAL | Status: DC
  Administered 2023-12-09: 10 mg via ORAL
  Filled 2023-12-08: qty 1

## 2023-12-08 MED ORDER — IOHEXOL 350 MG/ML SOLN
INTRAVENOUS | Status: DC | PRN
  Administered 2023-12-08: 145 mL

## 2023-12-08 MED ORDER — HEPARIN SODIUM (PORCINE) 1000 UNIT/ML IJ SOLN
INTRAMUSCULAR | Status: AC
  Filled 2023-12-08: qty 10

## 2023-12-08 MED ORDER — MIDAZOLAM HCL 2 MG/2ML IJ SOLN
INTRAMUSCULAR | Status: AC
Start: 2023-12-08 — End: 2023-12-08
  Filled 2023-12-08: qty 2

## 2023-12-08 MED ORDER — NITROGLYCERIN 0.4 MG SL SUBL: 0.4000 mg | SUBLINGUAL_TABLET | SUBLINGUAL | Status: DC | PRN

## 2023-12-08 MED ORDER — POTASSIUM CHLORIDE CRYS ER 20 MEQ PO TBCR
40.0000 meq | EXTENDED_RELEASE_TABLET | Freq: Every day | ORAL | Status: DC
Start: 2023-12-09 — End: 2023-12-09
  Administered 2023-12-09: 40 meq via ORAL
  Filled 2023-12-08: qty 2

## 2023-12-08 MED ORDER — VERAPAMIL HCL 2.5 MG/ML IV SOLN
INTRAVENOUS | Status: AC
  Filled 2023-12-08: qty 2

## 2023-12-08 MED ORDER — ATORVASTATIN CALCIUM 80 MG PO TABS
80.0000 mg | ORAL_TABLET | Freq: Every day | ORAL | Status: DC
  Administered 2023-12-09: 80 mg via ORAL
  Filled 2023-12-08: qty 1

## 2023-12-08 MED ORDER — MIDAZOLAM HCL 2 MG/2ML IJ SOLN
INTRAMUSCULAR | Status: DC | PRN
  Administered 2023-12-08 (×4): 1 mg via INTRAVENOUS

## 2023-12-08 MED ORDER — VERAPAMIL HCL 2.5 MG/ML IV SOLN
INTRAVENOUS | Status: DC | PRN
  Administered 2023-12-08: 5 mL via INTRA_ARTERIAL

## 2023-12-08 MED ORDER — SPIRONOLACTONE 12.5 MG HALF TABLET
12.5000 mg | ORAL_TABLET | Freq: Every day | ORAL | Status: DC
  Administered 2023-12-09: 12.5 mg via ORAL
  Filled 2023-12-08: qty 1

## 2023-12-08 MED ORDER — SODIUM CHLORIDE 0.9 % IV SOLN: INTRAVENOUS | Status: DC

## 2023-12-08 MED ORDER — SACUBITRIL-VALSARTAN 24-26 MG PO TABS
1.0000 | ORAL_TABLET | Freq: Two times a day (BID) | ORAL | Status: DC
Start: 2023-12-09 — End: 2023-12-09
  Administered 2023-12-09: 1 via ORAL
  Filled 2023-12-08: qty 1

## 2023-12-08 MED ORDER — SOTALOL HCL 80 MG PO TABS
160.0000 mg | ORAL_TABLET | Freq: Two times a day (BID) | ORAL | Status: DC
  Administered 2023-12-08 – 2023-12-09 (×2): 160 mg via ORAL
  Filled 2023-12-08 (×4): qty 2

## 2023-12-08 MED ORDER — ONDANSETRON HCL 4 MG/2ML IJ SOLN: 4.0000 mg | Freq: Four times a day (QID) | INTRAMUSCULAR | Status: DC | PRN

## 2023-12-08 SURGICAL SUPPLY — 19 items
CATH BALLN WEDGE 5F 110CM (CATHETERS) IMPLANT
CATH DIAG 6FR JR4 (CATHETERS) IMPLANT
CATH INFINITI 5 FR IM (CATHETERS) IMPLANT
CATH INFINITI 5FR MULTPACK ANG (CATHETERS) IMPLANT
CATH INFINITI 6F AL1 (CATHETERS) IMPLANT
CATH LAUNCHER 6FR EBU 3 (CATHETERS) IMPLANT
CATH VISTA GUIDE 6FR XB3.5 EPK (CATHETERS) IMPLANT
DEVICE RAD COMP TR BAND LRG (VASCULAR PRODUCTS) IMPLANT
ELECT DEFIB PAD ADLT CADENCE (PAD) IMPLANT
GLIDESHEATH SLEND SS 6F .021 (SHEATH) IMPLANT
KIT ESSENTIALS PG (KITS) IMPLANT
PACK CARDIAC CATHETERIZATION (CUSTOM PROCEDURE TRAY) ×1 IMPLANT
SHEATH GLIDE SLENDER 4/5FR (SHEATH) IMPLANT
SHEATH PROBE COVER 6X72 (BAG) IMPLANT
TRANSDUCER W/STOPCOCK (MISCELLANEOUS) IMPLANT
TUBING ART PRESS 72 MALE/FEM (TUBING) IMPLANT
TUBING CIL FLEX 10 FLL-RA (TUBING) IMPLANT
WIRE EMERALD 3MM-J .035X260CM (WIRE) IMPLANT
WIRE MICROINTRODUCER 60CM (WIRE) IMPLANT

## 2023-12-08 NOTE — Interval H&P Note (Signed)
 History and Physical Interval Note:  12/08/2023 12:49 PM  Michael Barnett  has presented today for surgery, with the diagnosis of chest pain, HF.  The various methods of treatment have been discussed with the patient and family. After consideration of risks, benefits and other options for treatment, the patient has consented to  Procedure(s): RIGHT/LEFT HEART CATH AND CORONARY ANGIOGRAPHY (N/A) as a surgical intervention.  The patient's history has been reviewed, patient examined, no change in status, stable for surgery.  I have reviewed the patient's chart and labs.  Questions were answered to the patient's satisfaction.     Christinna Sprung K Biff Rutigliano

## 2023-12-08 NOTE — Progress Notes (Addendum)
 Paged by nurse that patient continued to complain of significant pain in the wrist. Patient's wrist examined. TR band on, some blood underneath TR band. Overall, forearm is soft without sign of compartment syndrome. Will treat with pain control and continue observation. Given pain, I spoke with Dr. Lorie Rook, will keep patient overnight for obs. Order placed.   Michael Albright PA Pager: [phone]  Patient continued to have wrist pain, seen around 7PM. Complains of numbness in the wrist. Wrist examined, mild swelling, but does not appears to be compartment syndrome, no sign of large hematoma.

## 2023-12-08 NOTE — Progress Notes (Signed)
 Patient stated he took Eliquis  yesterday morning 0830. [facility] notified and will check with Dr. Arlester Ladd.

## 2023-12-08 NOTE — Discharge Instructions (Signed)
 Restart Eliquis  6/12 Evening

## 2023-12-08 NOTE — Progress Notes (Signed)
 On arrival from cath lab, left forearm with swelling noted; Dr Lorie Rook in and Berger, RN in and left forearm elevated on pillows and feels softer

## 2023-12-08 NOTE — Progress Notes (Signed)
 Report called to RN fo;r 3-E-22 and transferred via w/c

## 2023-12-09 ENCOUNTER — Encounter (HOSPITAL_COMMUNITY): Payer: Self-pay | Admitting: Internal Medicine

## 2023-12-09 ENCOUNTER — Other Ambulatory Visit (HOSPITAL_COMMUNITY): Payer: Self-pay

## 2023-12-09 DIAGNOSIS — I2510 Atherosclerotic heart disease of native coronary artery without angina pectoris: Secondary | ICD-10-CM | POA: Diagnosis not present

## 2023-12-09 DIAGNOSIS — I472 Ventricular tachycardia, unspecified: Secondary | ICD-10-CM | POA: Diagnosis not present

## 2023-12-09 DIAGNOSIS — I480 Paroxysmal atrial fibrillation: Secondary | ICD-10-CM | POA: Diagnosis not present

## 2023-12-09 DIAGNOSIS — R079 Chest pain, unspecified: Secondary | ICD-10-CM | POA: Diagnosis not present

## 2023-12-09 MED ORDER — ISOSORBIDE MONONITRATE ER 30 MG PO TB24
30.0000 mg | ORAL_TABLET | Freq: Every day | ORAL | 3 refills | Status: AC
  Filled 2023-12-09: qty 30, 30d supply, fill #0

## 2023-12-09 MED ORDER — ISOSORBIDE MONONITRATE ER 30 MG PO TB24: 30.0000 mg | ORAL_TABLET | Freq: Every day | ORAL | Status: DC

## 2023-12-09 NOTE — TOC CM/SW Note (Signed)
 Transition of Care [facility]) - Inpatient Brief Assessment   Patient Details  Name: Michael Barnett MRN: [id number] Date of Birth: Nov 05, 1970  [facility]) CM/SW Contact:    Jennett Model, RN Phone Number: 12/09/2023, 1:13 PM   Clinical Narrative: From home with cousin, has PCP and insurance on file, states has no HH services in place at this time, has a scale  at home.  States family member will transport them home at [employer] and family is support system, states gets medications from [facility] self ambulatory.  He eats a low sodium diet.    Transition of Care Asessment: Insurance and Status: Insurance coverage has been reviewed Patient has primary care physician: Yes Home environment has been reviewed: home with cousin Prior level of function:: indep Prior/Current Home Services: Current home services (scale) Social Drivers of Health Review: SDOH reviewed no interventions necessary Readmission risk has been reviewed: Yes Transition of care needs: no transition of care needs at this time

## 2023-12-09 NOTE — TOC Transition Note (Signed)
 Transition of Care [facility]) - Discharge Note   Patient Details  Name: Michael Barnett MRN: [id number] Date of Birth: 07/30/1970  Transition of Care [facility]) CM/SW Contact:  Jennett Model, RN Phone Number: 12/09/2023, 1:14 PM   Clinical Narrative:    For dc today, he has transportation at dc. No needs.         Patient Goals and CMS Choice            Discharge Placement                       Discharge Plan and Services Additional resources added to the After Visit Summary for                                       Social Drivers of Health (SDOH) Interventions SDOH Screenings   Food Insecurity: No Food Insecurity (12/08/2023)  Housing: Low Risk  (12/08/2023)  Transportation Needs: No Transportation Needs (12/08/2023)  Utilities: Not At Risk (12/08/2023)  Alcohol Screen: Low Risk  (09/30/2023)  Depression (PHQ2-9): Low Risk  (11/09/2023)  Financial Resource Strain: Low Risk  (09/30/2023)  Tobacco Use: High Risk (12/08/2023)     Readmission Risk Interventions    06/28/2023   10:07 AM  Readmission Risk Prevention Plan  Post Dischage Appt Complete  Medication Screening Complete  Transportation Screening Complete

## 2023-12-09 NOTE — Discharge Summary (Addendum)
 Discharge Summary   Patient ID: Michael Barnett MRN: [id number]; DOB: 28-Jun-1971  Admit date: 12/08/2023 Discharge date: 12/09/2023  PCP:  Lawrance Presume, MD   [facility] Providers Cardiologist:  Kyra Phy, MD  Electrophysiologist:  Efraim Grange, MD      Discharge Diagnoses  Principal Problem:   Chest pain   Diagnostic Studies/Procedures  LHC 12/08/23   Mid LM to Ost LAD lesion is 90% stenosed.   Prox LAD lesion is 95% stenosed.   Ost Cx to Prox Cx lesion is 100% stenosed.   Prox RCA to Mid RCA lesion is 90% stenosed.   Origin to Prox Graft lesion is 100% stenosed.   LIMA and is large.   SVG.   1.  Patent LIMA to LAD; vein graft to obtuse marginal was not imaged and is known occluded. 2.  Relatively unchanged severe native vessel disease. 3.  Fick cardiac output of 7.0 L/min and Fick cardiac index of 3.3 L/min/m with the following hemodynamics:         Right atrial pressure mean of 8 mmHg         Right ventricular pressure 43/3 with an end-diastolic pressure of 12 mmHg         Wedge pressure mean of 12 mmHg         PA pressure 41/18 with a mean of 29 mmHg         Pulsatility index of 2.8         PVR of 2.4 4.  LVEDP 10 mmHg   Recommendation: Relatively compensated cardiac hemodynamics; will try low-dose Imdur for chest pain.  gnostic Dominance: Right  _____________   History of Present Illness   Michael Barnett is a 53 y.o. male with history of chronic systolic CHF/ICM s/p ICD, CAD s/p CABG (LIMA to LAD, SVG to OM) 2013 and PCI to RCA in 2016, VT s/p VT ablation in 05/24 on Sotalol , PAF on eliquis , PAD s/p left fem-pop, OSA, CVA, poorly controlled DM II, recurrent dizziness/presyncope (non-cardiac etiology) who was seen by outpatient advanced heart failure in the on 12/07/23 and sent for RHC/LHC for atypical chest pain. He was admitted overnight for observation due to wrist pain at right radial site.    Hospital Course   Consultants: None    CAD s/p CABG (LIMA to LAD, SVG to OM) in 2013 and PCI to RCA in 2016 Chest pain At outpatient follow up appointment for CHF, patient reported severe left sided chest pain worse with inspiration, not exertion, and associated heaviness, numbness and tinging in left arm. Historically he has presented with atypical symptoms with his previous ACS events. Patient was discussed with Dr. Mitzie Anda and set up for same day cardiac cath.  Troponin (14-> 14-> 18). CRP 0.7. ESR 3. ECG revealed no acute ischemic changes.  RHC/LHC showed patent LIMA to LAD with relatively unchanged severe native disease compared to prior cath in 2024. Will pursue medical management  After the catheterization patient was reporting significant wrist pain, he was examined by Ervin Heath PA-C. Per Dr. Lorie Rook, patient was kept overnight for observation. R  -continue ASA 81mg  -start imdur 30mg   -continue SL nitroglycerin  as needed for pain -continue lipitor  80 mg  -continue zetia  10 mg  Congestive systolic heart failure 2/2 ischemic cardiomyopathy s/p ICD Recent echocardiogram 09/2023 EF 30-35% with regional wall motional abnormalities and moderate asymmetric LVH. Grade III diastolic dysfunction. On admission BNP 417.6 and Dig 0.6 RHC/LHC 12/08/23  Right atrial pressure mean of 8 mmHg            RV pressure 43/3 with an end-diastolic pressure of 12 mmHg            Wedge pressure mean of 12 mmHg            PA pressure 41/18 with a mean of 29 mmHg            Pulsatility index of 2.8            PVR of 2.4            LVEDP 10 mmHg -continue on torsemide  60 mg -continue on jardiance  25 mg -continue on spironolactone  12.5 mg -continue on Toprol  XL 25 mg -continue on Entresto  24/26 BID -continue on digoxin  0.125  -repeat echocardiogram scheduled outpatient with HF outpatient follow-up appointment  Paroxysmal atrial fibrillation In NSR on ECG.  -restart eliquis  5 mg BID, was held for cath while patient was on  heparin  -continue sotalol   Ventricular Tachycardia/PVCs s/p ICD s/p VT ablation at [facility] 10/2022 -continue on sotalol   -followed by EP  Peripheral Artery Disease s/p left fem pop 05/2023-now thrombosed -continue ASA 81 mg -continue lipitor  80 mg -continue zetia  10 mg -followed by vascular    Did the patient have an acute coronary syndrome (MI, NSTEMI, STEMI, etc) this admission?:  No                               Did the patient have a percutaneous coronary intervention (stent / angioplasty)?:  No.    Patient was deemed ready for discharge from a cardiac perspective by Dr. Lorie Rook. Education on medications was provided by pharmacy. No TOC follow up as no intervention was conducted. Will follow up with Advanced Heart Failure on 12/20/23    _____________  Discharge Vitals Blood pressure 134/66, pulse 64, temperature 98 F (36.7 C), temperature source Oral, resp. rate 20, height 5' 9 (1.753 m), weight 97 kg, SpO2 97%.  Filed Weights   12/08/23 1131 12/09/23 0422  Weight: 95.3 kg 97 kg    Labs & Radiologic Studies  CBC Recent Labs    12/06/23 2025 12/07/23 1538 12/08/23 1434 12/08/23 1440 12/08/23 1443  WBC 5.9 6.4  --   --   --   HGB 16.4 16.3   < > 16.0 15.6  HCT 50.2 49.5   < > 47.0 46.0  MCV 93.5 92.5  --   --   --   PLT 134* 120*  --   --   --    < > = values in this interval not displayed.   Basic Metabolic Panel Recent Labs    34/74/25 2025 12/07/23 1538 12/08/23 1434 12/08/23 1440 12/08/23 1443  NA 136 136   < > 141 140  K 4.0 4.3   < > 4.0 3.9  CL 104 104  --   --   --   CO2 22 24  --   --   --   GLUCOSE 208* 258*  --   --   --   BUN 18 19  --   --   --   CREATININE 1.11 1.16  --   --   --   CALCIUM  8.7* 8.8*  --   --   --    < > = values in this interval not displayed.   Liver Function Tests Recent Labs  12/07/23 1538  AST 27  ALT 28  ALKPHOS 110  BILITOT 1.3*  PROT 6.1*  ALBUMIN 3.3*   No results for input(s): LIPASE, AMYLASE in  the last 72 hours. High Sensitivity Troponin:   Recent Labs  Lab 12/06/23 2025 12/06/23 2234 12/07/23 1538  TROPONINIHS 14 14 18*    No results for input(s): TRNPT in the last 720 hours.  BNP Invalid input(s): POCBNP No results for input(s): PROBNP in the last 72 hours.  Recent Labs    12/07/23 1538  BNP 417.6*    D-Dimer No results for input(s): DDIMER in the last 72 hours. Hemoglobin A1C No results for input(s): HGBA1C in the last 72 hours. Fasting Lipid Panel No results for input(s): CHOL, HDL, LDLCALC, TRIG, CHOLHDL, LDLDIRECT in the last 72 hours. Lipoprotein (a)  Date/Time Value Ref Range Status  11/03/2023 01:15 PM 261.4 (H) <75.0 nmol/L Final    Comment:    (NOTE) Note:  Values greater than or equal to 75.0 nmol/L may       indicate an independent risk factor for CHD,       but must be evaluated with caution when applied       to non-Caucasian populations due to the       influence of genetic factors on Lp(a) across       ethnicities. Performed At: [facility] [address] [place], [place] [id number] Pearlean Botts MD NW:[id number]     Thyroid  Function Tests No results for input(s): TSH, T4TOTAL, T3FREE, THYROIDAB in the last 72 hours.  Invalid input(s): FREET3 _____________  CARDIAC CATHETERIZATION Result Date: 12/08/2023   Mid LM to Ost LAD lesion is 90% stenosed.   Prox LAD lesion is 95% stenosed.   Ost Cx to Prox Cx lesion is 100% stenosed.   Prox RCA to Mid RCA lesion is 90% stenosed.   Origin to Prox Graft lesion is 100% stenosed.   LIMA and is large.   SVG. 1.  Patent LIMA to LAD; vein graft to obtuse marginal was not imaged and is known occluded. 2.  Relatively unchanged severe native vessel disease. 3.  Fick cardiac output of 7.0 L/min and Fick cardiac index of 3.3 L/min/m with the following hemodynamics:  Right atrial pressure mean of 8 mmHg  Right ventricular pressure 43/3 with an end-diastolic pressure of 12  mmHg  Wedge pressure mean of 12 mmHg  PA pressure 41/18 with a mean of 29 mmHg  Pulsatility index of 2.8  PVR of 2.4 4.  LVEDP 10 mmHg Recommendation: Relatively compensated cardiac hemodynamics; will try low-dose Imdur for chest pain.    DG Chest Port 1 View Result Date: 12/06/2023 CLINICAL DATA:  CP and dyspnea EXAM: PORTABLE CHEST - 1 VIEW COMPARISON:  October 01, 2023 FINDINGS: Left chest pacemaker/AICD with a single lead terminating in the region of the right ventricle. Sternotomy wires and CABG changes. No focal airspace consolidation, pleural effusion, or pneumothorax. Borderline cardiomegaly. No acute fracture or destructive lesion. Multilevel thoracic osteophytosis. IMPRESSION: No acute cardiopulmonary abnormality. Electronically Signed   By: Rance Burrows M.D.   On: 12/06/2023 20:35   CUP PACEART REMOTE DEVICE CHECK Result Date: 12/01/2023 ICD Scheduled remote reviewed. Normal device function.  Presenting rhythm:  VS, PVC.  1 classified VT with no therapy, EGM morphology consistent with intrinsic rhythm, v. rate 178 bpm.  120 device classified NSVT, available EGMs reviewed on website consistent with atrial driven tachycardia. Next remote 91 days. - CS, CVRS  VAS US  LOWER  EXTREMITY BYPASS GRAFT DUPLEX Result Date: 11/30/2023 LOWER EXTREMITY ARTERIAL DUPLEX STUDY Patient Name:  Michael Barnett  Date of Exam:   11/30/2023 Medical Rec #: [id number]        Accession #:    [id number] Date of Birth: March 30, 1971        Patient Gender: M Patient Age:   50 years Exam Location:  [address] Procedure:      VAS US  LOWER EXTREMITY BYPASS GRAFT DUPLEX Referring Phys: Runell Countryman --------------------------------------------------------------------------------  Indications: Claudication, rest pain, peripheral artery disease, and Patient              reports painful distal thigh pain near incision site. High Risk Factors: Hypertension, hyperlipidemia, current smoker, coronary artery                    disease.   Vascular Interventions: BYPASS GRAFT FEMORAL- ABOVE KNEE POPLITEAL ARTERY (Left)                         06/24/23. Current ABI:            Right ABI 0.65 Left 0.55 Performing Technologist: Homer Lust RVT  Examination Guidelines: A complete evaluation includes B-mode imaging, spectral Doppler, color Doppler, and power Doppler as needed of all accessible portions of each vessel. Bilateral testing is considered an integral part of a complete examination. Limited examinations for reoccurring indications may be performed as noted.   +----------+--------+-----+--------+----------+--------+ LEFT      PSV cm/sRatioStenosisWaveform  Comments +----------+--------+-----+--------+----------+--------+ EIA Distal162                  monophasicbrisk    +----------+--------+-----+--------+----------+--------+ CFA Prox  137                  biphasic           +----------+--------+-----+--------+----------+--------+ POP Mid   23                   monophasicdampened +----------+--------+-----+--------+----------+--------+  Left Graft #1: Femoral to above knee popliteal bypass graft. +--------------------+--------+--------+----------+--------+                     PSV cm/sStenosisWaveform  Comments +--------------------+--------+--------+----------+--------+ Inflow              141             biphasic           +--------------------+--------+--------+----------+--------+ Proximal Anastomosis125             biphasic           +--------------------+--------+--------+----------+--------+ Proximal Graft              occluded                   +--------------------+--------+--------+----------+--------+ Mid Graft                   occluded                   +--------------------+--------+--------+----------+--------+ Distal Graft                                  NV       +--------------------+--------+--------+----------+--------+ Distal Anastomosis                             NV       +--------------------+--------+--------+----------+--------+  Outflow             23              monophasic         +--------------------+--------+--------+----------+--------+   Summary: Left: No color flow or Doppler signal in the proximal to mid graft suggestive of occlusion.  See table(s) above for measurements and observations. Electronically signed by Runell Countryman on 11/30/2023 at 5:38:17 PM.    Final    VAS US  ABI WITH/WO TBI Result Date: 11/30/2023  LOWER EXTREMITY DOPPLER STUDY Patient Name:  Michael Barnett  Date of Exam:   11/30/2023 Medical Rec #: [id number]        Accession #:    [id number] Date of Birth: [birth year]        Patient Gender: M Patient Age:   32 years Exam Location:  [address] Procedure:      VAS US  ABI WITH/WO TBI Referring Phys: Runell Countryman --------------------------------------------------------------------------------  Indications: Rest pain, and peripheral artery disease. High Risk Factors: Hypertension, current smoker, coronary artery disease.  Vascular Interventions: BYPASS GRAFT FEMORAL- ABOVE KNEE POPLITEAL ARTERY (Left)                         06/24/23. Performing Technologist: Homer Lust RVT  Examination Guidelines: A complete evaluation includes at minimum, Doppler waveform signals and systolic blood pressure reading at the level of bilateral brachial, anterior tibial, and posterior tibial arteries, when vessel segments are accessible. Bilateral testing is considered an integral part of a complete examination. Photoelectric Plethysmograph (PPG) waveforms and toe systolic pressure readings are included as required and additional duplex testing as needed. Limited examinations for reoccurring indications may be performed as noted.  ABI Findings: +---------+------------------+-----+----------+--------+ Right    Rt Pressure (mmHg)IndexWaveform  Comment  +---------+------------------+-----+----------+--------+ Brachial 162                                        +---------+------------------+-----+----------+--------+ PTA      105               0.65 monophasic         +---------+------------------+-----+----------+--------+ DP       104               0.64 monophasic         +---------+------------------+-----+----------+--------+ Mordechai April                0.51 Abnormal           +---------+------------------+-----+----------+--------+ +---------+------------------+-----+----------+-------+ Left     Lt Pressure (mmHg)IndexWaveform  Comment +---------+------------------+-----+----------+-------+ Brachial 157                                      +---------+------------------+-----+----------+-------+ PTA      79                0.49 monophasic        +---------+------------------+-----+----------+-------+ DP       89                0.55 monophasic        +---------+------------------+-----+----------+-------+ Great Toe49                0.30 Abnormal          +---------+------------------+-----+----------+-------+ +-------+-----------+-----------+------------+------------+ ABI/TBIToday's ABIToday's TBIPrevious ABIPrevious TBI +-------+-----------+-----------+------------+------------+  Right  0.65       0.51       0.62        0.46         +-------+-----------+-----------+------------+------------+ Left   0.55       0.30       0.67        0.56         +-------+-----------+-----------+------------+------------+  Bilateral ABIs appear essentially unchanged.  Summary: Right: Resting right ankle-brachial index indicates moderate right lower extremity arterial disease. The right toe-brachial index is abnormal. Left: Resting left ankle-brachial index indicates moderate left lower extremity arterial disease. The left toe-brachial index is abnormal. *See table(s) above for measurements and observations.  Electronically signed by Runell Countryman on 11/30/2023 at 5:37:34 PM.    Final     Disposition Pt is being  discharged home today in good condition.  Follow-up Plans & Appointments  Discharge Instructions     Diet - low sodium heart healthy   Complete by: As directed    Discharge instructions   Complete by: As directed    Radial Site Care Refer to this sheet in the next few weeks. These instructions provide you with information on caring for yourself after your procedure. Your caregiver may also give you more specific instructions. Your treatment has been planned according to current medical practices, but problems sometimes occur. Call your caregiver if you have any problems or questions after your procedure. HOME CARE INSTRUCTIONS You may shower the day after the procedure. Remove the bandage (dressing) and gently wash the site with plain soap and water. Gently pat the site dry.  Do not apply powder or lotion to the site.  Do not submerge the affected site in water for 3 to 5 days.  Inspect the site at least twice daily.  Do not flex or bend the affected arm for 24 hours.  No lifting over 5 pounds (2.3 kg) for 5 days after your procedure.  Do not drive home if you are discharged the same day of the procedure. Have someone else drive you.  You may drive 24 hours after the procedure unless otherwise instructed by your caregiver.  What to expect: Any bruising will usually fade within 1 to 2 weeks.  Blood that collects in the tissue (hematoma) may be painful to the touch. It should usually decrease in size and tenderness within 1 to 2 weeks.  SEEK IMMEDIATE MEDICAL CARE IF: You have unusual pain at the radial site.  You have redness, warmth, swelling, or pain at the radial site.  You have drainage (other than a small amount of blood on the dressing).  You have chills.  You have a fever or persistent symptoms for more than 72 hours.  You have a fever and your symptoms suddenly get worse.  Your arm becomes pale, cool, tingly, or numb.  You have heavy bleeding from the site. Hold pressure on the  site.   Increase activity slowly   Complete by: As directed        Discharge Medications Allergies as of 12/09/2023       Reactions   Victoza [liraglutide] Other (See Comments)   Gave pt pancreatitis        Medication List     STOP taking these medications    valsartan  40 MG tablet Commonly known as: DIOVAN        TAKE these medications    albuterol  108 (90 Base) MCG/ACT inhaler Commonly known as: VENTOLIN  HFA Inhale 1-2  puffs into the lungs every 6 (six) hours as needed for wheezing or shortness of breath. What changed: how much to take   albuterol  (2.5 MG/3ML) 0.083% nebulizer solution Commonly known as: PROVENTIL  Take 3 mLs (2.5 mg total) by nebulization every 6 (six) hours as needed for wheezing or shortness of breath. What changed: Another medication with the same name was changed. Make sure you understand how and when to take each.   aspirin  EC 81 MG tablet Take 81 mg by mouth daily. Swallow whole.   atorvastatin  80 MG tablet Commonly known as: LIPITOR  Take 1 tablet (80 mg total) by mouth daily.   budesonide -formoterol  80-4.5 MCG/ACT inhaler Commonly known as: SYMBICORT  Inhale 2 puffs into the lungs 2 (two) times daily. What changed:  when to take this reasons to take this   digoxin  0.125 MG tablet Commonly known as: LANOXIN  Take 1 tablet (0.125 mg total) by mouth daily.   Eliquis  5 MG Tabs tablet Generic drug: apixaban  Take 1 tablet (5 mg total) by mouth 2 (two) times daily.   Entresto  24-26 MG Generic drug: sacubitril -valsartan  Take 1 tablet by mouth 2 (two) times daily.   ezetimibe  10 MG tablet Commonly known as: ZETIA  Take 1 tablet (10 mg total) by mouth daily.   FreeStyle Libre 3 Plus Sensor Misc Change sensor every 15 days.   Furoscix  80 MG/10ML Ctkt Generic drug: Furosemide  Inject 80 mg into the skin as directed. What changed:  when to take this reasons to take this   ibuprofen  600 MG tablet Commonly known as: ADVIL  Take 600  mg by mouth every 6 (six) hours as needed.   isosorbide mononitrate 30 MG 24 hr tablet Commonly known as: IMDUR Take 1 tablet (30 mg total) by mouth at bedtime.   Jardiance  25 MG Tabs tablet Generic drug: empagliflozin  Take 1 tablet (25 mg total) by mouth daily.   Lantus  100 UNIT/ML injection Generic drug: insulin  glargine Inject 18 Units total into the skin 2 (two) times daily.   nitroGLYCERIN  0.4 MG SL tablet Commonly known as: NITROSTAT  Place 0.4 mg under the tongue every 5 (five) minutes as needed for chest pain.   pantoprazole  40 MG tablet Commonly known as: PROTONIX  Take 1 tablet (40 mg total) by mouth daily.   potassium chloride  SA 20 MEQ tablet Commonly known as: KLOR-CON  M Take 2 tablets (40 mEq total) by mouth daily.   sotalol  160 MG tablet Commonly known as: BETAPACE  Take 1 tablet (160 mg total) by mouth 2 (two) times daily.   spironolactone  25 MG tablet Commonly known as: ALDACTONE  Take 0.5 tablets (12.5 mg total) by mouth daily.   torsemide  20 MG tablet Commonly known as: DEMADEX  Take 3 tablets (60 mg total) by mouth daily. What changed: how much to take   True Metrix Blood Glucose Test test strip Generic drug: glucose blood Use as instructed          Outstanding Labs/Studies None  Duration of Discharge Encounter: APP Time: 25 minutes   Signed, Mabel Savage, PA-C 12/09/2023, 1:05 PM  ATTENDING ATTESTATION:  After conducting a review of all available clinical information with the care team, interviewing the patient, and performing a physical exam, I agree with the findings and plan described in this note.   GEN: No acute distress.   HEENT:  MMM, no JVD, no scleral icterus Cardiac: RRR, no murmurs, rubs, or gallops.  Respiratory: Clear to auscultation bilaterally. GI: Soft, nontender, non-distended  MS: No edema; No deformity. Neuro:  Nonfocal  Vasc:  +2 radial pulses; no hematoma forearm  Patient doing well after diagnostic  coronary angiography and right heart catheterization yesterday.  He developed a limited in-transit hematoma but had a lot of access site pain.  For this reason he was observed overnight.  This morning his left forearm is reassuring.  His pain is well-controlled.  Will discharge with new medication of Imdur 30 mg to help with chest pain syndrome.  APP discharge time:25 MD discharge time:30  Alyssa Backbone, MD Pager [phone]

## 2023-12-09 NOTE — Addendum Note (Signed)
 Encounter addended by: Coal Nearhood B, RN on: 12/09/2023 10:49 AM  Actions taken: Order list changed, Diagnosis association updated

## 2023-12-13 ENCOUNTER — Telehealth: Payer: Self-pay

## 2023-12-13 NOTE — Transitions of Care (Post Inpatient/ED Visit) (Signed)
   12/13/2023  Name: Ranger Petrich MRN: [id number] DOB: [birth year]  Today's TOC FU Call Status: Today's TOC FU Call Status:: Unsuccessful Call (1st Attempt) Unsuccessful Call (1st Attempt) Date: 12/13/23  Attempted to reach the patient regarding the most recent Inpatient/ED visit.  Follow Up Plan: Additional outreach attempts will be made to reach the patient to complete the Transitions of Care (Post Inpatient/ED visit) call.   Signature  Burnett Carson, RN

## 2023-12-14 ENCOUNTER — Telehealth: Payer: Self-pay

## 2023-12-14 NOTE — Telephone Encounter (Signed)
 While completing TOC call , I reminded the patient that since he is now living in [place] he is outside of the [facility] service area and he needs to contact DSS of his change in address and [facility]  He said is aware

## 2023-12-14 NOTE — Telephone Encounter (Signed)
 From [facility] call:  He stated he is feeling fine except for feeling light headed and I  instructed him to contact cardiology.    He has an appointment with Dr Lincoln Renshaw but not until 03/10/2024 and he did not want to schedule an appointment to be seen sooner.

## 2023-12-14 NOTE — Transitions of Care (Post Inpatient/ED Visit) (Signed)
   12/14/2023  Name: Michael Barnett MRN: [id number] DOB: Jan 09, 1971  Today's TOC FU Call Status: Today's TOC FU Call Status:: Successful TOC FU Call Completed Unsuccessful Call (1st Attempt) Date: 12/13/23 [facility] FU Call Complete Date: 12/14/23 Patient's Name and Date of Birth confirmed.  Transition Care Management Follow-up Telephone Call Date of Discharge: 12/09/23 Discharge Facility: [facility] Type of Discharge: Inpatient Admission Primary Inpatient Discharge Diagnosis:: chest pain How have you been since you were released from the hospital?: Better (He stated he is feeling fine except for feeling light headed.) Any questions or concerns?: Yes Patient Questions/Concerns:: He is concerned about being lightheaded Patient Questions/Concerns Addressed: Notified Provider of Patient Questions/Concerns (I instructed him to call cardiology)  Items Reviewed: Did you receive and understand the discharge instructions provided?: Yes Medications obtained,verified, and reconciled?: No Medications Not Reviewed Reasons:: Other: (He said he has all of his medications as well as a nebulizer and Freestyle CGM and he did not have any questions about the medications and did not need to review the med list.) Any new allergies since your discharge?: No Dietary orders reviewed?: Yes Type of Diet Ordered:: heart healthy, low sodium  diabetic Do you have support at home?: Yes Name of Support/Comfort Primary Source: he did not specify who  Medications Reviewed Today: Medications Reviewed Today   Medications were not reviewed in this encounter     Home Care and Equipment/Supplies: Were Home Health Services Ordered?: No Any new equipment or medical supplies ordered?: No  Functional Questionnaire: Do you need assistance with bathing/showering or dressing?: No Do you need assistance with meal preparation?: No Do you need assistance with eating?: No Do you have difficulty maintaining continence:  No Do you need assistance with getting out of bed/getting out of a chair/moving?: No Do you have difficulty managing or taking your medications?: No  Follow up appointments reviewed: PCP Follow-up appointment confirmed?: Yes Date of PCP follow-up appointment?: 12/09/23 Follow-up Provider: Dr Lincoln Renshaw.  I offered to schedule an appointment for him to be seen sooner but he said he didn't want to change this appointment Specialist Hospital Follow-up appointment confirmed?: Yes Date of Specialist follow-up appointment?: 12/20/23 Follow-Up Specialty Provider:: [facility]- heart failure clini, Do you need transportation to your follow-up appointment?: No Do you understand care options if your condition(s) worsen?: Yes-patient verbalized understanding    SIGNATURE  Burnett Carson, RN

## 2023-12-15 ENCOUNTER — Encounter

## 2023-12-17 ENCOUNTER — Emergency Department (HOSPITAL_COMMUNITY)

## 2023-12-17 ENCOUNTER — Telehealth (HOSPITAL_COMMUNITY): Payer: Self-pay | Admitting: Cardiology

## 2023-12-17 ENCOUNTER — Other Ambulatory Visit: Payer: Self-pay

## 2023-12-17 ENCOUNTER — Emergency Department (HOSPITAL_COMMUNITY)
Admission: EM | Admit: 2023-12-17 | Discharge: 2023-12-17 | Disposition: A | Discharge status: Home / Self Care | Attending: Emergency Medicine | Admitting: Emergency Medicine

## 2023-12-17 ENCOUNTER — Encounter (HOSPITAL_COMMUNITY): Payer: Self-pay

## 2023-12-17 DIAGNOSIS — R200 Anesthesia of skin: Secondary | ICD-10-CM | POA: Diagnosis not present

## 2023-12-17 DIAGNOSIS — M79602 Pain in left arm: Secondary | ICD-10-CM | POA: Diagnosis present

## 2023-12-17 DIAGNOSIS — M25532 Pain in left wrist: Secondary | ICD-10-CM | POA: Diagnosis not present

## 2023-12-17 DIAGNOSIS — R202 Paresthesia of skin: Secondary | ICD-10-CM | POA: Insufficient documentation

## 2023-12-17 DIAGNOSIS — I2510 Atherosclerotic heart disease of native coronary artery without angina pectoris: Secondary | ICD-10-CM | POA: Diagnosis not present

## 2023-12-17 LAB — CBC
HCT: 50.3 % (ref 39.0–52.0)
Hemoglobin: 16.4 g/dL (ref 13.0–17.0)
MCH: 30.8 pg (ref 26.0–34.0)
MCHC: 32.6 g/dL (ref 30.0–36.0)
MCV: 94.4 fL (ref 80.0–100.0)
Platelets: 181 10*3/uL (ref 150–400)
RBC: 5.33 MIL/uL (ref 4.22–5.81)
RDW: 14.1 % (ref 11.5–15.5)
WBC: 7.7 10*3/uL (ref 4.0–10.5)
nRBC: 0 % (ref 0.0–0.2)

## 2023-12-17 LAB — BASIC METABOLIC PANEL WITH GFR
Anion gap: 13 (ref 5–15)
BUN: 25 mg/dL — ABNORMAL HIGH (ref 6–20)
CO2: 23 mmol/L (ref 22–32)
Calcium: 8.8 mg/dL — ABNORMAL LOW (ref 8.9–10.3)
Chloride: 101 mmol/L (ref 98–111)
Creatinine, Ser: 1.52 mg/dL — ABNORMAL HIGH (ref 0.61–1.24)
GFR, Estimated: 54 mL/min — ABNORMAL LOW (ref >60)
Glucose, Bld: 293 mg/dL — ABNORMAL HIGH (ref 70–99)
Potassium: 3.8 mmol/L (ref 3.5–5.1)
Sodium: 137 mmol/L (ref 135–145)

## 2023-12-17 LAB — TROPONIN I (HIGH SENSITIVITY)
Troponin I (High Sensitivity): 16 ng/L (ref <18)
Troponin I (High Sensitivity): 15 ng/L (ref <18)

## 2023-12-17 MED ORDER — IOHEXOL 350 MG/ML SOLN
100.0000 mL | Freq: Once | INTRAVENOUS | Status: AC | PRN
  Administered 2023-12-17: 100 mL via INTRAVENOUS

## 2023-12-17 MED ORDER — ACETAMINOPHEN 500 MG PO TABS
1000.0000 mg | ORAL_TABLET | Freq: Once | ORAL | Status: AC
  Administered 2023-12-17: 1000 mg via ORAL
  Filled 2023-12-17: qty 2

## 2023-12-17 MED ORDER — LACTATED RINGERS IV BOLUS
500.0000 mL | Freq: Once | INTRAVENOUS | Status: AC
  Administered 2023-12-17: 500 mL via INTRAVENOUS

## 2023-12-17 NOTE — ED Notes (Addendum)
 Unable to palpate radial pulse, rate w/doppler, 62bpm.

## 2023-12-17 NOTE — Telephone Encounter (Signed)
 Patient called to report discomfort after cath  Reports arm is swollen and painful, reports arms is hot to touch, hand aches and hurts, reports he feels as if he is losing feeling in his hand  Pain has increased over the past 2-3 nights  Reports compliance with post cath instructions (No heavy lifting pushing or pulling)   No OTC pain reliever has been used  Please advise

## 2023-12-17 NOTE — ED Triage Notes (Signed)
 Pt states he had a heart cath through left arm approximately a week and a half ago and approximately 5 days ago, left arm has becoming increasingly painful and numb. Pt states he has increasing pain in left wrist. Pt states he has since been lightheaded. Pt states he takes blood thinners and the procedure had to be stopped d/t him bleeding out.

## 2023-12-17 NOTE — Telephone Encounter (Signed)
 Message to triage with CVD   Pt aware

## 2023-12-17 NOTE — ED Notes (Signed)
 Pt to CT at this time.

## 2023-12-17 NOTE — ED Provider Triage Note (Signed)
 Emergency Medicine Provider Triage Evaluation Note  Michael Barnett , a 53 y.o. male  was evaluated in triage.  Pt complains of left wrist pain/swelling/numbness/tingling.  Patient underwent heart catheterization about 10 days ago, site of catheterization was left wrist and patient complains of left wrist pain/swelling with numbness and tingling to the ventral and dorsal aspects of his left hand.  Reports area feeling hot at times.  He has also had intermittent anterior chest pain with occasional SOB as well as intermittent lightheadedness/dizziness.   Review of Systems  Positive: As above Negative: As above  Physical Exam  BP (!) 92/53   Pulse 64   Temp 97.6 F (36.4 C)   Resp 16   Ht 5' 9 (1.753 m)   Wt 97.5 kg   SpO2 98%   BMI 31.75 kg/m  Gen:   Awake, no distress   Resp:  Normal effort  MSK:   Moves extremities without difficulty  Other:  Left upper extremity: Difficulty palpating radial pulse but was found with doppler, 2+ pulse at ulnar artery, full range of motion of left wrist/hand, mild swelling of left wrist, numbness/tingling of dorsum/palmar aspect of hand  Medical Decision Making  Medically screening exam initiated at 2:57 PM.  Appropriate orders placed.  Michael Barnett was informed that the remainder of the evaluation will be completed by another provider, this initial triage assessment does not replace that evaluation, and the importance of remaining in the ED until their evaluation is complete.     Michael Barnett, [place] 12/17/23 [phone]

## 2023-12-17 NOTE — ED Provider Notes (Signed)
 [facility] Provider Note   CSN: [id number] Arrival date & time: 12/17/23  1431     History Chief Complaint  Patient presents with   Arm Pain    Michael Barnett is a 53 y.o. male w/ PMHx PAD, VT, CAD who presents to the ED for evaluation of left wrist pain.  Patient states he had cardiac cath 6/11.  Patient states he has had intermittent numbness tingling and pain to left wrist.  Patient is concerned there was a complication as they did not discontinue his blood thinners prior to catheterization.  Patient has not taken anything for pain.  He is not elevating arm.  He has not iced arm.    Physical Exam Updated Vital Signs BP 104/82   Pulse 70   Temp 98.1 F (36.7 C) (Oral)   Resp 17   Ht 5' 9 (1.753 m)   Wt 97.5 kg   SpO2 99%   BMI 31.75 kg/m  Physical Exam Vitals and nursing note reviewed.  Constitutional:      General: He is not in acute distress.    Appearance: He is well-developed.  HENT:     Head: Normocephalic and atraumatic.   Eyes:     Conjunctiva/sclera: Conjunctivae normal.    Cardiovascular:     Rate and Rhythm: Normal rate and regular rhythm.     Heart sounds: No murmur heard. Pulmonary:     Effort: Pulmonary effort is normal. No respiratory distress.     Breath sounds: Normal breath sounds.  Abdominal:     Palpations: Abdomen is soft.     Tenderness: There is no abdominal tenderness.   Musculoskeletal:        General: No swelling.     Cervical back: Neck supple.     Comments: Full range of motion of the left wrist on the hand.  Neurovascular intact.  No obvious deformity injury.  No focal tenderness.  All compartments are soft.   Skin:    General: Skin is warm and dry.     Capillary Refill: Capillary refill takes less than 2 seconds.   Neurological:     General: No focal deficit present.     Mental Status: He is alert.   Psychiatric:        Mood and Affect: Mood normal.     ED Results /  Procedures / Treatments   Labs (all labs ordered are listed, but only abnormal results are displayed) Labs Reviewed  BASIC METABOLIC PANEL WITH GFR - Abnormal; Notable for the following components:      Result Value   Glucose, Bld 293 (*)    BUN 25 (*)    Creatinine, Ser 1.52 (*)    Calcium  8.8 (*)    GFR, Estimated 54 (*)    All other components within normal limits  CBC  TROPONIN I (HIGH SENSITIVITY)  TROPONIN I (HIGH SENSITIVITY)    EKG EKG Interpretation Date/Time:  Friday December 17 2023 14:58:54 EDT Ventricular Rate:  65 PR Interval:  138 QRS Duration:  114 QT Interval:  450 QTC Calculation: 468 R Axis:   10  Text Interpretation: Normal sinus rhythm Incomplete left bundle branch block T wave abnormality, consider inferior ischemia Prolonged QT  no significant change since Dec 09 2023 Confirmed by Freddi Hamilton [phone]) on 12/17/2023 4:09:11 PM  Radiology CT ANGIO UP EXTREM LEFT W &/OR WO CONTAST Result Date: 12/17/2023 CLINICAL DATA:  Diminished radial pulse. Swelling at the wrist. Head  are catheterization in left arm 1 week ago. EXAM: CT ANGIOGRAPHY UPPER LEFT EXTREMITY TECHNIQUE: Multidetector CT angiography of the left forearm was performed after the menstruation of intravenous contrast. Coronal sagittal reformats were submitted. CONTRAST:  OMNIPAQUE  IOHEXOL  350 MG/ML SOLN COMPARISON:  CT angiogram left upper extremity same day FINDINGS: The left forearm was imaged. The brachial artery, radial artery and ulnar artery appear patent to the level of the wrist. There is no evidence for thrombosis, aneurysm, dissection or significant stenosis. There is no significant soft tissue abnormality. There is no fluid collection or soft tissue gas no elbow joint effusion identified. Osseous structures are within normal limits. Review of the MIP images confirms the above findings. IMPRESSION: No acute abnormality identified in the left forearm. No evidence for arterial occlusion or  significant stenosis. Electronically Signed   By: Greig Pique M.D.   On: 12/17/2023 22:36   CT ANGIO UP EXTREM LEFT W &/OR WO CONTAST Result Date: 12/17/2023 CLINICAL DATA:  Heart catheterization via left upper extremity approach 1.5 weeks ago, pain and numbness in left upper extremity for 5 days, lightheadedness EXAM: CT ANGIOGRAPHY UPPER LEFT EXTREMITY TECHNIQUE: Multidetector CT angiography of the upper left extremity was performed according to the standard protocol following intravenous contrast administration. RADIATION DOSE REDUCTION: This exam was performed according to the departmental dose-optimization program which includes automated exposure control, adjustment of the mA and/or kV according to patient size and/or use of iterative reconstruction technique. CONTRAST:  OMNIPAQUE  IOHEXOL  350 MG/ML SOLN COMPARISON:  None Available. FINDINGS: There was infiltration of the IV during the injection of contrast, with suboptimal opacification of the arterial structures of the left upper extremity. The IV catheter was removed by the technologist, and a warm compress was applied and the ordering provider was notified by the CT technologist. Vascular There is suboptimal opacification of the arterial structures of the left upper extremity, severely limiting the evaluation. Grossly normal caliber of the visualized portions of the aorta, left subclavian artery, left axillary artery, left brachial artery. Left upper extremity venous structures appear normal in caliber as well. Assessment of the vascular lumen as well as evaluation of the distal vessels of the left forearm cannot be performed due to timing of contrast bolus and IV infiltration. There is atherosclerosis of the aortic arch and left subclavian artery. Bones/Joint/Cartilage There are no acute or destructive bony abnormalities identified. Alignment of the shoulder, elbow, and wrist appears anatomic. Visualized portions of the left thoracic cage are  unremarkable. Prior median sternotomy. Ligaments Suboptimally assessed by CT. Muscles and Tendons No acute findings. Soft tissues No evidence of inflammatory change or fluid collection. Visualized portions of the left chest are clear. Pacemaker is seen within the left chest wall, distal aspect of the lead excluded by slice selection. Assessment of the soft tissues is limited due to the infiltration of the contrast dose. Review of the MIP images confirms the above findings. IMPRESSION: 1. Essentially nondiagnostic evaluation of the vascular structures of the left upper extremity due to infiltration of the contrast injection. The ordering provider was notified by the CT technologist per protocol. 2. Minimal atherosclerosis of the aorta and left subclavian artery. 3. No acute findings within the left upper extremity. Electronically Signed   By: Ozell Daring M.D.   On: 12/17/2023 19:36   DG Chest 2 View Result Date: 12/17/2023 CLINICAL DATA:  Chest pain and shortness of breath since heart catheterization procedure approximately 1 week ago. EXAM: CHEST - 2 VIEW COMPARISON:  AP  chest 12/06/2023 FINDINGS: Left chest wall AICD lead again overlies the right ventricle. Status post median sternotomy, unchanged. Cardiac silhouette and mediastinal contours are unchanged and within normal limits. The lungs are clear. No pleural effusion or pneumothorax. Bridging ossification of the anterior and right aspects of the mid and lower thoracic spine with relative preservation of disc spaces, compatible with diffuse idiopathic skeletal hyperostosis. IMPRESSION: No active cardiopulmonary disease. Electronically Signed   By: Tanda Lyons M.D.   On: 12/17/2023 17:09    Medications Ordered in ED Medications  lactated ringers  bolus 500 mL (0 mLs Intravenous Stopped 12/17/23 2144)  acetaminophen  (TYLENOL ) tablet 1,000 mg (1,000 mg Oral Given 12/17/23 1717)  iohexol  (OMNIPAQUE ) 350 MG/ML injection 100 mL (100 mLs Intravenous  Contrast Given 12/17/23 1727)  iohexol  (OMNIPAQUE ) 350 MG/ML injection 100 mL (100 mLs Intravenous Contrast Given 12/17/23 2224)    ED Course/ Medical Decision Making/ A&P  Ronav Furney is a 53 y.o. male presents as detailed above  Differential ddx: Arterial injury, postoperative swelling, postoperative pain,  On arrival, patient afebrile hemodynamically stable no hypoxia or respiratory distress.  ED Work-up: Please see details of labs and imaging listed above. Plan for CTA left upper extremity.  IV infiltrated on right side.  Mild swelling to right arm.  New IV placed.  CTA without acute vascular abnormality.  Bilateral upper extremities remain with soft compartments.  Patient noted to have mild increase in creatinine 1.52.  Patient received 500 cc bolus blood pressure improved.  Patient tolerating p.o. and drink lots of fluid while in ED. Overall no evidence of acute life-threatening illness or injury.  Advised to continue Tylenol  ice and elevation of extremities.  Return precautions provided.  Advised to follow-up PCP.  Patient voiced that she agrees with plan   Overall impression postprocedural pain Patient stable for discharge and outpatient follow-up. Strict return precautions provided. Patient voices understanding and agrees with plan.   Patient seen with supervising physician who agrees with plan.  Final Clinical Impression(s) / ED Diagnoses Final diagnoses:  Left arm pain      Waddell Seats, DO PGY-3 Emergency Medicine    Seats Waddell, DO 12/17/23 [id number]    Freddi Hamilton, MD 12/18/23 [phone]

## 2023-12-17 NOTE — Telephone Encounter (Signed)
 Returned call to patient. He had a right and left heart cath performed on 12/08/23. He reports his wrist and hand are hurting, hot to touch, swelling and he is losing feeling in his hand. This has gotten progressively worse over the last 2-3 days.  No drainage from cath site, but with swelling, lose of feeling in hand/wrist, swelling and increasing pain advised patient to go to ED for evaluation. Patient verbalized understanding.

## 2023-12-17 NOTE — ED Notes (Signed)
 PT D/C'D AFTER INSTRUCTIONS REVIEWED. PT VERBALIZED UNDERSTANDING. NAD REPORTED OR NOTED AT THIS TIME.

## 2023-12-17 NOTE — Discharge Instructions (Addendum)
 Please continue Tylenol  500 mg every 4 hours.  Please continue icing and elevating arm multiple times a day.  Please also ice right arm.  Please follow-up with your primary care doctor in the next week or 2. Thank you for allowing us  to take care of you today.  We hope you begin feeling better soon.   To-Do:  Please follow-up with your primary doctor within the next 2-3 days. Please return to the Emergency Department or call 911 if you experience chest pain, shortness of breath, severe pain, severe fever, altered mental status, or have any reason to think that you need emergency medical care.  Thank you again.  Hope you feel better soon.  [facility] of [facility] Medicine

## 2023-12-17 NOTE — Telephone Encounter (Signed)
 Dr Lorie Rook performed the heart catherization.  Please contact his office.   Asim Gersten NP-C  1:05 PM

## 2023-12-20 ENCOUNTER — Ambulatory Visit (HOSPITAL_COMMUNITY)
Admission: RE | Admit: 2023-12-20 | Discharge: 2023-12-20 | Disposition: A | Discharge status: Home / Self Care | Source: Ambulatory Visit | Attending: Internal Medicine | Admitting: Internal Medicine

## 2023-12-20 ENCOUNTER — Ambulatory Visit (HOSPITAL_BASED_OUTPATIENT_CLINIC_OR_DEPARTMENT_OTHER)
Admission: RE | Admit: 2023-12-20 | Discharge: 2023-12-20 | Disposition: A | Discharge status: Home / Self Care | Source: Ambulatory Visit | Attending: Physician Assistant | Admitting: Physician Assistant

## 2023-12-20 VITALS — BP 119/71 | HR 62 | Wt 213.0 lb

## 2023-12-20 DIAGNOSIS — I2510 Atherosclerotic heart disease of native coronary artery without angina pectoris: Secondary | ICD-10-CM

## 2023-12-20 DIAGNOSIS — Z7901 Long term (current) use of anticoagulants: Secondary | ICD-10-CM | POA: Insufficient documentation

## 2023-12-20 DIAGNOSIS — E1151 Type 2 diabetes mellitus with diabetic peripheral angiopathy without gangrene: Secondary | ICD-10-CM | POA: Diagnosis not present

## 2023-12-20 DIAGNOSIS — I5022 Chronic systolic (congestive) heart failure: Secondary | ICD-10-CM | POA: Diagnosis present

## 2023-12-20 DIAGNOSIS — I472 Ventricular tachycardia, unspecified: Secondary | ICD-10-CM | POA: Diagnosis not present

## 2023-12-20 DIAGNOSIS — I358 Other nonrheumatic aortic valve disorders: Secondary | ICD-10-CM | POA: Insufficient documentation

## 2023-12-20 DIAGNOSIS — Z7984 Long term (current) use of oral hypoglycemic drugs: Secondary | ICD-10-CM | POA: Insufficient documentation

## 2023-12-20 DIAGNOSIS — R079 Chest pain, unspecified: Secondary | ICD-10-CM | POA: Diagnosis not present

## 2023-12-20 DIAGNOSIS — I255 Ischemic cardiomyopathy: Secondary | ICD-10-CM

## 2023-12-20 DIAGNOSIS — I493 Ventricular premature depolarization: Secondary | ICD-10-CM | POA: Diagnosis not present

## 2023-12-20 DIAGNOSIS — Z951 Presence of aortocoronary bypass graft: Secondary | ICD-10-CM | POA: Diagnosis not present

## 2023-12-20 DIAGNOSIS — I480 Paroxysmal atrial fibrillation: Secondary | ICD-10-CM | POA: Diagnosis not present

## 2023-12-20 DIAGNOSIS — G4733 Obstructive sleep apnea (adult) (pediatric): Secondary | ICD-10-CM | POA: Insufficient documentation

## 2023-12-20 DIAGNOSIS — I110 Hypertensive heart disease with heart failure: Secondary | ICD-10-CM | POA: Insufficient documentation

## 2023-12-20 DIAGNOSIS — Z95810 Presence of automatic (implantable) cardiac defibrillator: Secondary | ICD-10-CM | POA: Insufficient documentation

## 2023-12-20 DIAGNOSIS — Z8673 Personal history of transient ischemic attack (TIA), and cerebral infarction without residual deficits: Secondary | ICD-10-CM | POA: Insufficient documentation

## 2023-12-20 DIAGNOSIS — Z79899 Other long term (current) drug therapy: Secondary | ICD-10-CM | POA: Diagnosis not present

## 2023-12-20 DIAGNOSIS — M25552 Pain in left hip: Secondary | ICD-10-CM | POA: Insufficient documentation

## 2023-12-20 DIAGNOSIS — F17210 Nicotine dependence, cigarettes, uncomplicated: Secondary | ICD-10-CM | POA: Insufficient documentation

## 2023-12-20 LAB — ECHOCARDIOGRAM COMPLETE
AR max vel: 2.72 cm2
AV Area VTI: 2.43 cm2
AV Area mean vel: 2.33 cm2
AV Mean grad: 4.2 mmHg
AV Peak grad: 7 mmHg
Ao pk vel: 1.33 m/s
Area-P 1/2: 3.01 cm2
S' Lateral: 4.6 cm

## 2023-12-20 NOTE — Patient Instructions (Addendum)
 Medication Changes:  No Changes In Medications at this time.   Lab Work:  PLEASE RETURN FOR LABS AS SCHEDULED IN 1 WEEK    CPX TESTING AS SCHEDULED   Expect to be in the lab for 2 hours. Please plan to arrive 30 minutes prior to your appointment. You may be asked to reschedule your test if you arrive 20 minutes or more after your scheduled appointment time.  Main Campus address: [address] [place], [place] [address] You may arrive to the Main Entrance A or Entrance C (free valet parking is available at both). -Main Entrance A (on [address]) :proceed to admitting for check in -Entrance C (on [facility]): proceed to [employer] parking or under hospital deck parking using this code _________  Check In: [facility] waiting room (1st floor)   General Instructions for the day of the test (Please follow all instructions from your physician): Refrain from ingesting a heavy meal, alcohol, or caffeine or using tobacco products within 2 hours of the test (DO NOT FAST for mare than 8 hours). You may have all other non-alcoholic, non -caffeinated beverage,a light snack (crackers,a piece of fruit, carrot sticks, toast bagel,etc) up to your appointment. Avoid significant exertion or exercise within 24 hours of your test. Be prepared to exercise and sweat. Your clothing should permit freedom of movement and include walking or running shoes. Women bring loose fitting short sleeved blouse.  This evaluation may be fatiguing and you may wish ti have someone accompany you to the assessment to drive you home afterward. Bring a list of your medications with you, including dosage and frequency you take the medications (  I.e.,once per day, twice per day, etc). Take all medications as prescribed, unless noted below or instructed to do so by your physician.  Please do not take the following medications prior to your  CPX:  _________________________________________________  _________________________________________________  Brief description of the test: A brief lung test will be performed. This will involve you taking deep breaths and blowing hard and fast through your mouth. During these , a clip will be on your nose and you will be breathing through a breathing device.   For the exercise portion of the test you will be walking on a treadmill, or riding a stationary bike, to your maximal effor or until symptoms such as chest pain, shortness of breath, leg pain or dizziness limit your exercise. You will be breathing in and out of a breathing device through your mouth (a clip will be on your nose again). Your heart rate, ECG, blood pressure, oxygen saturations, breathing rate and depth, amount of oxygen you consume and amount of carbon dioxide you produce will be measured and monitored throughout the exercise test.  If you need to cancel or reschedule your appointment please call [phone] If you have further questions please call your physician   Follow-Up in: 2 MONTHS AS SCHEDULED WITH APP   At the [facility], you and your health needs are our priority. We have a designated team specialized in the treatment of Heart Failure. This Care Team includes your primary Heart Failure Specialized Cardiologist (physician), Advanced Practice Providers (APPs- Physician Assistants and Nurse Practitioners), and Pharmacist who all work together to provide you with the care you need, when you need it.   You may see any of the following providers on your designated Care Team at your next follow up:  Dr. Toribio Fuel Dr. Ezra Shuck Dr. Ria Commander Dr.  Odis Brownie Greig Mosses, NP Caffie Shed, [place] [facility], [place] Beckey Coe, NP [place] Lee, NP Tinnie Redman, PharmD   Please be sure to bring in all your medications bottles to every appointment.   Need to Contact  Us :  If you have any questions or concerns before your next appointment please send us  a message through [facility] or call our office at [phone].    TO LEAVE A MESSAGE FOR THE NURSE SELECT OPTION 2, PLEASE LEAVE A MESSAGE INCLUDING: YOUR NAME DATE OF BIRTH CALL BACK NUMBER REASON FOR CALL**this is important as we prioritize the call backs  YOU WILL RECEIVE A CALL BACK THE SAME DAY AS LONG AS YOU CALL BEFORE 4:00 PM

## 2023-12-20 NOTE — Progress Notes (Addendum)
 ADVANCED HF CLINIC NOTE   PCP: Vicci Barnie NOVAK, MD  HF Cardiologist: Dr. Cherrie  HPI: Michael Barnett is a 53 y.o. male history of chronic systolic CHF/ICM s/p ICD, CAD s/p CABG (LIMA to LAD, SVG to OM) 2013 and PCI to RCA in 2016, VT s/p VT ablation in 05/24 on Sotalol , PAF, PAD s/p left fem-pop, OSA, CVA, poorly controlled DM II, recurrent dizziness/presyncope felt to be noncardiac. Has history of chronic atypical chest pain.  Previously followed by Cardiology at [facility]. Had been followed by Advanced Heart Failure at OSH in past. Underwent evaluation for transplant but later declined d/t noncompliance. Established with Dr. Wendel 12/24.   EF previously 25% 09/22. EF improved to 40-45% on echo 05/24.   R/LHC 12/24: occluded SVG to OM, patent LIMA to LAD, LIMA backfills the p LAD and its branches, also supplies collaterals to LCX, occluded p Cx, severe RCA disease, elevated filling pressures with preserved Fick CO. Echo 12/24: EF 45-50%, grade III DD, RV okay  Admitted 1/25 following ICD shock. On device interrogation rhythm appeared to be Afib with RVR >> inappropriate shock.  Admitted 09/28/23 with acute on chronic CHF and chest pain.  Echo with EF 30-35%, RWMA, grade III DD. Cardiology consulted. He was diuresed and GDMT titrated.   Struggling with volume overload and medication non-compliance since discharge.   Seen for follow-up 06/10. Noting episodes of chest pain reminiscent of prior angina. Had also been seen in ED for evaluation. LHC with patent LIMA to LAD, SVG to OM known to be occluded, unchanged severe native vessel disease treated medically. RHC with preserved CI and normal filling pressures. He was started on low-dose Imdur  for chest pain.   He is here today for CHF follow-up. Continues to have episodes of chest pain. Reports his chest is tender with direct palpation. Not clearly brought on by physical exertion. He has been walking for 15-20 minutes a few days a week and  swims laps regularly.  Walking is sometimes limited by left hip pain which he attributes to arthritis. No orthopnea, PND or lower extremity edema.   Girlfriend helps with meds the best she can. Reports he has been adherent with medical therapy. Smoking 1/2 ppd, no ETOH or drugs.    Past Medical History:  Diagnosis Date   AICD (automatic cardioverter/defibrillator) present    Arthritis    CHF (congestive heart failure) (HCC)    Coronary artery disease    Defibrillator activation    Diabetes mellitus    Hypertension    Myocardial infarction [facility])    Sleep apnea    Stroke [facility])    Current Outpatient Medications  Medication Sig Dispense Refill   albuterol  (PROVENTIL ) (2.5 MG/3ML) 0.083% nebulizer solution Take 3 mLs (2.5 mg total) by nebulization every 6 (six) hours as needed for wheezing or shortness of breath. 150 mL 1   albuterol  (VENTOLIN  HFA) 108 (90 Base) MCG/ACT inhaler Inhale 1-2 puffs into the lungs every 6 (six) hours as needed for wheezing or shortness of breath. 1 each 0   apixaban  (ELIQUIS ) 5 MG TABS tablet Take 1 tablet (5 mg total) by mouth 2 (two) times daily. 180 tablet 3   aspirin  EC 81 MG tablet Take 81 mg by mouth daily. Swallow whole.     atorvastatin  (LIPITOR ) 80 MG tablet Take 1 tablet (80 mg total) by mouth daily. 90 tablet 3   budesonide -formoterol  (SYMBICORT ) 80-4.5 MCG/ACT inhaler Inhale 2 puffs into the lungs 2 (two) times daily. 1 each  5   Continuous Glucose Sensor (FREESTYLE LIBRE 3 PLUS SENSOR) MISC Change sensor every 15 days. 2 each 6   digoxin  (LANOXIN ) 0.125 MG tablet Take 1 tablet (0.125 mg total) by mouth daily. 30 tablet 3   empagliflozin  (JARDIANCE ) 25 MG TABS tablet Take 1 tablet (25 mg total) by mouth daily. 90 tablet 2   ezetimibe  (ZETIA ) 10 MG tablet Take 1 tablet (10 mg total) by mouth daily. 90 tablet 3   Furosemide  (FUROSCIX ) 80 MG/10ML CTKT Inject 80 mg into the skin as directed.     glucose blood test strip Use as instructed 100 each 12    ibuprofen  (ADVIL ) 600 MG tablet Take 600 mg by mouth every 6 (six) hours as needed.     insulin  glargine (LANTUS ) 100 UNIT/ML injection Inject 18 Units total into the skin 2 (two) times daily. 10 mL 5   isosorbide  mononitrate (IMDUR ) 30 MG 24 hr tablet Take 1 tablet (30 mg total) by mouth at bedtime. 30 tablet 3   nitroGLYCERIN  (NITROSTAT ) 0.4 MG SL tablet Place 0.4 mg under the tongue every 5 (five) minutes as needed for chest pain.     pantoprazole  (PROTONIX ) 40 MG tablet Take 1 tablet (40 mg total) by mouth daily. 30 tablet 3   potassium chloride  SA (KLOR-CON  M) 20 MEQ tablet Take 2 tablets (40 mEq total) by mouth daily. 60 tablet 3   sacubitril -valsartan  (ENTRESTO ) 24-26 MG Take 1 tablet by mouth 2 (two) times daily. 60 tablet 3   sotalol  (BETAPACE ) 160 MG tablet Take 1 tablet (160 mg total) by mouth 2 (two) times daily. 180 tablet 3   spironolactone  (ALDACTONE ) 25 MG tablet Take 0.5 tablets (12.5 mg total) by mouth daily. 45 tablet 3   torsemide  (DEMADEX ) 20 MG tablet Take 3 tablets (60 mg total) by mouth daily. 90 tablet 3   No current facility-administered medications for this encounter.   Allergies  Allergen Reactions   Victoza [Liraglutide] Other (See Comments)    Gave pt pancreatitis   Social History   Socioeconomic History   Marital status: Single    Spouse name: Not on file   Number of children: 3   Years of education: Not on file   Highest education level: Associate degree: academic program  Occupational History   Occupation: Disabilty  Tobacco Use   Smoking status: Some Days    Current packs/day: 0.00    Average packs/day: 0.5 packs/day for 35.0 years (17.5 ttl pk-yrs)    Types: Cigarettes    Start date: 65    Last attempt to quit: 06/30/2023    Years since quitting: 0.4   Smokeless tobacco: Not on file  Vaping Use   Vaping status: Never Used  Substance and Sexual Activity   Alcohol use: No   Drug use: Never   Sexual activity: Yes  Other Topics Concern   Not  on file  Social History Narrative   Not on file   Social Drivers of Health   Financial Resource Strain: Low Risk  (09/30/2023)   Overall Financial Resource Strain (CARDIA)    Difficulty of Paying Living Expenses: Not very hard  Food Insecurity: No Food Insecurity (12/08/2023)   Hunger Vital Sign    Worried About Running Out of Food in the Last Year: Never true    Ran Out of Food in the Last Year: Never true  Transportation Needs: No Transportation Needs (12/08/2023)   PRAPARE - Administrator, Civil Service (Medical): No  Lack of Transportation (Non-Medical): No  Physical Activity: Not on file  Stress: Not on file  Social Connections: Not on file  Intimate Partner Violence: Not At Risk (12/08/2023)   Humiliation, Afraid, Rape, and Kick questionnaire    Fear of Current or Ex-Partner: No    Emotionally Abused: No    Physically Abused: No    Sexually Abused: No    Family History  Problem Relation Age of Onset   Heart attack Mother    Heart attack Father    Heart attack Maternal Uncle    Wt Readings from Last 3 Encounters:  12/20/23 96.6 kg (213 lb)  12/17/23 97.5 kg (215 lb)  12/09/23 97 kg (213 lb 14.4 oz)   BP 119/71   Pulse 62   Wt 96.6 kg (213 lb)   SpO2 98%   BMI 31.45 kg/m   PHYSICAL EXAM: General:  Well appearing. Neck: no JVD Cor: Regular rate & rhythm. No rubs, gallops or murmurs. Lungs: clear Abdomen: soft, nontender, nondistended.  Extremities: no edema Neuro: alert & orientedx3. Affect pleasant    ASSESSMENT & PLAN: 1. Chronic systolic CHF due to iCM - s/p CABG in 2013 severe 3v CAD.  - R/LHC 12/24: LIMA to LAD patent. SVG-OM occluded. LCx fills from L-L collats -> med RX - RHC 12/24 RA 8 PA 57/22 (37) PCW 19 Fick 5.9/2.7 - EF previously as low as 25% in 2022 - Echo 12/24: EF 45-50%, grade III DD, RV okay - Echo 3/25: EF 30-35% - Admit 4/25 for volume overload - RHC 06/25: Normal filling pressures and preserved Fick CI. - NYHA II.  Volume looks good. Continue torsemide  60 mg daily - Continue Jardiance  25 mg daily  - Continue spiro 12.5 mg daily - Continue Toprol  XL 25 mg daily - Continue Entresto  24/26 mg bid. Repeat BMET in 1 week. Increase Entresto  at that time if Scr stable. Cr up from 1.1 > 1.5 a few days ago, suspect may have been d/t contrast. - Continue digoxin  0.125, dig level okay 06/10  - Echo today pending. - Plan CPX this summer to better assess HF limitation (order is in). Likely not transplant candidate with PAD, but can consider VAD if he can demonstrate compliance  2. CAD - CABG X 2 in 2013 with SVG to OM and LIMA to LAD - PCI to RCA in 2016 - LHC 12/24 occluded SVG to OM, patent LIMA to LAD, LIMA backfills the p LAD and its branches, also supplies collaterals to LCX, occluded p Cx, severe RCA disease. No substrate for revascularization.  - Stable CAD on [facility] 06/25. Started on Imdur  by his Cardiologist, Dr. Wendel - Hx of chronic atypical CP. Current chest pain does not appear cardiac, seems most likely musculoskeletal. - On asa + Atorvastatin  80/Zetia   3. PAF - SR on ECG 06/20. Regular on exam today. - Continue Eliquis  5 mg BID  4. VT/PVCs - S/p VT ablation in 05/24 at [facility] - Continue sotalol  - Followed by EP.  - Unable to interrogate device in clinic today  5. PAD - S/p left fempop in 05/2023 - now thrombosed - Followed by [facility]  6. OSA - CPAP machine broke - He has been referred back to Pulmonary but could not be reached for an appointment  Follow-up: 1 week for lab, 2 months with APP  Helayna Dun N, PA-C  1:08 PM

## 2023-12-22 ENCOUNTER — Other Ambulatory Visit (HOSPITAL_COMMUNITY): Payer: Self-pay

## 2023-12-23 ENCOUNTER — Other Ambulatory Visit: Payer: Self-pay

## 2023-12-24 ENCOUNTER — Ambulatory Visit: Admitting: Pharmacist

## 2023-12-24 ENCOUNTER — Telehealth: Payer: Self-pay | Admitting: Pharmacist

## 2023-12-24 NOTE — Telephone Encounter (Signed)
 Patient was referred to me for GLP-1 therapy.  In reviewing his chart in preparation for his visit I noticed that he had pancreatitis to Victoza.  His chart also says he has chronic pancreatitis which makes him a very poor candidate for GLP-1 therapy.  I called patient to discuss this and offered to cancel his appointment.  Although I did state if he had questions about his medications he be more than welcome to come in and we can discuss.  Patient stated that the last few nights his blood sugar drops to 50 or 60.  States blood sugar can be high 200s 300s and then drop.  Last night he did not take any Lantus  and his blood sugar was 160 this morning.  He was taking 15 units of Lantus  daily.  I advised that he decrease to 10 units but patient did not think that this was enough of a decrease.  Advised for him to take 5 units of Lantus  and keep track of his morning blood sugars.  Follow-up PCP about blood sugar. Patient opted to cancel his appointment with me today.

## 2023-12-24 NOTE — Progress Notes (Deleted)
 Patient ID: Michael Barnett                 DOB: Mar 17, 1971                    MRN: [id number]     HPI: Michael Barnett is a 53 y.o. male patient referred to pharmacy clinic by *** to initiate GLP1-RA therapy. PMH is significant for chronic systolic CHF/ICM s/p ICD, CAD s/p CABG (LIMA to LAD, SVG to OM) 2013 and PCI to RCA in 2016, VT s/p VT ablation in 05/24 on Sotalol , PAF, PAD s/p left fem-pop, OSA, CVA, poorly controlled DM II, recurrent dizziness/presyncope and obesity. Most recent BMI ***.  Pancreatitis form victoza Not a GLP1 candidate  Baseline weight and BMI: *** Current weight and BMI: *** Current meds that affect weight: ****  *** If diabetic and on insulin /sulfonylurea, can consider reducing dose to reduce risk of hypoglycemia  *** Follow-up visit  Assess % weight loss Assess adverse effects Missed doses  Diet:   Exercise:   Family History:   Social History:   Labs: Lab Results  Component Value Date   HGBA1C 8.5 (A) 10/11/2023    Wt Readings from Last 1 Encounters:  12/20/23 213 lb (96.6 kg)    BP Readings from Last 1 Encounters:  12/20/23 119/71   Pulse Readings from Last 1 Encounters:  12/20/23 62       Component Value Date/Time   CHOL 116 11/03/2023 1315   TRIG 93 11/03/2023 1315   HDL 27 (L) 11/03/2023 1315   CHOLHDL 4.3 11/03/2023 1315   VLDL 19 11/03/2023 1315   LDLCALC 70 11/03/2023 1315    Past Medical History:  Diagnosis Date   AICD (automatic cardioverter/defibrillator) present    Arthritis    CHF (congestive heart failure) (HCC)    Coronary artery disease    Defibrillator activation    Diabetes mellitus    Hypertension    Myocardial infarction (HCC)    Sleep apnea    Stroke [facility])     Current Outpatient Medications on File Prior to Visit  Medication Sig Dispense Refill   albuterol  (PROVENTIL ) (2.5 MG/3ML) 0.083% nebulizer solution Take 3 mLs (2.5 mg total) by nebulization every 6 (six) hours as needed for wheezing or  shortness of breath. 150 mL 1   albuterol  (VENTOLIN  HFA) 108 (90 Base) MCG/ACT inhaler Inhale 1-2 puffs into the lungs every 6 (six) hours as needed for wheezing or shortness of breath. 1 each 0   apixaban  (ELIQUIS ) 5 MG TABS tablet Take 1 tablet (5 mg total) by mouth 2 (two) times daily. 180 tablet 3   aspirin  EC 81 MG tablet Take 81 mg by mouth daily. Swallow whole.     atorvastatin  (LIPITOR ) 80 MG tablet Take 1 tablet (80 mg total) by mouth daily. 90 tablet 3   budesonide -formoterol  (SYMBICORT ) 80-4.5 MCG/ACT inhaler Inhale 2 puffs into the lungs 2 (two) times daily. 1 each 5   Continuous Glucose Sensor (FREESTYLE LIBRE 3 PLUS SENSOR) MISC Change sensor every 15 days. 2 each 6   digoxin  (LANOXIN ) 0.125 MG tablet Take 1 tablet (0.125 mg total) by mouth daily. 30 tablet 3   empagliflozin  (JARDIANCE ) 25 MG TABS tablet Take 1 tablet (25 mg total) by mouth daily. 90 tablet 2   ezetimibe  (ZETIA ) 10 MG tablet Take 1 tablet (10 mg total) by mouth daily. 90 tablet 3   Furosemide  (FUROSCIX ) 80 MG/10ML CTKT Inject 80 mg into the skin as directed.  glucose blood test strip Use as instructed 100 each 12   ibuprofen  (ADVIL ) 600 MG tablet Take 600 mg by mouth every 6 (six) hours as needed.     insulin  glargine (LANTUS ) 100 UNIT/ML injection Inject 18 Units total into the skin 2 (two) times daily. 10 mL 5   isosorbide  mononitrate (IMDUR ) 30 MG 24 hr tablet Take 1 tablet (30 mg total) by mouth at bedtime. 30 tablet 3   nitroGLYCERIN  (NITROSTAT ) 0.4 MG SL tablet Place 0.4 mg under the tongue every 5 (five) minutes as needed for chest pain.     pantoprazole  (PROTONIX ) 40 MG tablet Take 1 tablet (40 mg total) by mouth daily. 30 tablet 3   potassium chloride  SA (KLOR-CON  M) 20 MEQ tablet Take 2 tablets (40 mEq total) by mouth daily. 60 tablet 3   sacubitril -valsartan  (ENTRESTO ) 24-26 MG Take 1 tablet by mouth 2 (two) times daily. 60 tablet 3   sotalol  (BETAPACE ) 160 MG tablet Take 1 tablet (160 mg total) by  mouth 2 (two) times daily. 180 tablet 3   spironolactone  (ALDACTONE ) 25 MG tablet Take 0.5 tablets (12.5 mg total) by mouth daily. 45 tablet 3   torsemide  (DEMADEX ) 20 MG tablet Take 3 tablets (60 mg total) by mouth daily. 90 tablet 3   No current facility-administered medications on file prior to visit.    Allergies  Allergen Reactions   Victoza [Liraglutide] Other (See Comments)    Gave pt pancreatitis     Assessment/Plan:  1. Weight loss - Patient has not met goal of at least 5% of body weight loss with comprehensive lifestyle modifications alone in the past 3-6 months. Pharmacotherapy is appropriate to pursue as augmentation. Will start ***. Confirmed patient not ***pregnant and no personal or family history of medullary thyroid  carcinoma (MTC) or Multiple Endocrine Neoplasia syndrome type 2 (MEN 2). Injection technique reviewed at today's visit.  Advised patient on common side effects including nausea, diarrhea, dyspepsia, decreased appetite, and fatigue. Counseled patient on reducing meal size and how to titrate medication to minimize side effects. Counseled patient to call if intolerable side effects or if experiencing dehydration, abdominal pain, or dizziness. Patient will adhere to dietary modifications and will target at least 150 minutes of moderate intensity exercise weekly.   Follow up in 1 month via telephone for tolerability update and dose titration.   Dayshawn Irizarry D Narda Fundora, Pharm.D, BCACP, BCPS, CPP [facility] of [facility] [address] [place], [place] [address]  Phone: [phone]; Fax: [phone]

## 2023-12-27 ENCOUNTER — Other Ambulatory Visit (HOSPITAL_COMMUNITY)

## 2023-12-27 ENCOUNTER — Ambulatory Visit: Payer: Self-pay | Admitting: Physician Assistant

## 2023-12-27 ENCOUNTER — Encounter (HOSPITAL_COMMUNITY)

## 2023-12-27 ENCOUNTER — Ambulatory Visit (HOSPITAL_COMMUNITY)

## 2023-12-27 ENCOUNTER — Ambulatory Visit (HOSPITAL_COMMUNITY)
Admission: RE | Admit: 2023-12-27 | Discharge: 2023-12-27 | Disposition: A | Discharge status: Home / Self Care | Source: Ambulatory Visit | Attending: Cardiology | Admitting: Cardiology

## 2023-12-27 DIAGNOSIS — I509 Heart failure, unspecified: Secondary | ICD-10-CM | POA: Diagnosis not present

## 2023-12-27 DIAGNOSIS — I5022 Chronic systolic (congestive) heart failure: Secondary | ICD-10-CM

## 2023-12-27 LAB — BASIC METABOLIC PANEL WITH GFR
Anion gap: 8 (ref 5–15)
BUN: 18 mg/dL (ref 6–20)
CO2: 22 mmol/L (ref 22–32)
Calcium: 8.1 mg/dL — ABNORMAL LOW (ref 8.9–10.3)
Chloride: 105 mmol/L (ref 98–111)
Creatinine, Ser: 1.15 mg/dL (ref 0.61–1.24)
GFR, Estimated: >60 mL/min (ref >60)
Glucose, Bld: 156 mg/dL — ABNORMAL HIGH (ref 70–99)
Potassium: 3.8 mmol/L (ref 3.5–5.1)
Sodium: 135 mmol/L (ref 135–145)

## 2023-12-28 ENCOUNTER — Other Ambulatory Visit: Payer: Self-pay | Admitting: Internal Medicine

## 2023-12-28 ENCOUNTER — Encounter

## 2023-12-28 DIAGNOSIS — Z794 Long term (current) use of insulin: Secondary | ICD-10-CM

## 2024-01-07 NOTE — ED Provider Notes (Signed)
 ------------------------------------------------------------------------------- Attestation signed by Michael Arloa Hoover, MD at 01/08/2024  9:16 AM Patient was seen after full presentation from Michael Barnett, APP.  Please see my independent note documenting my face-to-face encounter with history and physical exam.  My independent note should serve as my attestation.  Patient's presentation is most consistent with acute presentation with potential threat to life or bodily function. Michael Barnett  -------------------------------------------------------------------------------  ED OBSERVATION Syncope Protocol  Shared visit with ED Attending with care assumed at 8:00 am with Dr Barnett.   COURSE Pt placed in Syncope observation status today at 0104 am by ED Attending. Notes, labs and studies reviewed. Pt remained stable overnight without complications.  Briefly, patient is a 53 y.o. male with a history of congestive heart failure, atrial fibrillation, coronary artery bypass grafting (CABG), previous heart attack, and stroke presenting with lightheadedness, chest pain, and low blood pressure.He reports he had gone down a flight of stairs and experienced a syncopal episode prompting him to proceed to the ED. His bp was noted to be low, he is prescribed bp meds but does not take them due to orthostatic hypotension. He was recently started on imdur  which he has been taking at night. He is followed by the heart failure clinic, under consideration for heart pump but patient has some reservations about this and additional surgeries after undergoing a cabg years ago. Workup in the ed was benign, dizziness and hypotension felt secondary to imdur  as this was recently added to his regimen. He was placed in cdu for monitoring and re-evaluation this morning. He reports he is feeling well this am, has been ambulating in the room without symptoms. Denies cp, sob, n/v. Has no complaints this am.   Past medical/surgical history,  social history, medications, allergies and FH have been reviewed with patient and/or in documentation.   Medical History[1] Surgical History[2]  Medications, Medication Allergies, Social History and Family hx have been reviewed as available.     Physical Examination BP 133/67   Pulse 67   Temp 98 F (36.7 C)   Resp 18   Ht 175.3 cm (5' 9)   Wt 94.7 kg (208 lb 12.8 oz)   SpO2 100%   BMI 30.83 kg/m  Constitutional: Patient in no distress Head/Neck: Atraumatic, normocephalic.  Supple, no masses  Eyes:  conjunctiva normal Respiratory: clear to auscultation CV: RRR Abdomen: nondistended MSK: nonpitting ble edema Skin: dry and intact without rashes or lesions Neuro:alert. Oriented x 4.    Labs Reviewed  COMPREHENSIVE METABOLIC PANEL - Abnormal      Result Value   Sodium 135 (*)    Potassium 3.9     Chloride 104     CO2 23     Anion Gap 8     Glucose, Random 242 (*)    Blood Urea Nitrogen (BUN) 20     Creatinine 1.05     eGFR 85     Albumin 3.8     Total Protein 6.4     Bilirubin, Total 1.2 (*)    Alkaline Phosphatase (ALP) 127 (*)    Aspartate Aminotransferase (AST) 17     Alanine Aminotransferase (ALT) 19     Calcium  8.5 (*)    BUN/Creatinine Ratio      URINALYSIS WITH REFLEX TO MICROSCOPIC - Abnormal   Color, Urine Yellow     Clarity, Urine Clear     Specific Gravity, Urine 1.030 (*)    pH, Urine 5.5     Protein, Urine Negative  Glucose, Urine >1000 (*)    Ketones, Urine Trace     Bilirubin, Urine Negative     Blood, Urine 1+ (*)    Nitrite, Urine Negative     Leukocyte Esterase, Urine Negative     Urobilinogen, Urine 2.0 (*)    WBC, Urine 0-5     RBC, Urine 3-5 (*)    Bacteria, Urine None Seen    CBC WITH DIFFERENTIAL - Abnormal   WBC 6.28     RBC 5.41     Hemoglobin 16.6     Hematocrit 50.3     Mean Corpuscular Volume (MCV) 93.0     Mean Corpuscular Hemoglobin (MCH) 30.7     Mean Corpuscular Hemoglobin Conc (MCHC) 33.0     Red Cell  Distribution Width (RDW) 15.8     Platelet Count (PLT) 110 (*)    Mean Platelet Volume (MPV) 10.4 (*)    Neutrophils % 61     Lymphocytes % 29     Monocytes % 8     Eosinophils % 2     Basophils % 1     Neutrophils Absolute 3.80     Lymphocytes # 1.80     Monocytes # 0.50     Eosinophils # 0.10     Basophils # 0.10    B-TYPE NATRIURETIC PEPTIDE (BNP) - Abnormal   B-Type Natriuretic Peptide (BNP) 346 (*)   POC GLUCOSE (AH) - Abnormal   Glucose, POC 283 (*)   TROPONIN, HIGH SENSITIVE X 2 (0 HOUR BASELINE + 2 HOUR) - Normal   Troponin, High Sensitive 16    TROPONIN, HIGH SENSITIVE, 2HR RFX - Normal   Troponin, High Sensitive 15    AH LACTATE, WHOLE BLOOD WITH 2 HOUR REFLEX (HP I-STAT MANUAL) - Normal   Lactate 1.47    DIGOXIN  LEVEL - Normal   Digoxin  Level 0.54     Narrative:    Digoxin -binding drugs (e.g. DigiFab, Digibind) interfere with measurement of Digoxin .  Digoxin  concentration measurements may not be accurate until digoxin -binding drugs are eliminated from the body.   POC GLUCOSE REQUEST  POC GLUCOSE REQUEST  POC GLUCOSE REQUEST  POC GLUCOSE REQUEST    Radiology Results (last 72 hours)     Procedure Component Value Units Date/Time   CT Head WO Contrast [[id number]] Collected: 01/06/24 1941   Order Status: Completed Updated: 01/06/24 1947   Narrative:     CLINICAL DATA:  Syncope/presyncope, lightheaded for 4-5 days.  EXAM: CT HEAD WITHOUT CONTRAST  TECHNIQUE: Contiguous axial images were obtained from the base of the skull through the vertex without intravenous contrast.  RADIATION DOSE REDUCTION: This exam was performed according to the departmental dose-optimization program which includes automated exposure control, adjustment of the mA and/or kV according to patient size and/or use of iterative reconstruction technique.  COMPARISON:  None Available.  FINDINGS: Brain: No acute intracranial hemorrhage. No CT evidence of acute infarct. No edema, mass  effect, or midline shift. The basilar cisterns are patent.  Ventricles: The ventricles are normal.  Vascular: Atherosclerotic calcifications of the carotid siphons and intracranial vertebral arteries. No hyperdense vessel.  Skull: No acute or aggressive finding.  Orbits: Orbits are symmetric.  Sinuses: Mucosal thickening greatest in the partially visualized right maxillary sinus.  Other: Right mastoid effusion.    Impression:     No CT evidence of acute intracranial abnormality.  Right maxillary sinus mucosal thickening.  Right mastoid effusion.   Electronically Signed   By: Donnice Mania  M.D.   On: 01/06/2024 19:45         EKG: Sinus rhythm with occasional Premature ventricular complexes  Nonspecific T wave changes  No previous ECGs available    [id number]:     The patient is currently in observation status in order to determine necessity of potential hospital admission. The patient presentation is consistent with acute presentation with potential threat to life or bodily function.   Michael Barnett is a 53 y.o. with a hx of  congestive heart failure, atrial fibrillation, coronary artery bypass grafting (CABG), previous heart attack, and stroke who presents to the ED with syncope.   Labs and radiographic study results have been reviewed.   DDx: arrhythmia, cardiomyopathy, SAH, ACS, PE, seizure, TIA, vertigo, metabolic disorder, intoxication, psychogenic pseudosyncope, vasovagal syncope, orthostatic syncope, med side effects  Recently started on imdur , hx of orthostatic hypotension likely cause of his episode. Poor tolerance of bp meds in the past. Recent echo 12/20/23 with ef of 35-40%. Recent cath 12/08/23.  Trop neg, EKG without ischemic changes making mi unlikely. Bnp at 346 but denies sob, edema making chf unlikely. Cbc without anemia or leukocytosis to suggest infection. Cmp without aki or electrolyte derangement. Dig level nml. UA with elevated sg  likely mild dehydration and given bolus in the ed. Likely contributing to episode earlier today. Ct head without acute findings.  Symptoms are consistent with/concerning for syncope likely secondary to hypotension, mild dehydration due to medication.   Based on the patient's presentation, Rudra Hobbins was placed in syncope observation status with plan for continued monitoring and reassessment of symptoms and for iv fluids, orthostatics, telemetry.  No acute EKG changes. Troponin negative x 2.      Determination of disposition will be based on symptoms, patient education, orthostatic bp, telemetry, ambulation trial.   I reviewed the patients EMR including Care Everywhere, the most recent admission and discharge summary, prior imaging studies and labs as well as specialty notes including 12/09/23 cardiac cath.   10:20 AM BP 133/67   Pulse 67   Temp 98 F (36.7 C)   Resp 18   Ht 175.3 cm (5' 9)   Wt 94.7 kg (208 lb 12.8 oz)   SpO2 100%   BMI 30.83 kg/m  Patient with recent cath and echo, followed by heart failure clinic in [place]. Recently started on imdur  due to chest pain. He reports hx of hypotension in the past and has been holding some of his bp meds due to this. Imdur  likely the source of recent hypotension and syncope, was held overnight and pt given 500 ml fluids. This morning he has no complaints, telemetry without acute findings. Orthostatics are negative and patient ambulated without symptoms. Advised him to hold imdur  for now and to contact HF clinic for ed follow up and adjustment of medications. He has some reservations about VAD and advised patient to express these concerns and goals of care with his primary cards/hf team. His vitals are stable, no complaints. All questions answered to patient satisfaction, return precautions given. Discharged home in stable condition.  Provider time spent in patient care today, inclusive of but not limited to clinical reassessment, review  of diagnostic studies, and discharge preparation, was greater than 30 minutes.     CLINICAL IMPRESSION:    1. Syncope, unspecified syncope type   2. Hypotension, unspecified hypotension type     This document was prepared using the Dragon voice recognition system.        [1]  Past Medical History: Diagnosis Date  . CHF (congestive heart failure)    (CMD)   . Coronary artery disease   . Diabetes mellitus    (CMD)   . Hypertension   [2] Past Surgical History: Procedure Laterality Date  . CARDIAC DEFIBRILLATOR PLACEMENT    . CORONARY ARTERY BYPASS GRAFT

## 2024-01-10 ENCOUNTER — Encounter

## 2024-01-19 NOTE — Progress Notes (Signed)
 Remote ICD transmission.

## 2024-01-24 ENCOUNTER — Encounter

## 2024-02-07 ENCOUNTER — Ambulatory Visit: Admitting: Podiatry

## 2024-02-07 ENCOUNTER — Encounter

## 2024-02-16 NOTE — Progress Notes (Incomplete)
 ADVANCED HF CLINIC NOTE   PCP: Vicci Barnie NOVAK, MD  HF Cardiologist: Dr. Cherrie  HPI: Michael Barnett is a 53 y.o. male history of chronic systolic CHF/ICM s/p ICD, CAD s/p CABG (LIMA to LAD, SVG to OM) 2013 and PCI to RCA in 2016, VT s/p VT ablation in 05/24 on Sotalol , PAF, PAD s/p left fem-pop, OSA, CVA, poorly controlled DM II, recurrent dizziness/presyncope felt to be noncardiac. Has history of chronic atypical chest pain.  Previously followed by Cardiology at [facility]. Had been followed by Advanced Heart Failure at OSH in past. Underwent evaluation for transplant but later declined d/t noncompliance. Established with Dr. Wendel 12/24.   EF previously 25% 09/22. EF improved to 40-45% on echo 05/24.   R/LHC 12/24: occluded SVG to OM, patent LIMA to LAD, LIMA backfills the p LAD and its branches, also supplies collaterals to LCX, occluded p Cx, severe RCA disease, elevated filling pressures with preserved Fick CO. Echo 12/24: EF 45-50%, grade III DD, RV okay  Admitted 1/25 following ICD shock. On device interrogation rhythm appeared to be Afib with RVR >> inappropriate shock.  Admitted 09/28/23 with acute on chronic CHF and chest pain.  Echo with EF 30-35%, RWMA, grade III DD. Cardiology consulted. He was diuresed and GDMT titrated.   Struggling with volume overload and medication non-compliance since discharge.   Seen for follow-up 06/10. Noting episodes of chest pain reminiscent of prior angina. Had also been seen in ED for evaluation. LHC with patent LIMA to LAD, SVG to OM known to be occluded, unchanged severe native vessel disease treated medically. RHC with preserved CI and normal filling pressures. He was started on low-dose Imdur  for chest pain.   He is here today for CHF follow-up. Continues to have episodes of chest pain. Reports his chest is tender with direct palpation. Not clearly brought on by physical exertion. He has been walking for 15-20 minutes a few days a week and  swims laps regularly.  Walking is sometimes limited by left hip pain which he attributes to arthritis. No orthopnea, PND or lower extremity edema.   Girlfriend helps with meds the best she can. Reports he has been adherent with medical therapy. Smoking 1/2 ppd, no ETOH or drugs.    Past Medical History:  Diagnosis Date   AICD (automatic cardioverter/defibrillator) present    Arthritis    CHF (congestive heart failure) (HCC)    Coronary artery disease    Defibrillator activation    Diabetes mellitus    Hypertension    Myocardial infarction [facility])    Sleep apnea    Stroke [facility])    Current Outpatient Medications  Medication Sig Dispense Refill   albuterol  (PROVENTIL ) (2.5 MG/3ML) 0.083% nebulizer solution Take 3 mLs (2.5 mg total) by nebulization every 6 (six) hours as needed for wheezing or shortness of breath. 150 mL 1   albuterol  (VENTOLIN  HFA) 108 (90 Base) MCG/ACT inhaler Inhale 1-2 puffs into the lungs every 6 (six) hours as needed for wheezing or shortness of breath. 1 each 0   apixaban  (ELIQUIS ) 5 MG TABS tablet Take 1 tablet (5 mg total) by mouth 2 (two) times daily. 180 tablet 3   aspirin  EC 81 MG tablet Take 81 mg by mouth daily. Swallow whole.     atorvastatin  (LIPITOR ) 80 MG tablet Take 1 tablet (80 mg total) by mouth daily. 90 tablet 3   budesonide -formoterol  (SYMBICORT ) 80-4.5 MCG/ACT inhaler Inhale 2 puffs into the lungs 2 (two) times daily. 1 each  5   Continuous Glucose Sensor (DEXCOM G6 SENSOR) MISC USE AS DIRECTED TO MONITOR GLUCOSE THREE TIMES DAILY 15 each 3   digoxin  (LANOXIN ) 0.125 MG tablet Take 1 tablet (0.125 mg total) by mouth daily. 30 tablet 3   empagliflozin  (JARDIANCE ) 25 MG TABS tablet Take 1 tablet (25 mg total) by mouth daily. 90 tablet 2   ezetimibe  (ZETIA ) 10 MG tablet Take 1 tablet (10 mg total) by mouth daily. 90 tablet 3   Furosemide  (FUROSCIX ) 80 MG/10ML CTKT Inject 80 mg into the skin as directed.     glucose blood test strip Use as instructed 100  each 12   ibuprofen  (ADVIL ) 600 MG tablet Take 600 mg by mouth every 6 (six) hours as needed.     insulin  glargine (LANTUS ) 100 UNIT/ML injection Inject 18 Units total into the skin 2 (two) times daily. 10 mL 5   isosorbide  mononitrate (IMDUR ) 30 MG 24 hr tablet Take 1 tablet (30 mg total) by mouth at bedtime. 30 tablet 3   nitroGLYCERIN  (NITROSTAT ) 0.4 MG SL tablet Place 0.4 mg under the tongue every 5 (five) minutes as needed for chest pain.     pantoprazole  (PROTONIX ) 40 MG tablet Take 1 tablet (40 mg total) by mouth daily. 30 tablet 3   potassium chloride  SA (KLOR-CON  M) 20 MEQ tablet Take 2 tablets (40 mEq total) by mouth daily. 60 tablet 3   sacubitril -valsartan  (ENTRESTO ) 24-26 MG Take 1 tablet by mouth 2 (two) times daily. 60 tablet 3   sotalol  (BETAPACE ) 160 MG tablet Take 1 tablet (160 mg total) by mouth 2 (two) times daily. 180 tablet 3   spironolactone  (ALDACTONE ) 25 MG tablet Take 0.5 tablets (12.5 mg total) by mouth daily. 45 tablet 3   torsemide  (DEMADEX ) 20 MG tablet Take 3 tablets (60 mg total) by mouth daily. 90 tablet 3   No current facility-administered medications for this visit.   Allergies  Allergen Reactions   Victoza [Liraglutide] Other (See Comments)    Gave pt pancreatitis   Social History   Socioeconomic History   Marital status: Single    Spouse name: Not on file   Number of children: 3   Years of education: Not on file   Highest education level: Associate degree: academic program  Occupational History   Occupation: Disabilty  Tobacco Use   Smoking status: Some Days    Current packs/day: 0.00    Average packs/day: 0.5 packs/day for 35.0 years (17.5 ttl pk-yrs)    Types: Cigarettes    Start date: 16    Last attempt to quit: 06/30/2023    Years since quitting: 0.6   Smokeless tobacco: Not on file  Vaping Use   Vaping status: Never Used  Substance and Sexual Activity   Alcohol use: No   Drug use: Never   Sexual activity: Yes  Other Topics Concern    Not on file  Social History Narrative   Not on file   Social Drivers of Health   Financial Resource Strain: Low Risk  (09/30/2023)   Overall Financial Resource Strain (CARDIA)    Difficulty of Paying Living Expenses: Not very hard  Food Insecurity: No Food Insecurity (12/08/2023)   Hunger Vital Sign    Worried About Running Out of Food in the Last Year: Never true    Ran Out of Food in the Last Year: Never true  Transportation Needs: No Transportation Needs (12/08/2023)   PRAPARE - Administrator, Civil Service (Medical): No  Lack of Transportation (Non-Medical): No  Physical Activity: Not on file  Stress: Not on file  Social Connections: Not on file  Intimate Partner Violence: Not At Risk (12/08/2023)   Humiliation, Afraid, Rape, and Kick questionnaire    Fear of Current or Ex-Partner: No    Emotionally Abused: No    Physically Abused: No    Sexually Abused: No    Family History  Problem Relation Age of Onset   Heart attack Mother    Heart attack Father    Heart attack Maternal Uncle    Wt Readings from Last 3 Encounters:  12/20/23 96.6 kg (213 lb)  12/17/23 97.5 kg (215 lb)  12/09/23 97 kg (213 lb 14.4 oz)   There were no vitals taken for this visit.  PHYSICAL EXAM: General:  Well appearing. Neck: no JVD Cor: Regular rate & rhythm. No rubs, gallops or murmurs. Lungs: clear Abdomen: soft, nontender, nondistended.  Extremities: no edema Neuro: alert & orientedx3. Affect pleasant    ASSESSMENT & PLAN: 1. Chronic systolic CHF due to iCM - s/p CABG in 2013 severe 3v CAD.  - R/LHC 12/24: LIMA to LAD patent. SVG-OM occluded. LCx fills from L-L collats -> med RX - RHC 12/24 RA 8 PA 57/22 (37) PCW 19 Fick 5.9/2.7 - EF previously as low as 25% in 2022 - Echo 12/24: EF 45-50%, grade III DD, RV okay - Echo 3/25: EF 30-35% - Admit 4/25 for volume overload - RHC 06/25: Normal filling pressures and preserved Fick CI. - NYHA II. Volume looks good. Continue  torsemide  60 mg daily - Continue Jardiance  25 mg daily  - Continue spiro 12.5 mg daily - Continue Toprol  XL 25 mg daily - Continue Entresto  24/26 mg bid. Repeat BMET in 1 week. Increase Entresto  at that time if Scr stable. Cr up from 1.1 > 1.5 a few days ago, suspect may have been d/t contrast. - Continue digoxin  0.125, dig level okay 06/10  - Echo today pending. - Plan CPX this summer to better assess HF limitation (order is in). Likely not transplant candidate with PAD, but can consider VAD if he can demonstrate compliance  2. CAD - CABG X 2 in 2013 with SVG to OM and LIMA to LAD - PCI to RCA in 2016 - LHC 12/24 occluded SVG to OM, patent LIMA to LAD, LIMA backfills the p LAD and its branches, also supplies collaterals to LCX, occluded p Cx, severe RCA disease. No substrate for revascularization.  - Stable CAD on [facility] 06/25. Started on Imdur  by his Cardiologist, Dr. Wendel - Hx of chronic atypical CP. Current chest pain does not appear cardiac, seems most likely musculoskeletal. - On asa + Atorvastatin  80/Zetia   3. PAF - SR on ECG 06/20. Regular on exam today. - Continue Eliquis  5 mg BID  4. VT/PVCs - S/p VT ablation in 05/24 at [facility] - Continue sotalol  - Followed by EP.  - Unable to interrogate device in clinic today  5. PAD - S/p left fempop in 05/2023 - now thrombosed - Followed by [facility]  6. OSA - CPAP machine broke - He has been referred back to Pulmonary but could not be reached for an appointment  Follow-up: 1 week for lab, 2 months with APP  Harlene CHRISTELLA Gainer, FNP  9:45 AM

## 2024-02-18 ENCOUNTER — Other Ambulatory Visit: Payer: Self-pay | Admitting: Internal Medicine

## 2024-02-18 ENCOUNTER — Telehealth: Payer: Self-pay

## 2024-02-18 ENCOUNTER — Encounter (HOSPITAL_COMMUNITY)

## 2024-02-18 NOTE — Telephone Encounter (Signed)
 Copied from CRM [id number]. Topic: Clinical - Medication Refill >> Feb 18, 2024 11:03 AM Zebedee SAUNDERS wrote: Medication: empagliflozin  (JARDIANCE ) 25 MG TABS tablet a spironolactone  ,(ALDACTONE ) 25 MG tablet, ezetimibe  (ZETIA ) 10 MG tablet, pantoprazole  (PROTONIX ) 40 MG tablet, Freestyle Libre 3 monitor.   Has the patient contacted their pharmacy? Yes (Agent: If no, request that the patient contact the pharmacy for the refill. If patient does not wish to contact the pharmacy document the reason why and proceed with request.) (Agent: If yes, when and what did the pharmacy advise?)  This is the patient's preferred pharmacy:   [facility] [address] [place], [place] [address] [phone] Is this the correct pharmacy for this prescription? Yes If no, delete pharmacy and type the correct one.   Has the prescription been filled recently? No  Is the patient out of the medication? Yes  Has the patient been seen for an appointment in the last year OR does the patient have an upcoming appointment? Yes  Can we respond through MyChart? No  Agent: Please be advised that Rx refills may take up to 3 business days. We ask that you follow-up with your pharmacy.

## 2024-02-18 NOTE — Telephone Encounter (Signed)
 Call to patient. Unable to reach VM left.

## 2024-02-21 ENCOUNTER — Encounter

## 2024-03-01 ENCOUNTER — Telehealth (HOSPITAL_COMMUNITY): Payer: Self-pay

## 2024-03-01 NOTE — Telephone Encounter (Signed)
 Called to confirm/remind patient of their appointment at the [facility] on 03/02/24.   Appointment:   [x] Confirmed  [] Left mess   [] No answer/No voice mail  [] VM Full/unable to leave message  [] Phone not in service  Patient reminded to bring all medications and/or complete list.  Confirmed patient has transportation. Gave directions, instructed to utilize valet parking.

## 2024-03-02 ENCOUNTER — Ambulatory Visit: Admitting: Podiatry

## 2024-03-02 ENCOUNTER — Encounter (HOSPITAL_COMMUNITY): Payer: Self-pay

## 2024-03-02 ENCOUNTER — Ambulatory Visit (INDEPENDENT_AMBULATORY_CARE_PROVIDER_SITE_OTHER)

## 2024-03-02 ENCOUNTER — Ambulatory Visit (HOSPITAL_COMMUNITY)
Admission: RE | Admit: 2024-03-02 | Discharge: 2024-03-02 | Disposition: A | Discharge status: Home / Self Care | Source: Ambulatory Visit | Attending: Cardiology | Admitting: Cardiology

## 2024-03-02 VITALS — BP 120/82 | HR 57 | Ht 69.0 in | Wt 219.8 lb

## 2024-03-02 DIAGNOSIS — M79671 Pain in right foot: Secondary | ICD-10-CM | POA: Diagnosis not present

## 2024-03-02 DIAGNOSIS — Z955 Presence of coronary angioplasty implant and graft: Secondary | ICD-10-CM | POA: Diagnosis not present

## 2024-03-02 DIAGNOSIS — R0789 Other chest pain: Secondary | ICD-10-CM | POA: Insufficient documentation

## 2024-03-02 DIAGNOSIS — I255 Ischemic cardiomyopathy: Secondary | ICD-10-CM | POA: Diagnosis not present

## 2024-03-02 DIAGNOSIS — Z794 Long term (current) use of insulin: Secondary | ICD-10-CM | POA: Insufficient documentation

## 2024-03-02 DIAGNOSIS — M79672 Pain in left foot: Secondary | ICD-10-CM | POA: Diagnosis not present

## 2024-03-02 DIAGNOSIS — E1151 Type 2 diabetes mellitus with diabetic peripheral angiopathy without gangrene: Secondary | ICD-10-CM | POA: Insufficient documentation

## 2024-03-02 DIAGNOSIS — G4733 Obstructive sleep apnea (adult) (pediatric): Secondary | ICD-10-CM | POA: Insufficient documentation

## 2024-03-02 DIAGNOSIS — F17210 Nicotine dependence, cigarettes, uncomplicated: Secondary | ICD-10-CM | POA: Diagnosis not present

## 2024-03-02 DIAGNOSIS — I110 Hypertensive heart disease with heart failure: Secondary | ICD-10-CM | POA: Insufficient documentation

## 2024-03-02 DIAGNOSIS — Z79899 Other long term (current) drug therapy: Secondary | ICD-10-CM | POA: Insufficient documentation

## 2024-03-02 DIAGNOSIS — I5022 Chronic systolic (congestive) heart failure: Secondary | ICD-10-CM | POA: Insufficient documentation

## 2024-03-02 DIAGNOSIS — I493 Ventricular premature depolarization: Secondary | ICD-10-CM | POA: Insufficient documentation

## 2024-03-02 DIAGNOSIS — Z7982 Long term (current) use of aspirin: Secondary | ICD-10-CM | POA: Diagnosis not present

## 2024-03-02 DIAGNOSIS — Z7901 Long term (current) use of anticoagulants: Secondary | ICD-10-CM | POA: Diagnosis not present

## 2024-03-02 DIAGNOSIS — Z91148 Patient's other noncompliance with medication regimen for other reason: Secondary | ICD-10-CM | POA: Diagnosis not present

## 2024-03-02 DIAGNOSIS — B351 Tinea unguium: Secondary | ICD-10-CM | POA: Diagnosis not present

## 2024-03-02 DIAGNOSIS — Z8673 Personal history of transient ischemic attack (TIA), and cerebral infarction without residual deficits: Secondary | ICD-10-CM | POA: Insufficient documentation

## 2024-03-02 DIAGNOSIS — Z91199 Patient's noncompliance with other medical treatment and regimen due to unspecified reason: Secondary | ICD-10-CM | POA: Insufficient documentation

## 2024-03-02 DIAGNOSIS — I480 Paroxysmal atrial fibrillation: Secondary | ICD-10-CM | POA: Diagnosis not present

## 2024-03-02 DIAGNOSIS — Z95810 Presence of automatic (implantable) cardiac defibrillator: Secondary | ICD-10-CM | POA: Diagnosis not present

## 2024-03-02 DIAGNOSIS — E1165 Type 2 diabetes mellitus with hyperglycemia: Secondary | ICD-10-CM | POA: Insufficient documentation

## 2024-03-02 DIAGNOSIS — Z7984 Long term (current) use of oral hypoglycemic drugs: Secondary | ICD-10-CM | POA: Insufficient documentation

## 2024-03-02 DIAGNOSIS — I4891 Unspecified atrial fibrillation: Secondary | ICD-10-CM | POA: Diagnosis not present

## 2024-03-02 DIAGNOSIS — R5382 Chronic fatigue, unspecified: Secondary | ICD-10-CM | POA: Insufficient documentation

## 2024-03-02 DIAGNOSIS — I70392 Other atherosclerosis of unspecified type of bypass graft(s) of the extremities, left leg: Secondary | ICD-10-CM | POA: Diagnosis not present

## 2024-03-02 DIAGNOSIS — Z951 Presence of aortocoronary bypass graft: Secondary | ICD-10-CM | POA: Diagnosis not present

## 2024-03-02 DIAGNOSIS — I25810 Atherosclerosis of coronary artery bypass graft(s) without angina pectoris: Secondary | ICD-10-CM | POA: Insufficient documentation

## 2024-03-02 LAB — BASIC METABOLIC PANEL WITH GFR
Anion gap: 8 (ref 5–15)
BUN: 15 mg/dL (ref 6–20)
CO2: 28 mmol/L (ref 22–32)
Calcium: 8.9 mg/dL (ref 8.9–10.3)
Chloride: 101 mmol/L (ref 98–111)
Creatinine, Ser: 1.02 mg/dL (ref 0.61–1.24)
GFR, Estimated: >60 mL/min (ref >60)
Glucose, Bld: 380 mg/dL — ABNORMAL HIGH (ref 70–99)
Potassium: 4.1 mmol/L (ref 3.5–5.1)
Sodium: 137 mmol/L (ref 135–145)

## 2024-03-02 LAB — BRAIN NATRIURETIC PEPTIDE: B Natriuretic Peptide: 987.4 pg/mL — ABNORMAL HIGH (ref 0.0–100.0)

## 2024-03-02 LAB — DIGOXIN LEVEL: Digoxin Level: <0.6 ng/mL — ABNORMAL LOW (ref 0.8–2.0)

## 2024-03-02 MED ORDER — SACUBITRIL-VALSARTAN 24-26 MG PO TABS: 1.0000 | ORAL_TABLET | Freq: Two times a day (BID) | ORAL | 11 refills | Status: AC

## 2024-03-02 MED ORDER — EMPAGLIFLOZIN 25 MG PO TABS: 25.0000 mg | ORAL_TABLET | Freq: Every day | ORAL | 2 refills | Status: AC

## 2024-03-02 NOTE — Patient Instructions (Addendum)
 Thank you for coming in today  If you had labs drawn today, any labs that are abnormal the clinic will call you No news is good news  Medications: Restart Jardiance  25 mg 1 tablet daily Retart Entresto  24/26 mg 1 tablet twice daily   Follow up appointments: Your physician recommends that you return for lab work in: 1 week you were given a prescription to give to your local lab.  Your physician recommends that you schedule a follow-up appointment in:  4 weeks in clinic 03/30/2024 at 11am   Do the following things EVERYDAY: Weigh yourself in the morning before breakfast. Write it down and keep it in a log. Take your medicines as prescribed Eat low salt foods--Limit salt (sodium) to 2000 mg per day.  Stay as active as you can everyday Limit all fluids for the day to less than 2 liters   At the [facility], you and your health needs are our priority. As part of our continuing mission to provide you with exceptional heart care, we have created designated Provider Care Teams. These Care Teams include your primary Cardiologist (physician) and Advanced Practice Providers (APPs- Physician Assistants and Nurse Practitioners) who all work together to provide you with the care you need, when you need it.   You may see any of the following providers on your designated Care Team at your next follow up: Dr Toribio Fuel Dr Ezra Shuck Dr. Ria Gardenia Greig Lenetta, NP Caffie Shed, [place] [facility] [place], [place] Beckey Coe, NP Tinnie Redman, PharmD   Please be sure to bring in all your medications bottles to every appointment.    Thank you for choosing [facility]  If you have any questions or concerns before your next appointment please send us  a message through [place] or call our office at [phone].    TO LEAVE A MESSAGE FOR THE NURSE SELECT OPTION 2, PLEASE LEAVE A MESSAGE INCLUDING: YOUR NAME DATE OF  BIRTH CALL BACK NUMBER REASON FOR CALL**this is important as we prioritize the call backs  YOU WILL RECEIVE A CALL BACK THE SAME DAY AS LONG AS YOU CALL BEFORE 4:00 PM

## 2024-03-02 NOTE — Progress Notes (Signed)
 Patient presents for evaluation and treatment of tenderness and some redness around nails feet.  Tenderness around toes with walking and wearing shoes.  Physical exam:  General appearance: Alert, pleasant, and in no acute distress.  Vascular: Pedal pulses: DP 0/4 B/L, PT 0/4 B/L.  Moderate to severe edema lower legs bilaterally  Neu  Dermatologic:  Nails thickened, disfigured, discolored 1-5 BL with subungual debris.  Redness and hypertrophic nail folds along nail folds bilaterally but no signs of drainage or infection.  Musculoskeletal:     Diagnosis: 1. Painful onychomycotic nails 1 through 5 bilaterally. 2. Pain toes 1 through 5 bilaterally. 3.  Diabetes mellitus type 2 with PVD  Plan: -Debrided onychomycotic nails 1 through 5 bilaterally.  Sharply debrided nails with nail clipper and reduced with a power bur.  Return 3 months [place]

## 2024-03-02 NOTE — Progress Notes (Signed)
 ADVANCED HF CLINIC NOTE   PCP: Vicci Barnie NOVAK, MD  HF Cardiologist: Dr. Cherrie  Reason for Visit: f/u for chronic systolic heart failure   HPI: Michael Barnett is a 53 y.o. male history of chronic systolic CHF/ICM s/p ICD, CAD s/p CABG (LIMA to LAD, SVG to OM) 2013 and PCI to RCA in 2016, VT s/p VT ablation in 05/24 on Sotalol , PAF, PAD s/p left fem-pop, OSA, CVA, poorly controlled DM II, recurrent dizziness/presyncope felt to be noncardiac. Has history of chronic atypical chest pain.  Previously followed by Cardiology at [facility]. Had been followed by Advanced Heart Failure at OSH in past. Underwent evaluation for transplant but later declined d/t noncompliance. Established with Dr. Wendel 12/24.   EF previously 25% 09/22. EF improved to 40-45% on echo 05/24.   R/LHC 12/24: occluded SVG to OM, patent LIMA to LAD, LIMA backfills the p LAD and its branches, also supplies collaterals to LCX, occluded p Cx, severe RCA disease, elevated filling pressures with preserved Fick CO. Echo 12/24: EF 45-50%, grade III DD, RV okay  Admitted 1/25 following ICD shock. On device interrogation rhythm appeared to be Afib with RVR >> inappropriate shock.  Admitted 09/28/23 with acute on chronic CHF and chest pain.  Echo with EF 30-35%, RWMA, grade III DD. Cardiology consulted. He was diuresed and GDMT titrated.   Struggling with volume overload and medication non-compliance since discharge.   Seen for follow-up 06/10. Noting episodes of chest pain reminiscent of prior angina. Had also been seen in ED for evaluation. LHC with patent LIMA to LAD, SVG to OM known to be occluded, unchanged severe native vessel disease treated medically. RHC with preserved CI and normal filling pressures. He was started on low-dose Imdur  for chest pain.   Echo 7/25 EF 35-40%, RV mod reduced  CPX 7/2:  Exercise testing with gas exchange shows a severely reduced functional capacity. The breathing reserve is normal. There is  evidence of severe chronotropic incompetence. Although the VE/VCO2 slope is only mildly elevated, the peak VO2, VO2 at the ventilatory threshold and OUES are all suggestive of a severe HF limitation.   Seen 01/06/24 at [facility] ED for LOC. W/u including head CT and observation on telemetry unremarkable. Troponins negative. Syncope felt due to orthostatic hypotension. He was given IVFs in the ED and Imdur  was discontinued.  He presents today for f/u. Reports doing better. Keeping off of Imdur . Denies any further syncope/near syncope. BP 120/82. Reports occasional self limiting CP but not frequent. He has 1+ b/l LEE on exam. Reports being out of Jardiance  and spironolactone  for several wks. He has been taking Entresto  but ran out yesterday. Despite CPX, he denies significant subjective dyspnea w/ ADLs. He reports NYHA Class II symptoms. He does endorse chronic fatigue. He is noncompliant w/ CPAP.        Past Medical History:  Diagnosis Date   AICD (automatic cardioverter/defibrillator) present    Arthritis    CHF (congestive heart failure) (HCC)    Coronary artery disease    Defibrillator activation    Diabetes mellitus    Hypertension    Myocardial infarction [facility])    Sleep apnea    Stroke [facility])    Current Outpatient Medications  Medication Sig Dispense Refill   albuterol  (PROVENTIL ) (2.5 MG/3ML) 0.083% nebulizer solution Take 3 mLs (2.5 mg total) by nebulization every 6 (six) hours as needed for wheezing or shortness of breath. 150 mL 1   albuterol  (VENTOLIN  HFA) 108 (90 Base)  MCG/ACT inhaler Inhale 1-2 puffs into the lungs every 6 (six) hours as needed for wheezing or shortness of breath. 1 each 0   apixaban  (ELIQUIS ) 5 MG TABS tablet Take 1 tablet (5 mg total) by mouth 2 (two) times daily. 180 tablet 3   aspirin  EC 81 MG tablet Take 81 mg by mouth daily. Swallow whole.     atorvastatin  (LIPITOR ) 80 MG tablet Take 1 tablet (80 mg total) by mouth daily. 90 tablet 3    budesonide -formoterol  (SYMBICORT ) 80-4.5 MCG/ACT inhaler Inhale 2 puffs into the lungs 2 (two) times daily. 1 each 5   Continuous Glucose Sensor (DEXCOM G6 SENSOR) MISC USE AS DIRECTED TO MONITOR GLUCOSE THREE TIMES DAILY 15 each 3   digoxin  (LANOXIN ) 0.125 MG tablet Take 1 tablet (0.125 mg total) by mouth daily. 30 tablet 3   empagliflozin  (JARDIANCE ) 25 MG TABS tablet Take 1 tablet (25 mg total) by mouth daily. 90 tablet 2   ezetimibe  (ZETIA ) 10 MG tablet Take 1 tablet (10 mg total) by mouth daily. 90 tablet 3   Furosemide  (FUROSCIX ) 80 MG/10ML CTKT Inject 80 mg into the skin as directed.     glucose blood test strip Use as instructed 100 each 12   ibuprofen  (ADVIL ) 600 MG tablet Take 600 mg by mouth every 6 (six) hours as needed.     insulin  glargine (LANTUS ) 100 UNIT/ML injection Inject 18 Units total into the skin 2 (two) times daily. 10 mL 5   isosorbide  mononitrate (IMDUR ) 30 MG 24 hr tablet Take 1 tablet (30 mg total) by mouth at bedtime. 30 tablet 3   nitroGLYCERIN  (NITROSTAT ) 0.4 MG SL tablet Place 0.4 mg under the tongue every 5 (five) minutes as needed for chest pain.     oxyCODONE -acetaminophen  (PERCOCET) 10-325 MG tablet Take 1 tablet q 4-6 hrs po PRN for severe pain     pantoprazole  (PROTONIX ) 40 MG tablet Take 1 tablet (40 mg total) by mouth daily. 30 tablet 3   potassium chloride  SA (KLOR-CON  M) 20 MEQ tablet Take 2 tablets (40 mEq total) by mouth daily. 60 tablet 3   sacubitril -valsartan  (ENTRESTO ) 24-26 MG Take 1 tablet by mouth 2 (two) times daily. 60 tablet 3   sotalol  (BETAPACE ) 160 MG tablet Take 1 tablet (160 mg total) by mouth 2 (two) times daily. 180 tablet 3   spironolactone  (ALDACTONE ) 25 MG tablet Take 0.5 tablets (12.5 mg total) by mouth daily. 45 tablet 3   torsemide  (DEMADEX ) 20 MG tablet Take 3 tablets (60 mg total) by mouth daily. 90 tablet 3   No current facility-administered medications for this encounter.   Allergies  Allergen Reactions   Victoza  [Liraglutide] Other (See Comments)    Gave pt pancreatitis   Social History   Socioeconomic History   Marital status: Single    Spouse name: Not on file   Number of children: 3   Years of education: Not on file   Highest education level: Associate degree: academic program  Occupational History   Occupation: Disabilty  Tobacco Use   Smoking status: Some Days    Current packs/day: 0.00    Average packs/day: 0.5 packs/day for 35.0 years (17.5 ttl pk-yrs)    Types: Cigarettes    Start date: 17    Last attempt to quit: 06/30/2023    Years since quitting: 0.6   Smokeless tobacco: Not on file  Vaping Use   Vaping status: Never Used  Substance and Sexual Activity   Alcohol use: No  Drug use: Never   Sexual activity: Yes  Other Topics Concern   Not on file  Social History Narrative   Not on file   Social Drivers of Health   Financial Resource Strain: Low Risk  (09/30/2023)   Overall Financial Resource Strain (CARDIA)    Difficulty of Paying Living Expenses: Not very hard  Food Insecurity: No Food Insecurity (12/08/2023)   Hunger Vital Sign    Worried About Running Out of Food in the Last Year: Never true    Ran Out of Food in the Last Year: Never true  Transportation Needs: No Transportation Needs (12/08/2023)   PRAPARE - Administrator, Civil Service (Medical): No    Lack of Transportation (Non-Medical): No  Physical Activity: Not on file  Stress: Not on file  Social Connections: Not on file  Intimate Partner Violence: Not At Risk (12/08/2023)   Humiliation, Afraid, Rape, and Kick questionnaire    Fear of Current or Ex-Partner: No    Emotionally Abused: No    Physically Abused: No    Sexually Abused: No    Family History  Problem Relation Age of Onset   Heart attack Mother    Heart attack Father    Heart attack Maternal Uncle    Wt Readings from Last 3 Encounters:  03/02/24 99.7 kg (219 lb 12.8 oz)  12/20/23 96.6 kg (213 lb)  12/17/23 97.5 kg (215 lb)    BP 120/82   Pulse (!) 57   Ht 5' 9 (1.753 m)   Wt 99.7 kg (219 lb 12.8 oz)   SpO2 98%   BMI 32.46 kg/m  GENERAL: NAD Lungs- clear  CARDIAC:  JVP: 8 cm          Normal rate with regular rhythm. 1 + b/l LE pitting edema  ABDOMEN: Soft, non-tender, non-distended.  EXTREMITIES: Warm and well perfused.  NEUROLOGIC: No obvious FND     ASSESSMENT & PLAN: 1. Chronic systolic CHF due to iCM - s/p CABG in 2013 severe 3v CAD.  - R/LHC 12/24: LIMA to LAD patent. SVG-OM occluded. LCx fills from L-L collats -> med RX - RHC 12/24 RA 8 PA 57/22 (37) PCW 19 Fick 5.9/2.7 - EF previously as low as 25% in 2022 - Echo 12/24: EF 45-50%, grade III DD, RV okay - Echo 3/25: EF 30-35% - Echo 6/25 EF 35-40%, RV mod reduced  - RHC 06/25: Normal filling pressures and preserved Fick CI. - CPX 7/25: evidence of severe chronotropic incompetence and severe HF limitation  - Despite CXR, pt subjectively feels better than study suggest, per his report - NYHA II. Volume overloaded on exam  - Restart Jardiance  25 mg daily  - Restart Entresto  24-26 mg bid - Plan to start back Spiro next visit - Continue digoxin  0.125. Check dig level  - Continue torsemide  60 mg daily - Check BMP today and again in 7 days  - Likely not transplant candidate with PAD, but can consider VAD in the future if he can demonstrate better compliance  2. CAD - CABG X 2 in 2013 with SVG to OM and LIMA to LAD - PCI to RCA in 2016 - LHC 12/24 occluded SVG to OM, patent LIMA to LAD, LIMA backfills the p LAD and its branches, also supplies collaterals to LCX, occluded p Cx, severe RCA disease. No substrate for revascularization.  - Stable CAD on [facility] 06/25. - Hx of chronic atypical CP. - On asa + Atorvastatin  80/Zetia  - keep off  on Imdur  given h/o   3. PAF - SR on ECG 06/20. Regular on exam today. - Continue Eliquis  5 mg BID  4. VT/PVCs - S/p VT ablation in 05/24 at [facility] - Continue sotalol  - Followed by EP.  - Unable to  interrogate device in clinic today  5. PAD - S/p left fempop in 05/2023 - now thrombosed - Followed by [facility]  6. OSA - CPAP machine broke - He has been referred back to Pulmonary but could not be reached for an appointment  F/u w/ APP in 4 wks to reassess volume status and titrate up GDMT   Caffie Shed, PA-C  3:30 PM

## 2024-03-03 ENCOUNTER — Ambulatory Visit (HOSPITAL_COMMUNITY): Payer: Self-pay | Admitting: Cardiology

## 2024-03-06 ENCOUNTER — Encounter

## 2024-03-08 LAB — CUP PACEART REMOTE DEVICE CHECK
Battery Remaining Longevity: 10 mo
Battery Remaining Percentage: 10 %
Brady Statistic RV Percent Paced: 1 %
Date Time Interrogation Session: 20250909094900
HighPow Impedance: 52 Ohm
Implantable Lead Connection Status: 753985
Implantable Lead Location: 753860
Implantable Lead Model: 295
Implantable Lead Serial Number: 120138
Lead Channel Impedance Value: 425 Ohm
Lead Channel Pacing Threshold Amplitude: 0.8 V
Lead Channel Pacing Threshold Pulse Width: 0.5 ms
Lead Channel Setting Pacing Amplitude: 2.5 V
Lead Channel Setting Pacing Pulse Width: 0.5 ms
Lead Channel Setting Sensing Sensitivity: 0.5 mV
Pulse Gen Serial Number: 103678
Zone Setting Status: 755011

## 2024-03-10 ENCOUNTER — Telehealth: Payer: Self-pay | Admitting: Internal Medicine

## 2024-03-10 ENCOUNTER — Ambulatory Visit: Admitting: Internal Medicine

## 2024-03-10 NOTE — Telephone Encounter (Signed)
 Called patient for No Show, No answer. Unable to leave voicemail.

## 2024-03-10 NOTE — Progress Notes (Signed)
Remote ICD Transmission.

## 2024-03-15 ENCOUNTER — Ambulatory Visit: Payer: Self-pay | Admitting: Cardiovascular Disease

## 2024-03-29 ENCOUNTER — Telehealth (HOSPITAL_COMMUNITY): Payer: Self-pay

## 2024-03-29 NOTE — Telephone Encounter (Signed)
 Called to confirm/remind patient of their appointment at the [facility] on 03/30/24.  However, [place] out [place] and Pt's care will be managed by another cardiology's office per pt's request. Our office's fax number was provided at [phone].

## 2024-03-30 ENCOUNTER — Encounter (HOSPITAL_COMMUNITY)

## 2024-05-01 IMAGING — CT CT L SPINE W/O CM
3 series · 13 of 33 positions shown, 16 images · IV contrast (Omni 300)
Comparison: CT Abdomen, and Pelvis today are reported separately.

CLINICAL DATA: 50-year-old male with pain.

EXAM:
CT LUMBAR SPINE WITH CONTRAST
TECHNIQUE: 
TECHNIQUE: Multiplanar CT images of the lumbar spine were
reconstructed from contemporary CT of the Abdomen and Pelvis.
RADIATION DOSE REDUCTION: This exam was performed according to the
departmental dose-optimization program which includes automated
exposure control, adjustment of the mA and/or kV according to
patient size and/or use of iterative reconstruction technique.
CONTRAST:  No additional

[Series 10: l-spine soft · axial · 0.36mm/px · z∈[+822,+1004]mm · 5 of 133 slices shown, 7 images]
[im 21/133  soft-tissue]
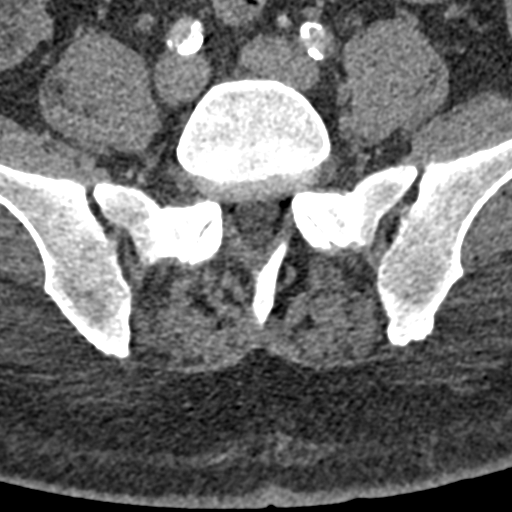
[im 21/133  bone]
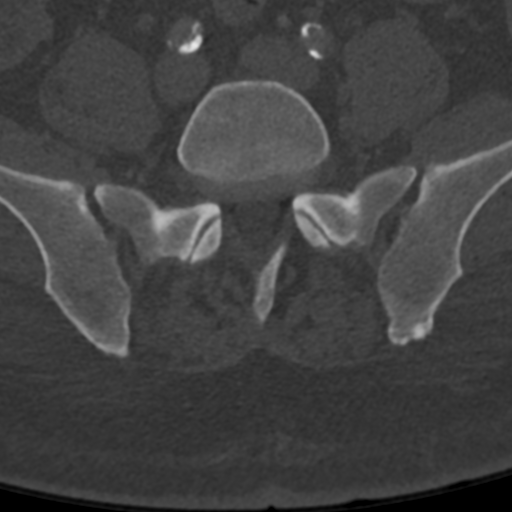
[im 41/133  bone]
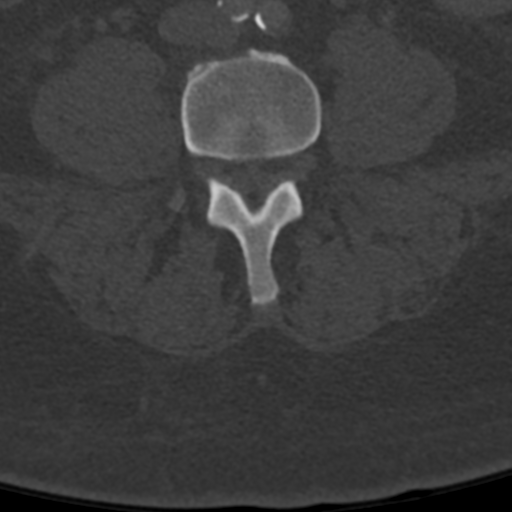
[im 72/133  bone]
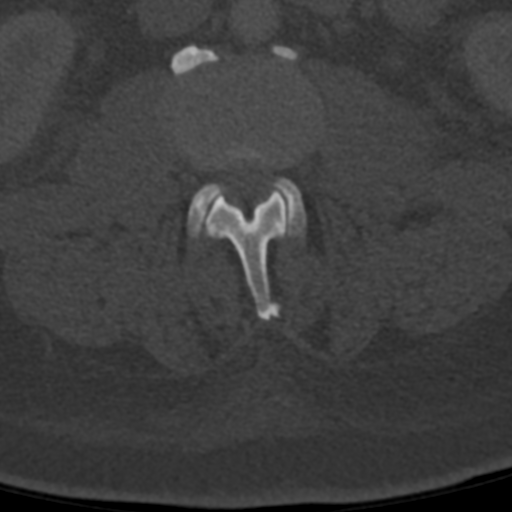
[im 92/133  bone]
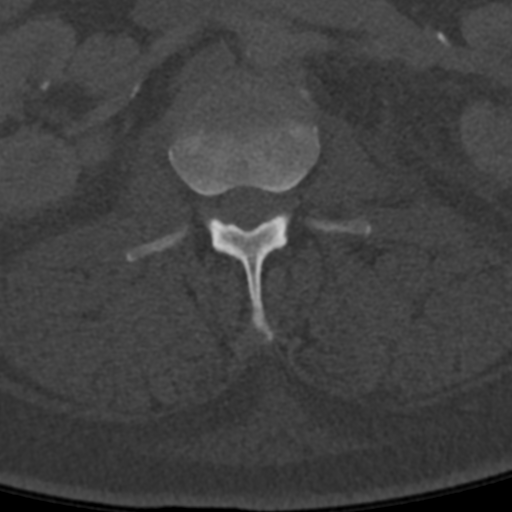
[im 112/133  soft-tissue]
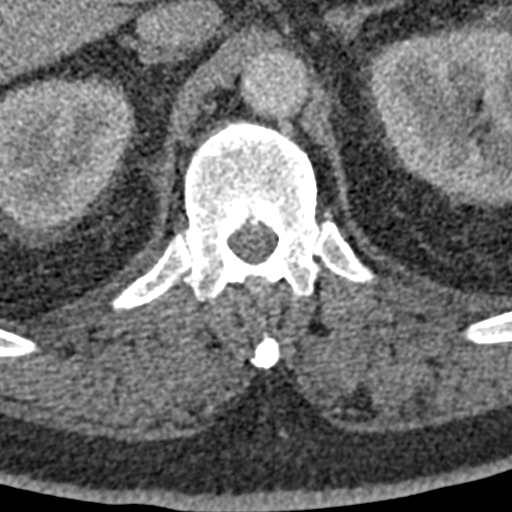
[im 112/133  bone]
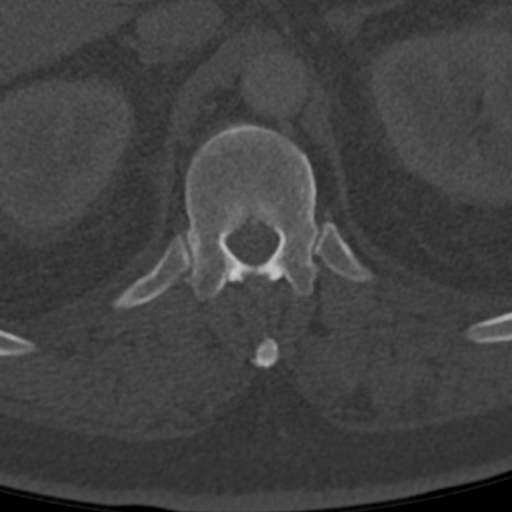

[Series 11: l-spine cor · coronal · 0.36mm/px · 3 of 97 slices shown]
[im 20/97  bone]
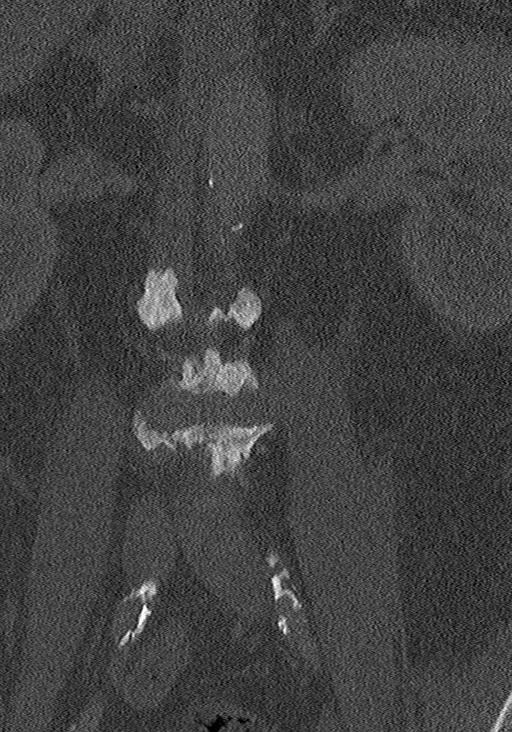
[im 39/97  bone]
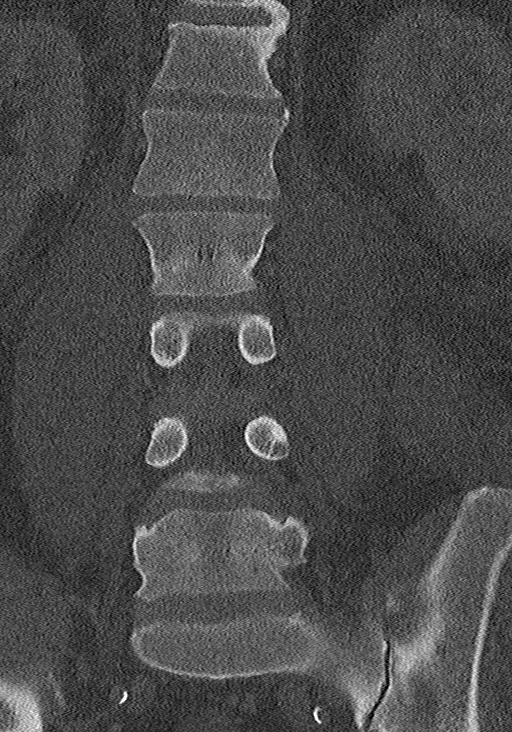
[im 58/97  bone]
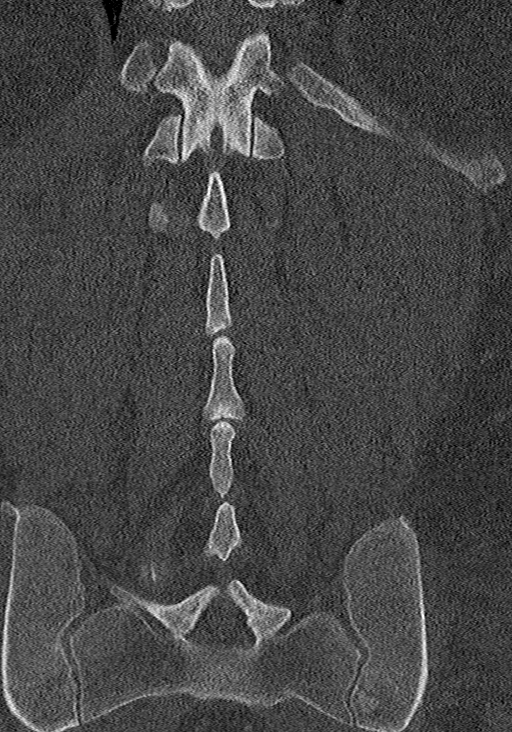

[Series 12: l-spine sag · sagittal · 0.45mm/px · 5 of 81 slices shown, 6 images]
[im 27/81  bone]
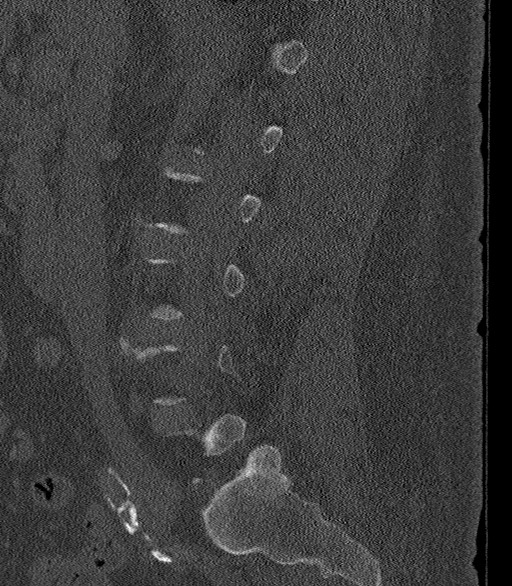
[im 34/81  bone]
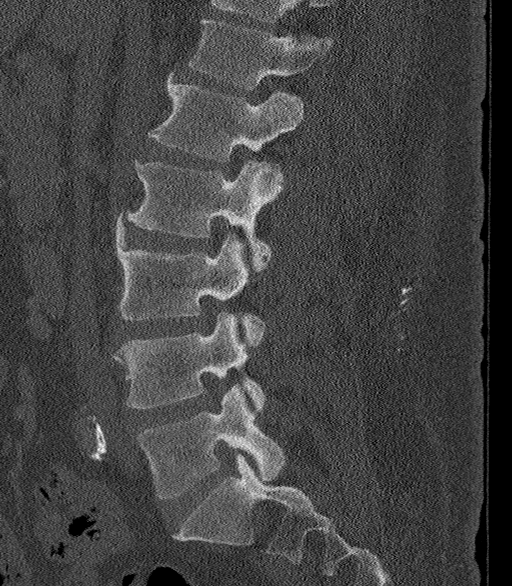
[im 41/81  soft-tissue]
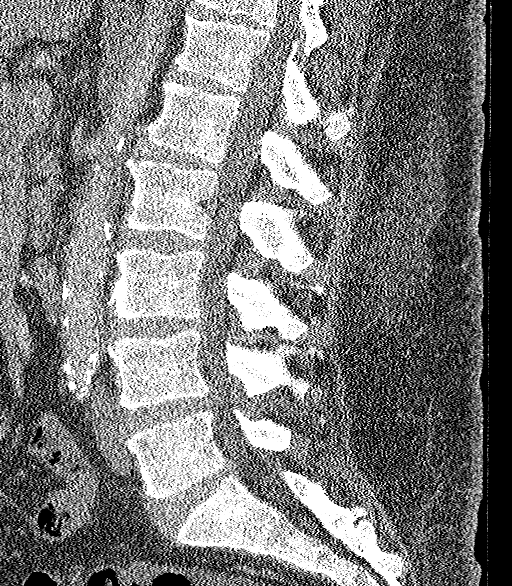
[im 41/81  bone]
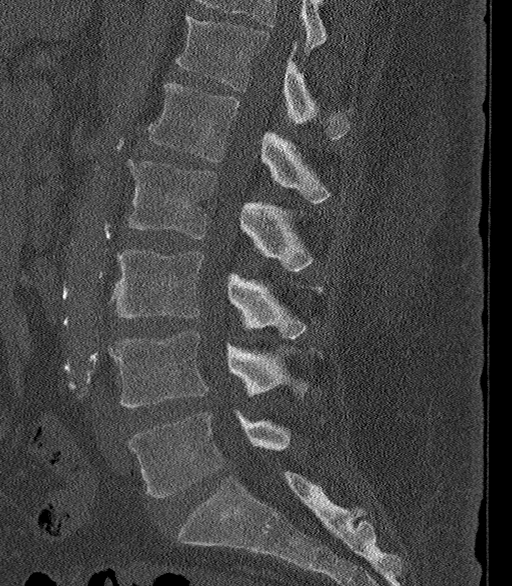
[im 47/81  bone]
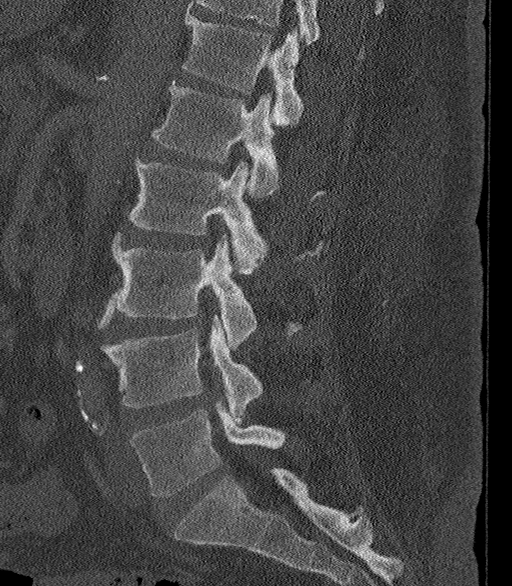
[im 54/81  bone]
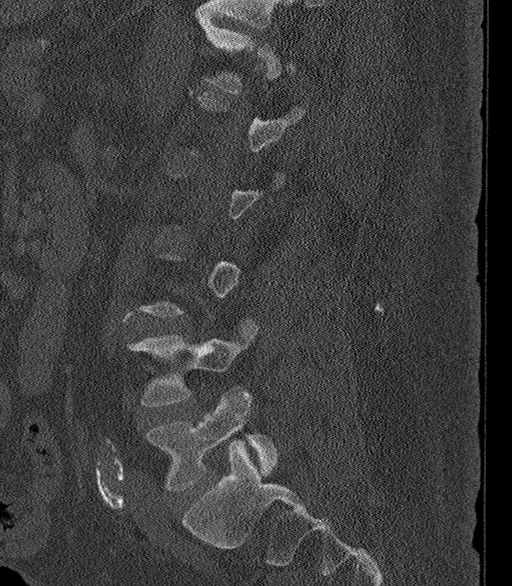

[13 of 33 positions shown; findings below may reference images not displayed]

FINDINGS: Segmentation: Normal.

Alignment: Preserved lumbar lordosis. No spondylolisthesis. Subtle
dextroconvex lumbar scoliosis.

Vertebrae: Chronic degenerative endplate changes in the lower
thoracic and lumbar spine. Background bone mineralization within
normal limits. No acute osseous abnormality identified. Intact
visible sacrum.

Paraspinal and other soft tissues: Abdominal and pelvic viscera are
reported separately. Lumbar paraspinal soft tissues are within
normal limits.

Disc levels:

T11-T12: Circumferential disc osteophyte complex but anterior
interbody ankylosis from flowing endplate osteophytes on sagittal
image 52. Still, possible mild spinal stenosis here.

T12-L1: Mostly far lateral disc and endplate degeneration with no
definite stenosis.

L1-L2: Mostly far lateral disc bulging and endplate spurring without
stenosis.

L2-L3: Mild circumferential disc bulge. Mild facet and ligament
flavum hypertrophy. Borderline to mild spinal stenosis.

L3-L4: Mild circumferential disc bulge, posterior element
hypertrophy. Borderline to mild spinal and bilateral L3 foraminal
stenosis.

L4-L5: Broad-based posterior disc disc with underlying
circumferential disc bulge and endplate spurring. Mild to moderate
facet and ligament flavum hypertrophy. Moderate to severe spinal
stenosis suspected with probable bilateral lateral recess stenosis.
Mild if any L4 foraminal stenosis.

L5-S1: Mild circumferential disc bulge. Moderate facet hypertrophy.
Mild bilateral L5 foraminal stenosis.
IMPRESSION: 1. No acute osseous abnormality in the lumbar spine. Interbody
ankylosis at T11-T12.

2. Widespread lower thoracic and lumbar spine degeneration.
Moderate to severe degenerative spinal and bilateral lateral recess
stenosis suspected at L4-L5 related to broad-based posterior disc
and facet hypertrophy. Query L4 and/or L5 radiculitis.
Borderline to mild spinal stenosis at both L2-L3 and L3-L4.

3. CT Abdomen and Pelvis today reported separately.

## 2024-05-01 IMAGING — CT CT ABD-PELV W/ CM
2 of 5 series · 17 of 46 positions shown, 19 images · IV contrast (Omni 300)
Comparison: None Available.

CLINICAL DATA: Abdominal pain

EXAM:
CT ABDOMEN AND PELVIS WITH CONTRAST
TECHNIQUE: Multidetector CT imaging of the abdomen and pelvis was performed
using the standard protocol following bolus administration of
intravenous contrast.

[Series 3: a/p w/ 5mm · axial · 0.98mm/px · z∈[+659,+1094]mm · 14 of 97 slices shown, 16 images]
[im 5/97  soft-tissue]
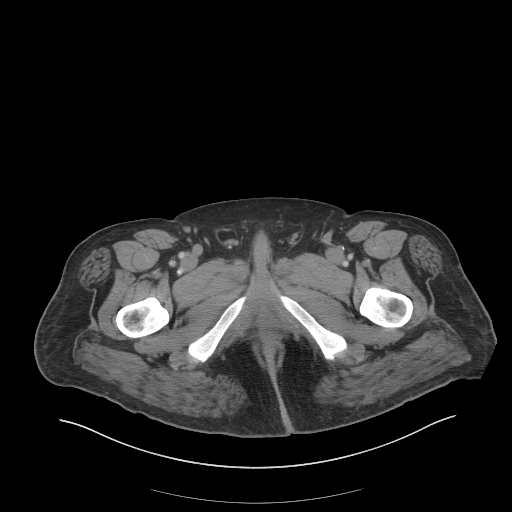
[im 5/97  bone]
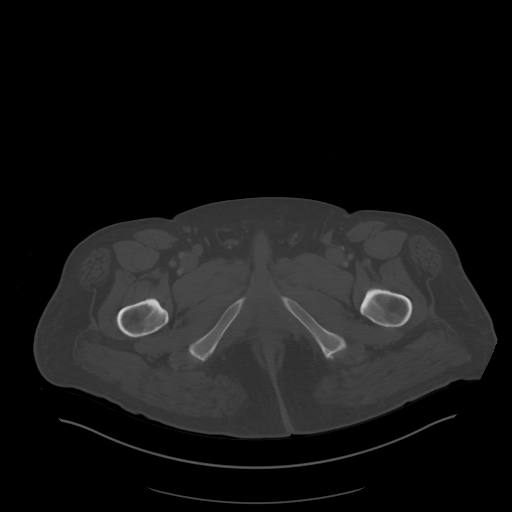
[im 15/97  soft-tissue]
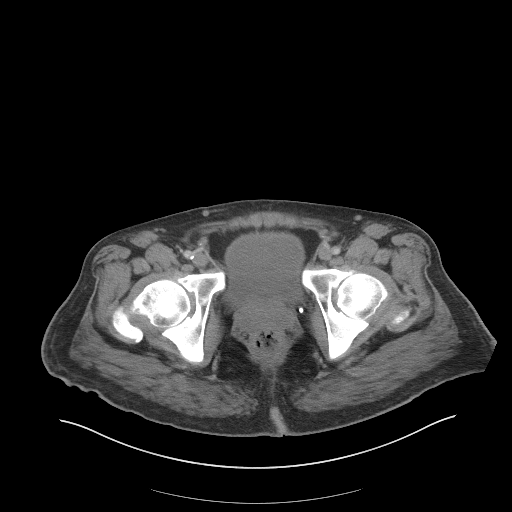
[im 20/97  soft-tissue]
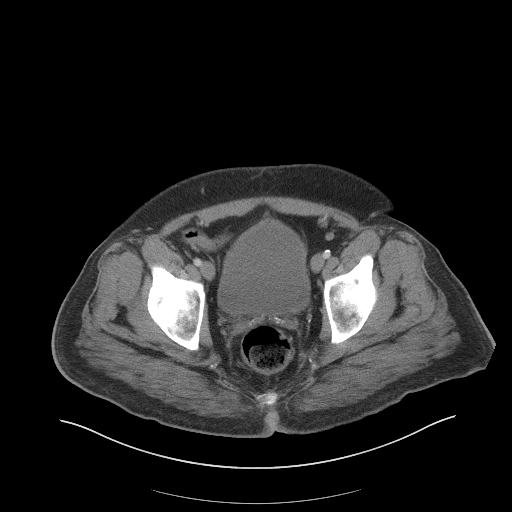
[im 25/97  soft-tissue]
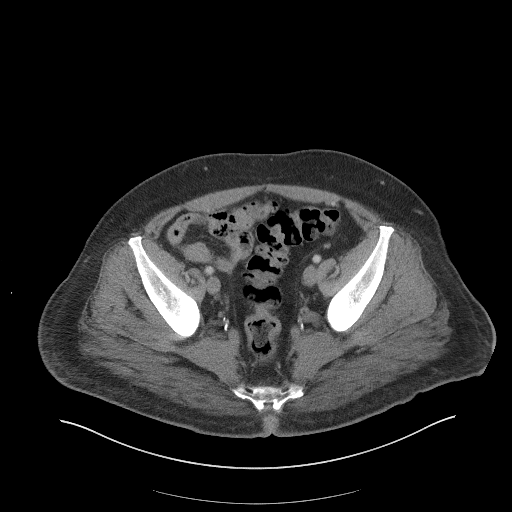
[im 34/97  soft-tissue]
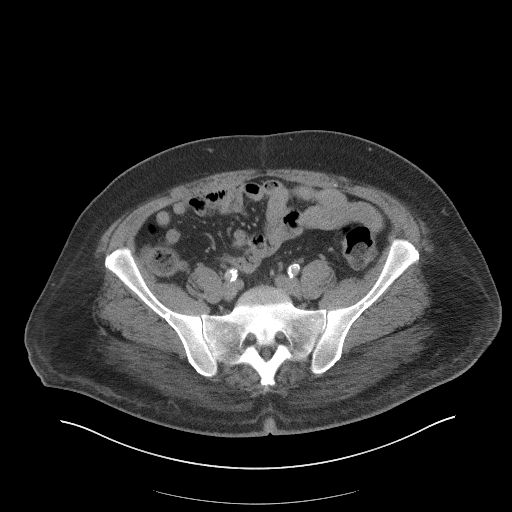
[im 39/97  soft-tissue]
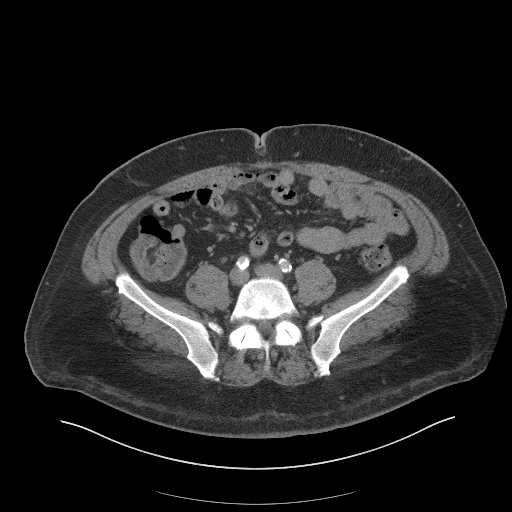
[im 44/97  soft-tissue]
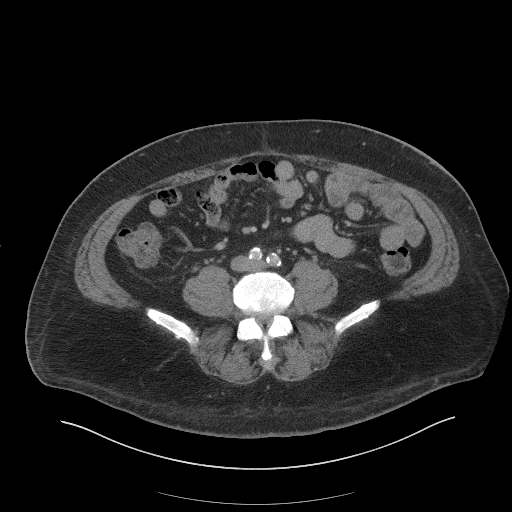
[im 53/97  soft-tissue]
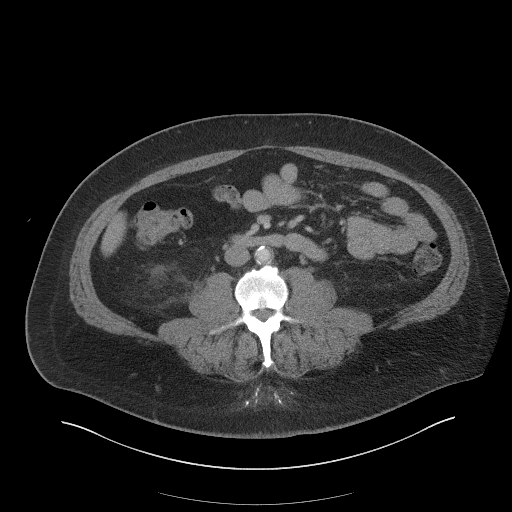
[im 58/97  soft-tissue]
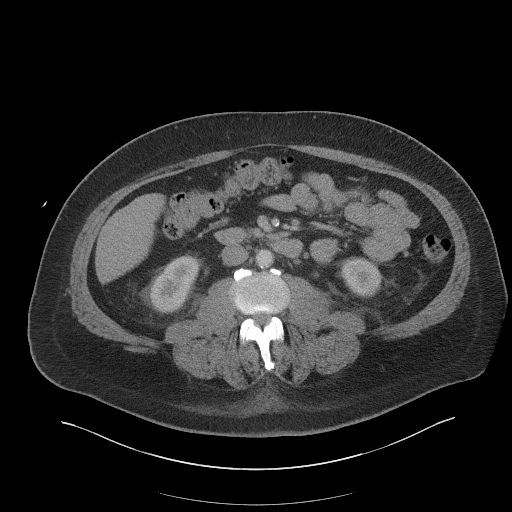
[im 58/97  bone]
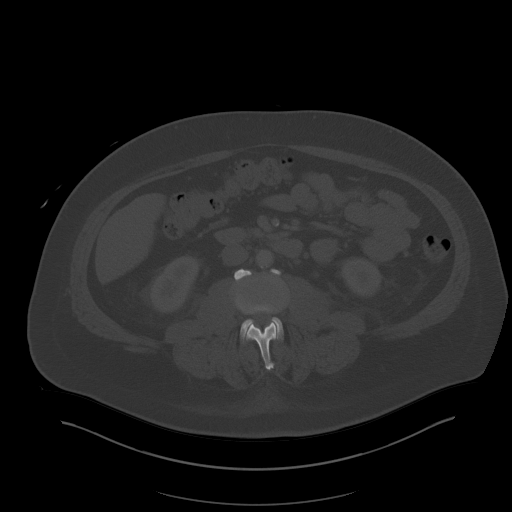
[im 63/97  soft-tissue]
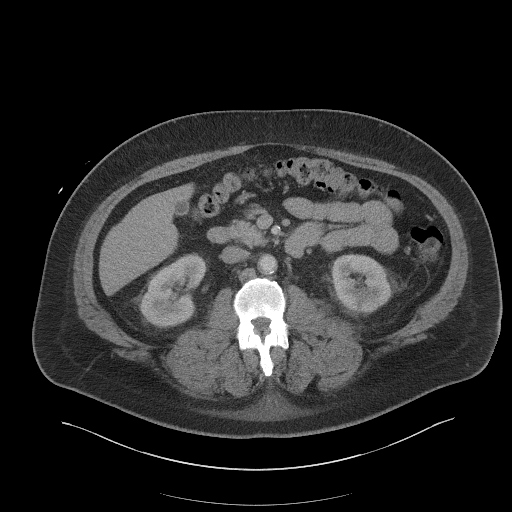
[im 73/97  soft-tissue]
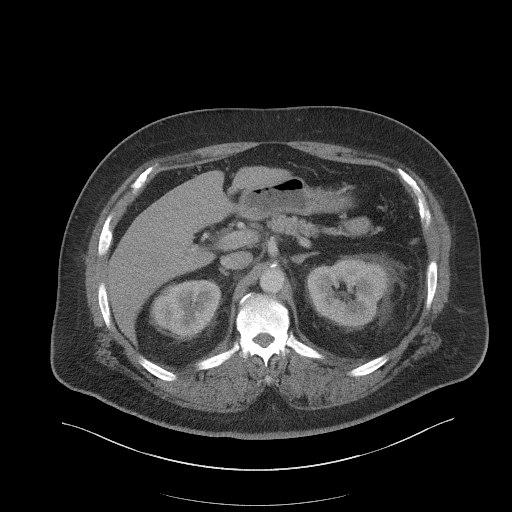
[im 77/97  soft-tissue]
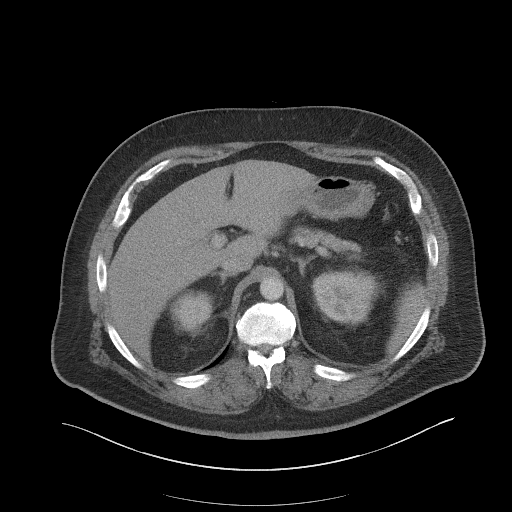
[im 82/97  soft-tissue]
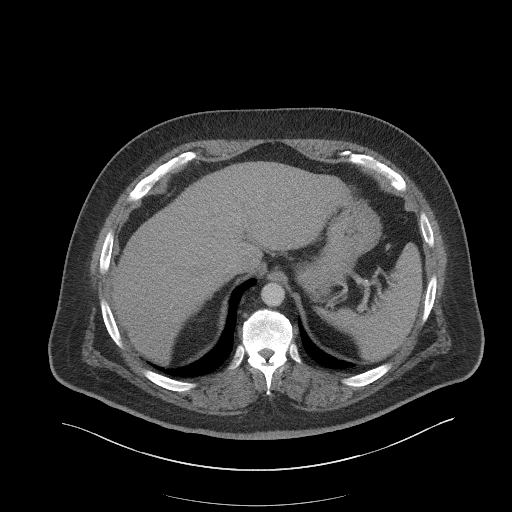
[im 92/97  soft-tissue]
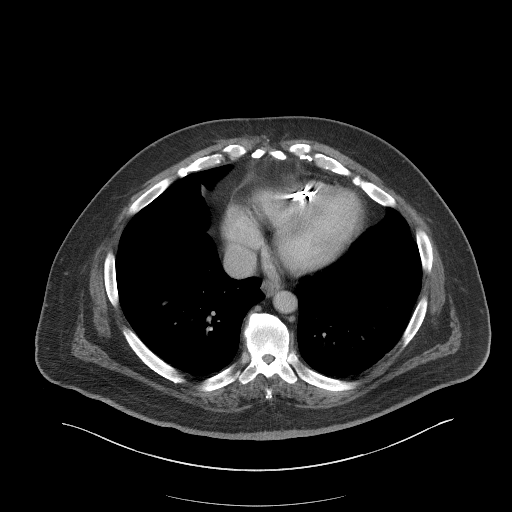

[Series 7: a/p w/ cor · coronal · 0.91mm/px · 3 of 151 slices shown]
[im 51/151  soft-tissue]
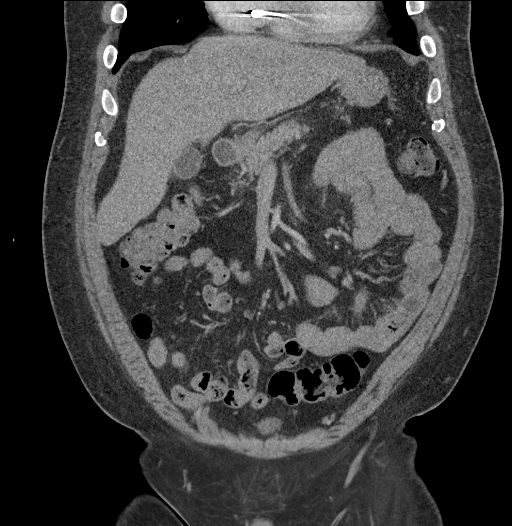
[im 67/151  soft-tissue]
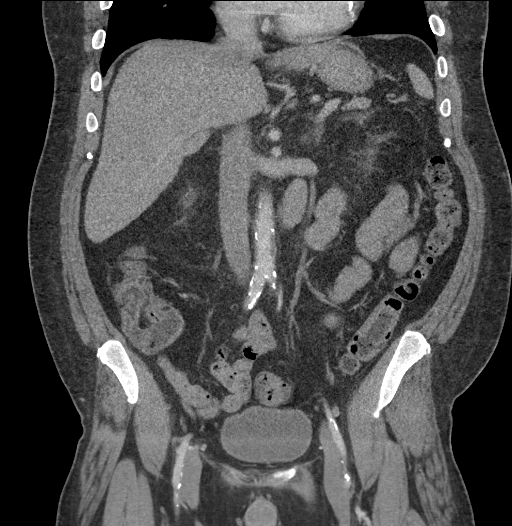
[im 84/151  soft-tissue]
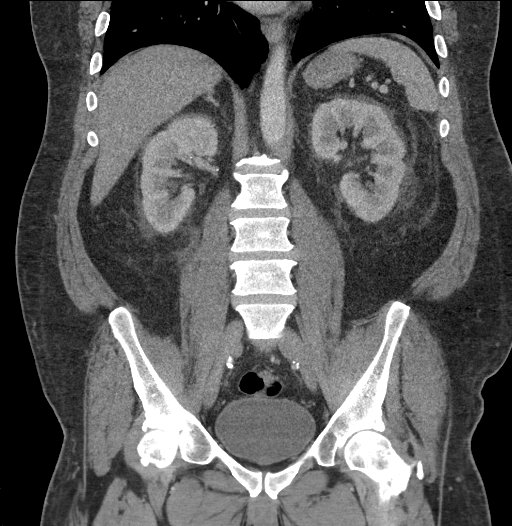

[17 of 46 positions shown; findings below may reference images not displayed]

RADIATION DOSE REDUCTION: This exam was performed according to the
departmental dose-optimization program which includes automated
exposure control, adjustment of the mA and/or kV according to
patient size and/or use of iterative reconstruction technique.

CONTRAST:  100mL OMNIPAQUE IOHEXOL 300 MG/ML  SOLN
FINDINGS: Lower chest: No acute abnormality.

Hepatobiliary: No focal liver abnormality is seen. No gallstones,
gallbladder wall thickening, or biliary dilatation.

Pancreas: Unremarkable. No pancreatic ductal dilatation or
surrounding inflammatory changes.

Spleen: Normal in size without focal abnormality.

Adrenals/Urinary Tract: Kidneys enhance symmetrically. No
hydronephrosis. Renal vascular calcifications with no definite
nephrolithiasis. Nonspecific symmetric perirenal fat stranding.
Bladder is unremarkable.

Stomach/Bowel: Stomach is within normal limits. Appendix appears
normal. No evidence of bowel wall thickening, distention, or
inflammatory changes.

Vascular/Lymphatic: Aortic atherosclerosis. No enlarged abdominal or
pelvic lymph nodes.

Reproductive: Prostatomegaly.

Other: Small fat containing right inguinal hernia. No free
intraperitoneal fluid or free air.

Musculoskeletal: No acute or significant osseous findings.
IMPRESSION: 1. No acute findings in the abdomen or pelvis, including no evidence
of acute pancreatitis.
2.  Aortic Atherosclerosis ([id number]).

## 2024-05-05 ENCOUNTER — Telehealth: Payer: Self-pay | Admitting: Emergency Medicine

## 2024-05-05 ENCOUNTER — Encounter: Payer: Self-pay | Admitting: Emergency Medicine

## 2024-05-05 NOTE — Telephone Encounter (Signed)
 A certified letter has been sent to the patient following three unsuccessful attempts (10/21, 10/29, 11/5) to reach him by phone. Contact was made to schedule an appointment due to patient being overdue since 01/2024 for 6 month follow up.

## 2024-06-01 ENCOUNTER — Encounter

## 2024-06-01 ENCOUNTER — Ambulatory Visit: Admitting: Podiatry

## 2024-06-06 ENCOUNTER — Encounter

## 2024-06-20 ENCOUNTER — Encounter (HOSPITAL_COMMUNITY)

## 2024-06-20 ENCOUNTER — Ambulatory Visit: Admitting: Vascular Surgery

## 2024-06-27 ENCOUNTER — Ambulatory Visit: Admitting: Vascular Surgery

## 2024-06-27 ENCOUNTER — Encounter (HOSPITAL_COMMUNITY)

## 2024-08-31 ENCOUNTER — Encounter

## 2024-09-05 ENCOUNTER — Encounter

## 2024-11-30 ENCOUNTER — Encounter

## 2024-12-05 ENCOUNTER — Encounter

## 2025-03-01 ENCOUNTER — Encounter

## 2025-03-06 ENCOUNTER — Encounter

## 2025-05-31 ENCOUNTER — Encounter
